# Patient Record
Sex: Female | Born: 1965 | Race: White | Hispanic: No | Marital: Single | Smoking: Current every day smoker
Health system: Southern US, Community
[De-identification: ages and names within clinical notes are randomized; demographics above are authoritative.]

## PROBLEM LIST (undated history)

## (undated) DIAGNOSIS — R296 Repeated falls: Secondary | ICD-10-CM

## (undated) DIAGNOSIS — R768 Other specified abnormal immunological findings in serum: Secondary | ICD-10-CM

## (undated) DIAGNOSIS — K760 Fatty (change of) liver, not elsewhere classified: Secondary | ICD-10-CM

## (undated) DIAGNOSIS — F319 Bipolar disorder, unspecified: Secondary | ICD-10-CM

## (undated) DIAGNOSIS — F1020 Alcohol dependence, uncomplicated: Secondary | ICD-10-CM

## (undated) DIAGNOSIS — B1920 Unspecified viral hepatitis C without hepatic coma: Secondary | ICD-10-CM

## (undated) DIAGNOSIS — D649 Anemia, unspecified: Secondary | ICD-10-CM

## (undated) DIAGNOSIS — E46 Unspecified protein-calorie malnutrition: Secondary | ICD-10-CM

## (undated) DIAGNOSIS — F1910 Other psychoactive substance abuse, uncomplicated: Secondary | ICD-10-CM

## (undated) DIAGNOSIS — J449 Chronic obstructive pulmonary disease, unspecified: Secondary | ICD-10-CM

## (undated) HISTORY — PX: TUBAL LIGATION: SHX77

---

## 1997-08-01 ENCOUNTER — Inpatient Hospital Stay (HOSPITAL_COMMUNITY): Admission: AD | Admit: 1997-08-01 | Discharge: 1997-08-01 | Payer: Self-pay | Admitting: *Deleted

## 1997-10-07 ENCOUNTER — Ambulatory Visit (HOSPITAL_COMMUNITY): Admission: RE | Admit: 1997-10-07 | Discharge: 1997-10-07 | Payer: Self-pay | Admitting: *Deleted

## 1998-03-20 ENCOUNTER — Inpatient Hospital Stay (HOSPITAL_COMMUNITY): Admission: AD | Admit: 1998-03-20 | Discharge: 1998-03-22 | Payer: Self-pay | Admitting: *Deleted

## 1998-04-25 DIAGNOSIS — F1910 Other psychoactive substance abuse, uncomplicated: Secondary | ICD-10-CM

## 1998-04-25 DIAGNOSIS — F1020 Alcohol dependence, uncomplicated: Secondary | ICD-10-CM

## 1998-04-25 HISTORY — DX: Alcohol dependence, uncomplicated: F10.20

## 1998-04-25 HISTORY — DX: Other psychoactive substance abuse, uncomplicated: F19.10

## 1998-09-26 ENCOUNTER — Emergency Department (HOSPITAL_COMMUNITY): Admission: EM | Admit: 1998-09-26 | Discharge: 1998-09-26 | Payer: Self-pay | Admitting: Emergency Medicine

## 1998-10-13 ENCOUNTER — Emergency Department (HOSPITAL_COMMUNITY): Admission: EM | Admit: 1998-10-13 | Discharge: 1998-10-13 | Payer: Self-pay | Admitting: Emergency Medicine

## 1999-04-13 ENCOUNTER — Emergency Department (HOSPITAL_COMMUNITY): Admission: EM | Admit: 1999-04-13 | Discharge: 1999-04-13 | Payer: Self-pay | Admitting: *Deleted

## 1999-04-13 ENCOUNTER — Encounter: Payer: Self-pay | Admitting: Emergency Medicine

## 2002-10-28 ENCOUNTER — Emergency Department (HOSPITAL_COMMUNITY): Admission: EM | Admit: 2002-10-28 | Discharge: 2002-10-28 | Payer: Self-pay | Admitting: Emergency Medicine

## 2002-10-28 ENCOUNTER — Encounter: Payer: Self-pay | Admitting: Emergency Medicine

## 2002-11-18 ENCOUNTER — Emergency Department (HOSPITAL_COMMUNITY): Admission: EM | Admit: 2002-11-18 | Discharge: 2002-11-18 | Payer: Self-pay

## 2003-01-26 ENCOUNTER — Emergency Department (HOSPITAL_COMMUNITY): Admission: EM | Admit: 2003-01-26 | Discharge: 2003-01-26 | Payer: Self-pay | Admitting: Emergency Medicine

## 2003-06-06 ENCOUNTER — Emergency Department (HOSPITAL_COMMUNITY): Admission: EM | Admit: 2003-06-06 | Discharge: 2003-06-06 | Payer: Self-pay | Admitting: Emergency Medicine

## 2003-06-24 ENCOUNTER — Encounter (INDEPENDENT_AMBULATORY_CARE_PROVIDER_SITE_OTHER): Payer: Self-pay | Admitting: Specialist

## 2003-06-24 ENCOUNTER — Ambulatory Visit (HOSPITAL_BASED_OUTPATIENT_CLINIC_OR_DEPARTMENT_OTHER): Admission: RE | Admit: 2003-06-24 | Discharge: 2003-06-24 | Payer: Self-pay | Admitting: Otolaryngology

## 2003-06-24 ENCOUNTER — Ambulatory Visit (HOSPITAL_COMMUNITY): Admission: RE | Admit: 2003-06-24 | Discharge: 2003-06-24 | Payer: Self-pay | Admitting: Otolaryngology

## 2003-06-24 HISTORY — PX: IRRIGATION AND DEBRIDEMENT SEBACEOUS CYST: SHX5255

## 2004-07-18 ENCOUNTER — Emergency Department (HOSPITAL_COMMUNITY): Admission: EM | Admit: 2004-07-18 | Discharge: 2004-07-18 | Payer: Self-pay | Admitting: Emergency Medicine

## 2004-08-20 ENCOUNTER — Inpatient Hospital Stay (HOSPITAL_COMMUNITY): Admission: EM | Admit: 2004-08-20 | Discharge: 2004-08-23 | Payer: Self-pay | Admitting: Psychiatry

## 2004-08-20 ENCOUNTER — Ambulatory Visit: Payer: Self-pay | Admitting: *Deleted

## 2005-03-04 ENCOUNTER — Emergency Department (HOSPITAL_COMMUNITY): Admission: EM | Admit: 2005-03-04 | Discharge: 2005-03-04 | Payer: Self-pay | Admitting: Family Medicine

## 2005-04-19 ENCOUNTER — Emergency Department (HOSPITAL_COMMUNITY): Admission: EM | Admit: 2005-04-19 | Discharge: 2005-04-20 | Payer: Self-pay | Admitting: Emergency Medicine

## 2005-05-11 ENCOUNTER — Emergency Department (HOSPITAL_COMMUNITY): Admission: EM | Admit: 2005-05-11 | Discharge: 2005-05-11 | Payer: Self-pay | Admitting: Family Medicine

## 2005-07-28 ENCOUNTER — Emergency Department (HOSPITAL_COMMUNITY): Admission: EM | Admit: 2005-07-28 | Discharge: 2005-07-28 | Payer: Self-pay | Admitting: Family Medicine

## 2005-09-21 ENCOUNTER — Inpatient Hospital Stay (HOSPITAL_COMMUNITY): Admission: RE | Admit: 2005-09-21 | Discharge: 2005-09-25 | Payer: Self-pay | Admitting: *Deleted

## 2005-09-22 ENCOUNTER — Ambulatory Visit: Payer: Self-pay | Admitting: *Deleted

## 2005-10-07 ENCOUNTER — Emergency Department (HOSPITAL_COMMUNITY): Admission: EM | Admit: 2005-10-07 | Discharge: 2005-10-07 | Payer: Self-pay | Admitting: Family Medicine

## 2005-11-16 ENCOUNTER — Inpatient Hospital Stay (HOSPITAL_COMMUNITY): Admission: RE | Admit: 2005-11-16 | Discharge: 2005-11-22 | Payer: Self-pay | Admitting: Psychiatry

## 2005-11-17 ENCOUNTER — Ambulatory Visit: Payer: Self-pay | Admitting: Psychiatry

## 2005-11-20 ENCOUNTER — Encounter: Payer: Self-pay | Admitting: Emergency Medicine

## 2006-01-25 ENCOUNTER — Emergency Department (HOSPITAL_COMMUNITY): Admission: EM | Admit: 2006-01-25 | Discharge: 2006-01-26 | Payer: Self-pay | Admitting: Emergency Medicine

## 2006-01-26 ENCOUNTER — Emergency Department (HOSPITAL_COMMUNITY): Admission: EM | Admit: 2006-01-26 | Discharge: 2006-01-27 | Payer: Self-pay | Admitting: Emergency Medicine

## 2007-10-03 ENCOUNTER — Emergency Department (HOSPITAL_COMMUNITY): Admission: EM | Admit: 2007-10-03 | Discharge: 2007-10-03 | Payer: Self-pay | Admitting: Emergency Medicine

## 2008-01-14 ENCOUNTER — Other Ambulatory Visit: Payer: Self-pay | Admitting: Emergency Medicine

## 2008-01-14 ENCOUNTER — Inpatient Hospital Stay (HOSPITAL_COMMUNITY): Admission: AD | Admit: 2008-01-14 | Discharge: 2008-01-17 | Payer: Self-pay | Admitting: Psychiatry

## 2008-01-14 ENCOUNTER — Ambulatory Visit: Payer: Self-pay | Admitting: Psychiatry

## 2008-04-04 ENCOUNTER — Emergency Department (HOSPITAL_COMMUNITY): Admission: EM | Admit: 2008-04-04 | Discharge: 2008-04-04 | Payer: Self-pay | Admitting: Emergency Medicine

## 2008-04-25 DIAGNOSIS — F319 Bipolar disorder, unspecified: Secondary | ICD-10-CM

## 2008-04-25 HISTORY — DX: Bipolar disorder, unspecified: F31.9

## 2008-05-13 ENCOUNTER — Emergency Department (HOSPITAL_COMMUNITY): Admission: EM | Admit: 2008-05-13 | Discharge: 2008-05-13 | Payer: Self-pay | Admitting: Emergency Medicine

## 2008-10-26 ENCOUNTER — Encounter: Payer: Self-pay | Admitting: Emergency Medicine

## 2008-10-27 ENCOUNTER — Observation Stay (HOSPITAL_COMMUNITY): Admission: EM | Admit: 2008-10-27 | Discharge: 2008-10-28 | Payer: Self-pay | Admitting: Emergency Medicine

## 2008-11-07 ENCOUNTER — Emergency Department (HOSPITAL_COMMUNITY): Admission: EM | Admit: 2008-11-07 | Discharge: 2008-11-07 | Payer: Self-pay | Admitting: Emergency Medicine

## 2009-02-05 ENCOUNTER — Emergency Department (HOSPITAL_COMMUNITY): Admission: EM | Admit: 2009-02-05 | Discharge: 2009-02-05 | Payer: Self-pay | Admitting: Emergency Medicine

## 2009-03-09 ENCOUNTER — Emergency Department (HOSPITAL_COMMUNITY): Admission: EM | Admit: 2009-03-09 | Discharge: 2009-03-10 | Payer: Self-pay | Admitting: Emergency Medicine

## 2010-04-25 DIAGNOSIS — E46 Unspecified protein-calorie malnutrition: Secondary | ICD-10-CM

## 2010-04-25 HISTORY — DX: Unspecified protein-calorie malnutrition: E46

## 2010-06-15 ENCOUNTER — Emergency Department (HOSPITAL_COMMUNITY)
Admission: EM | Admit: 2010-06-15 | Discharge: 2010-06-15 | Disposition: A | Discharge status: Home / Self Care | Payer: Self-pay | Attending: Emergency Medicine | Admitting: Emergency Medicine

## 2010-06-15 DIAGNOSIS — F1010 Alcohol abuse, uncomplicated: Secondary | ICD-10-CM | POA: Insufficient documentation

## 2010-07-28 LAB — URINALYSIS, ROUTINE W REFLEX MICROSCOPIC
Bilirubin Urine: NEGATIVE
Ketones, ur: NEGATIVE mg/dL
Nitrite: NEGATIVE
Urobilinogen, UA: 0.2 mg/dL (ref 0.0–1.0)

## 2010-07-28 LAB — DIFFERENTIAL
Basophils Relative: 2 % — ABNORMAL HIGH (ref 0–1)
Eosinophils Absolute: 0.4 10*3/uL (ref 0.0–0.7)
Lymphs Abs: 4.2 10*3/uL — ABNORMAL HIGH (ref 0.7–4.0)
Neutrophils Relative %: 38 % — ABNORMAL LOW (ref 43–77)

## 2010-07-28 LAB — BASIC METABOLIC PANEL
BUN: 3 mg/dL — ABNORMAL LOW (ref 6–23)
Calcium: 9.6 mg/dL (ref 8.4–10.5)
Chloride: 100 mEq/L (ref 96–112)
Creatinine, Ser: 0.68 mg/dL (ref 0.4–1.2)

## 2010-07-28 LAB — CBC
MCV: 96.6 fL (ref 78.0–100.0)
Platelets: 278 10*3/uL (ref 150–400)
WBC: 9.7 10*3/uL (ref 4.0–10.5)

## 2010-07-28 LAB — ETHANOL
Alcohol, Ethyl (B): 135 mg/dL — ABNORMAL HIGH (ref 0–10)
Alcohol, Ethyl (B): 351 mg/dL — ABNORMAL HIGH (ref 0–10)

## 2010-07-28 LAB — RAPID URINE DRUG SCREEN, HOSP PERFORMED
Amphetamines: NOT DETECTED
Benzodiazepines: NOT DETECTED
Cocaine: NOT DETECTED
Tetrahydrocannabinol: NOT DETECTED

## 2010-07-28 LAB — URINE CULTURE

## 2010-07-29 LAB — COMPREHENSIVE METABOLIC PANEL WITH GFR
ALT: 32 U/L (ref 0–35)
BUN: 4 mg/dL — ABNORMAL LOW (ref 6–23)
CO2: 27 meq/L (ref 19–32)
Calcium: 8.6 mg/dL (ref 8.4–10.5)
GFR calc non Af Amer: >60 mL/min (ref >60)
Glucose, Bld: 134 mg/dL — ABNORMAL HIGH (ref 70–99)
Sodium: 128 meq/L — ABNORMAL LOW (ref 135–145)
Total Protein: 7.6 g/dL (ref 6.0–8.3)

## 2010-07-29 LAB — DIFFERENTIAL
Basophils Absolute: 0.1 10*3/uL (ref 0.0–0.1)
Basophils Relative: 1 % (ref 0–1)
Eosinophils Absolute: 0 K/uL (ref 0.0–0.7)
Eosinophils Relative: 0 % (ref 0–5)
Lymphocytes Relative: 5 % — ABNORMAL LOW (ref 12–46)
Lymphs Abs: 0.8 K/uL (ref 0.7–4.0)
Monocytes Absolute: 1.5 10*3/uL — ABNORMAL HIGH (ref 0.1–1.0)
Monocytes Relative: 8 % (ref 3–12)
Neutro Abs: 16.1 K/uL — ABNORMAL HIGH (ref 1.7–7.7)
Neutrophils Relative %: 87 % — ABNORMAL HIGH (ref 43–77)

## 2010-07-29 LAB — URINALYSIS, ROUTINE W REFLEX MICROSCOPIC
Ketones, ur: 15 mg/dL — AB
Nitrite: POSITIVE — AB
Protein, ur: 100 mg/dL — AB
pH: 6 (ref 5.0–8.0)

## 2010-07-29 LAB — COMPREHENSIVE METABOLIC PANEL
AST: 34 U/L (ref 0–37)
Albumin: 3.3 g/dL — ABNORMAL LOW (ref 3.5–5.2)
Alkaline Phosphatase: 83 U/L (ref 39–117)
Chloride: 91 mEq/L — ABNORMAL LOW (ref 96–112)
Creatinine, Ser: 0.85 mg/dL (ref 0.4–1.2)
GFR calc Af Amer: >60 mL/min (ref >60)
Potassium: 3.7 mEq/L (ref 3.5–5.1)
Total Bilirubin: 0.9 mg/dL (ref 0.3–1.2)

## 2010-07-29 LAB — CBC
HCT: 41.3 % (ref 36.0–46.0)
Hemoglobin: 14.2 g/dL (ref 12.0–15.0)
MCHC: 34.4 g/dL (ref 30.0–36.0)
MCV: 94.9 fL (ref 78.0–100.0)
Platelets: 271 10*3/uL (ref 150–400)
RBC: 4.35 MIL/uL (ref 3.87–5.11)
RDW: 17 % — ABNORMAL HIGH (ref 11.5–15.5)
WBC: 18.5 10*3/uL — ABNORMAL HIGH (ref 4.0–10.5)

## 2010-07-29 LAB — URINE CULTURE: Colony Count: >=100000

## 2010-07-29 LAB — PREGNANCY, URINE: Preg Test, Ur: NEGATIVE

## 2010-07-29 LAB — WET PREP, GENITAL
Clue Cells Wet Prep HPF POC: NONE SEEN
Trich, Wet Prep: NONE SEEN
Yeast Wet Prep HPF POC: NONE SEEN

## 2010-07-29 LAB — GC/CHLAMYDIA PROBE AMP, GENITAL
Chlamydia, DNA Probe: NEGATIVE
GC Probe Amp, Genital: NEGATIVE

## 2010-07-29 LAB — LIPASE, BLOOD: Lipase: 28 U/L (ref 11–59)

## 2010-08-01 LAB — DIFFERENTIAL
Basophils Absolute: 0 10*3/uL (ref 0.0–0.1)
Basophils Relative: 1 % (ref 0–1)
Eosinophils Absolute: 0.2 K/uL (ref 0.0–0.7)
Eosinophils Relative: 2 % (ref 0–5)
Lymphocytes Relative: 35 % (ref 12–46)
Lymphs Abs: 2.6 K/uL (ref 0.7–4.0)
Monocytes Absolute: 0.6 10*3/uL (ref 0.1–1.0)
Monocytes Relative: 8 % (ref 3–12)
Neutro Abs: 4.2 10*3/uL (ref 1.7–7.7)
Neutrophils Relative %: 55 % (ref 43–77)

## 2010-08-01 LAB — COMPREHENSIVE METABOLIC PANEL
ALT: 64 U/L — ABNORMAL HIGH (ref 0–35)
AST: 157 U/L — ABNORMAL HIGH (ref 0–37)
Albumin: 3.6 g/dL (ref 3.5–5.2)
CO2: 23 mEq/L (ref 19–32)
Chloride: 107 mEq/L (ref 96–112)
GFR calc Af Amer: >60 mL/min (ref >60)
GFR calc non Af Amer: >60 mL/min (ref >60)
Potassium: 3.4 mEq/L — ABNORMAL LOW (ref 3.5–5.1)
Sodium: 141 mEq/L (ref 135–145)
Total Bilirubin: 0.3 mg/dL (ref 0.3–1.2)

## 2010-08-01 LAB — CBC
HCT: 26.3 % — ABNORMAL LOW (ref 36.0–46.0)
HCT: 31.8 % — ABNORMAL LOW (ref 36.0–46.0)
HCT: 43.4 % (ref 36.0–46.0)
Hemoglobin: 10.6 g/dL — ABNORMAL LOW (ref 12.0–15.0)
Hemoglobin: 14.9 g/dL (ref 12.0–15.0)
MCHC: 33.3 g/dL (ref 30.0–36.0)
MCHC: 34.2 g/dL (ref 30.0–36.0)
MCV: 101.7 fL — ABNORMAL HIGH (ref 78.0–100.0)
MCV: 103.4 fL — ABNORMAL HIGH (ref 78.0–100.0)
Platelets: 114 10*3/uL — ABNORMAL LOW (ref 150–400)
Platelets: 144 K/uL — ABNORMAL LOW (ref 150–400)
Platelets: 166 10*3/uL (ref 150–400)
RBC: 3.07 MIL/uL — ABNORMAL LOW (ref 3.87–5.11)
RBC: 4.27 MIL/uL (ref 3.87–5.11)
RDW: 14.5 % (ref 11.5–15.5)
RDW: 14.6 % (ref 11.5–15.5)
WBC: 7.8 10*3/uL (ref 4.0–10.5)
WBC: 7.6 K/uL (ref 4.0–10.5)
WBC: 8.2 10*3/uL (ref 4.0–10.5)

## 2010-08-01 LAB — BASIC METABOLIC PANEL
Chloride: 107 mEq/L (ref 96–112)
GFR calc Af Amer: >60 mL/min (ref >60)
GFR calc non Af Amer: >60 mL/min (ref >60)
Potassium: 3.4 mEq/L — ABNORMAL LOW (ref 3.5–5.1)
Sodium: 142 mEq/L (ref 135–145)

## 2010-08-01 LAB — URINALYSIS, ROUTINE W REFLEX MICROSCOPIC
Bilirubin Urine: NEGATIVE
Glucose, UA: NEGATIVE mg/dL
Ketones, ur: NEGATIVE mg/dL
Leukocytes, UA: NEGATIVE
Nitrite: NEGATIVE
Protein, ur: NEGATIVE mg/dL
Specific Gravity, Urine: 1.03 (ref 1.005–1.030)
Urobilinogen, UA: 1 mg/dL (ref 0.0–1.0)
pH: 5.5 (ref 5.0–8.0)

## 2010-08-01 LAB — URINE MICROSCOPIC-ADD ON

## 2010-08-01 LAB — LIPASE, BLOOD: Lipase: 98 U/L — ABNORMAL HIGH (ref 11–59)

## 2010-08-01 LAB — BASIC METABOLIC PANEL WITH GFR
BUN: 5 mg/dL — ABNORMAL LOW (ref 6–23)
CO2: 24 meq/L (ref 19–32)
Calcium: 8.3 mg/dL — ABNORMAL LOW (ref 8.4–10.5)
Creatinine, Ser: 0.85 mg/dL (ref 0.4–1.2)
Glucose, Bld: 98 mg/dL (ref 70–99)

## 2010-08-01 LAB — COMPREHENSIVE METABOLIC PANEL WITH GFR
Alkaline Phosphatase: 60 U/L (ref 39–117)
BUN: 7 mg/dL (ref 6–23)
Calcium: 8.2 mg/dL — ABNORMAL LOW (ref 8.4–10.5)
Creatinine, Ser: 0.85 mg/dL (ref 0.4–1.2)
Glucose, Bld: 99 mg/dL (ref 70–99)
Total Protein: 6.9 g/dL (ref 6.0–8.3)

## 2010-08-01 LAB — ETHANOL: Alcohol, Ethyl (B): 419 mg/dL (ref 0–10)

## 2010-08-01 LAB — SAMPLE TO BLOOD BANK

## 2010-08-01 LAB — POCT PREGNANCY, URINE: Preg Test, Ur: NEGATIVE

## 2010-08-09 LAB — URINALYSIS, ROUTINE W REFLEX MICROSCOPIC
Glucose, UA: NEGATIVE mg/dL
Specific Gravity, Urine: 1.023 (ref 1.005–1.030)
pH: 6 (ref 5.0–8.0)

## 2010-08-09 LAB — URINE MICROSCOPIC-ADD ON

## 2010-08-09 LAB — URINE CULTURE

## 2010-09-07 NOTE — H&P (Signed)
NAMEGLYN, Debbie Grimes NO.:  [id number]   MEDICAL RECORD NO.:  [id number]          PATIENT TYPE:  IPS   LOCATION:  0505                          FACILITY:  [facility]   PHYSICIAN:  Geoffery Lyons, M.D.      DATE OF BIRTH:  1965-07-06   DATE OF ADMISSION:  01/14/2008  DATE OF DISCHARGE:                       PSYCHIATRIC ADMISSION ASSESSMENT   This is a voluntary admission to the services of Dr. Geoffery Lyons.   IDENTIFYING INFORMATION:  This is a 45 year old single white female who  appears far older than her stated age.  Debbie Grimes was medically  cleared in the emergency department at [facility] yesterday.  She  presented here at the suggestion of the mental health court for detox.  She admits she drinks up to six 40 ounce beers a day.  Her last drink  was yesterday morning.  Yesterday in the emergency department, her  alcohol level was 40.  She admitted shakiness and nausea, but she was  able to eat.  She does have some history for auditory and visual  hallucinations, especially when withdrawing, and because of some history  of DTs, it was felt that hospitalization for supervised detox was  recommended.  She denies having had issues with detox earlier this year.  She denies being suicidal or homicidal and wants treatment to get  better for her daughter.  This would be her 28-year-old who lives at  home with her boyfriend, who is the girl's father.  She also has another  daughter who is 50.   PAST PSYCHIATRIC HISTORY:  She was last with [place] from July 25 through  November 22, 2005 for the same thing.  She had a detox earlier in May of  that year but has not been here in a while.   Five weeks ago, she started at drug court.  She was released from  prison.  She is unclear of the actual date.  She was in prison for a  DUI.  She is currently on probation for assault and battery.  She does  have a Arts administrator.   FAMILY HISTORY:  Initially she denied having any family  history for  alcoholism; however, it develops that her father was an alcoholic and  her mother used to drink, but her mother is deceased.  She died several  months ago.   ALCOHOL/DRUG HISTORY:  Her own alcohol and drug history is extensive.  She began drinking at age 11, and she is currently drinking up to six 40  ounce beers a day, despite having been imprisoned for a DUI.   PRIMARY CARE Kinnedy Mongiello:  She does not have one.   She reports that she had started at [facility];  however, she has not been there for several months.  She was prescribed  Campral 666 mg p.o. t.i.d., Prozac 20, and trazodone at 50-100 nightly.   MEDICAL PROBLEMS:  She does not have any known problems; however, during  her medical clearance, it was noted that she has Trichomonas in her  urine.  Will be treating her with Flagyl 2 gm today and will  review her  for any other STDs.  Her labs were surprisingly otherwise reasonable.  She was not anemic.  She did have a UTI going on, leukocyte esterase  moderate.  Her alcohol level was 40.  Her UDS was negative.  She had no  other drugs present.  Her sodium was slightly low at 134.   PHYSICAL EXAMINATION:  As already stated, she looks quite a bit older  than her stated age.  This is due to her alcohol and smoking.  VITAL SIGNS ON ADMISSION:  She is 66.5 inches tall.  She weighs 111.  Her temperature is 98.5, blood pressure 134/80 to 138/84, pulse 60,  respirations 14.  She does have a number of tattoos, please see anatomic  drawing for descriptions and locations.  MENTAL STATUS:  Tonight she is alert and oriented.  She was casually  dressed.  She was somewhat still unkempt.  She appeared to be  inadequately nourished.  Her speech is normal in rate, rhythm, and tone.  Her mood:  She becomes cheerful when explaining to her that she does  have an STD.  Her affect was labile.  Her thought processes are clear,  rational, and goal-oriented.  She wants to  get treated.  Judgment and  insight are fair.  Concentration and memory are intact.  Intelligence is  average.  She denies being suicidal or homicidal.  She does occasionally  see things run across the floor or hear voices.   DIAGNOSES:   AXIS I:  1. Alcohol dependence.  2. Mood disorder, not otherwise specified.   AXIS II:  Deferred.   AXIS III:  None.   AXIS IV:  Severe.  Legal.  Psychosocial.  Noncompliance.   AXIS V:  35.   PLAN:  Admit for detoxification.  Towards that end, she was started on a  low-dose librium protocol.  She has discussed the possibility of using  Antabuse with Dr. Dub Mikes.  She will be treated for her Trichomonas with  Flagyl 2 gm, a one-time dose.  She will start a three-day course of  Cipro for her UTI.  We will rule out other sexually transmitted  diseases.   Estimated length of stay is 3-5 days.      Debbie Grimes, P.A.-C.      Geoffery Lyons, M.D.  Electronically Signed    MD/MEDQ  D:  01/15/2008  T:  01/15/2008  Job:  [id number]
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
## 2010-09-10 NOTE — Discharge Summary (Signed)
Debbie, Grimes NO.:  [id number]   MEDICAL RECORD NO.:  [id number]          PATIENT TYPE:  IPS   LOCATION:  0300                          [facility]ACILITY:  [facility]   PHYSICIAN:  Geoffery Lyons, M.D.      DATE OF BIRTH:  [birth year]   DATE OF ADMISSION:  11/16/2005  DATE OF DISCHARGE:  11/22/2005                                 DISCHARGE SUMMARY   CHIEF COMPLAINT AND PRESENT ILLNESS:  This was the second admission to [facility] for this 45 year old single white female voluntarily  admitted.  History of alcohol dependence.  Relapsed about three days after  her last admission June of 2007.  Drinking up to eight 40-ounce beers daily.  Last drink was November 16, 2005.  Drinking in the morning.  Recent DUI.  Depressed.  Denies any suicidal thoughts.  Lost about 15 pounds.  Denies any  drug use.  Wanted medication for alcohol cravings.   PAST PSYCHIATRIC HISTORY:  Originally discharged in June of 2007.  She was  to follow up in [facility].  She relapsed shortly after she left.   ALCOHOL/DRUG HISTORY:  As already stated, persistent use of alcohol.  No  other substances.   MEDICAL HISTORY:  Noncontributory.   MEDICATIONS:  Prozac 30 mg per day, Neurontin 300 mg three times a day,  trazodone 50 mg at night, Prilosec 20 mg per day.   PHYSICAL EXAMINATION:  Performed and failed to show any acute findings.   LABORATORY DATA:  Urine drug screen was negative.  SGOT 102, SGPT 57,  bilirubin 1.3.  Alcohol level 123.  Pregnancy test negative.  Hemoglobin 17.   MENTAL STATUS EXAM:  Cooperative female.  Good eye contact.  Appears older  than her calendar years.  Multiple tattoos.  Speech was clear, normal rate  and tone.  Feeling depressed.  Affect constricted.  Thought processes are  logical, coherent and relevant.  No evidence of psychosis.  Cognition was  well-preserved.   ADMISSION DIAGNOSES:  AXIS I:  Alcohol dependence.  Depressive disorder not  otherwise  specified.  AXIS II:  No diagnosis.  AXIS III:  No diagnosis.  AXIS IV:  Moderate.  AXIS V:  GAF upon admission 35; highest GAF in the last year 60.   HOSPITAL COURSE:  She was admitted.  She was started in individual and group  psychotherapy.  She was maintained on her medication.  She was detoxified  with Librium.  Given trazodone for sleep.  Eventually, she was placed on  Antabuse 250 mg.  Neurontin was placed at 400 mg three times a day and at  bedtime, Seroquel 25 mg as needed.  Prozac was increased to 40 mg.  She  endorsed that she relapsed three days after she left the unit.  Increased  use of alcohol.  Out of control.  Upset, depressed, shaky, unable to sleep,  anxious.  Wanting to quit.  We continued to work on the detox.  Endorsed she  was having a hard time with nerves.  Wanting medications for her nerves.  Felt that she was not  going to be able to make it.  Discussed the effect of  alcohol on her liver.  She was still pretty superficial.  Endorsed tremors,  shaking, anxiety.  She was able to tolerate the Neurontin.  We continued to  work with the Neurontin.  We pursued with the detox.  On November 21, 2005, she  felt she was not ready to go.  Was wanting to stay with the mother.  Did not  want to return back with the boyfriend.  She continued to get more stable.  On November 22, 2005, less depressed.  Denied any suicidal or homicidal  ideation.  Thoughts logical, coherent and relevant.  Endorsed conflict with  the boyfriend but she was not going to address this.  She was going to stay  with her mother and continue to work on herself and pursue long-term  abstinence.   DISCHARGE DIAGNOSES:  AXIS I:  Alcohol dependence.  Depressive disorder not  otherwise specified.  AXIS II:  No diagnosis.  AXIS III:  No diagnosis.  AXIS IV:  Moderate.  AXIS V:  GAF upon discharge 50.   DISCHARGE MEDICATIONS:  1. Neurontin 400 mg three times a day and at bedtime.  2. Antabuse 250 mg per day.   3. Prozac 40 mg per day.  4. Trazodone 100 mg at bedtime.   FOLLOWUP:  [facility].      Geoffery Lyons, M.D.  Electronically Signed     IL/MEDQ  D:  12/13/2005  T:  12/14/2005  Job:  [id number]

## 2010-09-10 NOTE — Discharge Summary (Signed)
Debbie Grimes, ALEN NO.:  [id number]   MEDICAL RECORD NO.:  [id number]          PATIENT TYPE:  IPS   LOCATION:  0506                          FACILITY:  [facility]   PHYSICIAN:  Geoffery Lyons, M.D.      DATE OF BIRTH:  May 19, 1965   DATE OF ADMISSION:  08/20/2004  DATE OF DISCHARGE:  08/23/2004                                 DISCHARGE SUMMARY   HISTORY OF PRESENT ILLNESS:  This is a voluntarily admission for the  services of Dr. Dub Mikes.  The patient is a 45 year old single Caucasian female  who looks much older than her stated age.  The patient apparently presented  to the emergency room earlier today at [facility] requesting detox from  alcohol.  Her initial alcohol level was 392.  It came down to 163.  Her  urine drug screen was also positive for cocaine.  She states that she cries  frequently.  She has occasional headaches.  She has not been employed for  the past year.  She wants to stay with her current boyfriend.  Her teeth  were pulled about three weeks ago.  She is completely edentulous.  She is  unusually thin.  Her skin is weathered and wrinkled and she looks like she  has had an unusually hard life.   PAST PSYCHIATRIC HISTORY:  The patient states six or seven years ago she had  one prior detoxification at [facility].  She has had no other psychiatric or  substance abuse treatment.   FAMILY HISTORY:  The patient denies.   SUBSTANCE ABUSE HISTORY:  The patient smokes two packs of cigarettes per  day.  She had been using alcohol since about the age of 61.  Normally, she  drinks three 40-ounce beers a day and occasionally does a half of a fifth of  wine.  She began using cocaine at age 80 and uses it every 3-4 months, about  $20 worth.  She is currently on probation for DWI.  She got that one year  ago. Her probation officer suggested that she go to detoxification and  finally stop drinking.   PAST MEDICAL HISTORY:  The patient states she does not have a primary  care  physician.  She goes to the emergency room.  Medical problems include no  known medical illnesses.   MEDICATIONS:  She is not prescribed any medicines.   ALLERGIES:  Denies any drug allergies.   PHYSICAL EXAMINATION:  The patient was medically cleared in the emergency  room.  Her exam is as per the chart.  However, physically, she was  remarkable for being edentulous and not compensated.   HOSPITAL COURSE:  Upon admission, the patient was placed on a low-dose  Librium protocol.  She was also placed on Campral 333 mg t.i.d. and Remeron  15 mg q.h.s.  On August 21, 2004, she was placed on fluoxetine 20 mg q.d.  The patient tolerated her detoxification well.  She did become anxious and  signed a 72-hour request for discharge but was told that she probably would  not be there that long anyway.  She states she missed her daughter.  She was  very cooperative, however, and wanted to recover.  She was able to  participate appropriately in unit groups and therapies.  At the time of  discharge, her mental status exam had improved.  She was still depressed but  less so than upon admission.  Affect was wider-range, especially when  talking about seeing her daughter.  There was no suicidal or homicidal  ideation.  No psychosis.  Thoughts were logical and goal directed.  Thought  content with no predominate theme.  She was willing to go to the [facility] on an outpatient basis.   DISCHARGE DIAGNOSES:   AXIS I:  Alcohol dependence.   AXIS II:  Deferred.   AXIS III:  1.  Edentulous.  2.  Poor nutrition.   AXIS IV:  Severe (economic problems).   AXIS V:  Global Assessment of Functioning 25 upon admission; 60 highest past  year; 55 at discharge.   POST-HOSPITAL CARE PLANS:  There were no restrictions on diet or activity.   DISCHARGE MEDICATIONS:  1.  Campral 333 mg, 1 t.i.d.  2.  Remeron 15 mg, 1 q.h.s.  3.  Prozac 20 mg, 1 q.d.   FOLLOW UP:  She was referred for follow-up  with the [facility] and agreed to continue treatment.  Apparently, the [facility] did not  take her insurance.  She was willing to go to the [facility]  and told how to access this.  At the time of discharge, she was safe to be  in a lesser restrictive setting.      BHS/MEDQ  D:  09/01/2004  T:  09/02/2004  Job:  [id number]

## 2010-09-10 NOTE — H&P (Signed)
Debbie Grimes, Debbie Grimes NO.:  [id number]   MEDICAL RECORD NO.:  [id number]          PATIENT TYPE:  IPS   LOCATION:  0506                          FACILITY:  [facility]   PHYSICIAN:  Geoffery Lyons, M.D.      DATE OF BIRTH:  01/19/1966   DATE OF ADMISSION:  08/20/2004  DATE OF DISCHARGE:                         PSYCHIATRIC ADMISSION ASSESSMENT   IDENTIFYING INFORMATION:  This is a voluntary admission to the services of  Dr. Geoffery Lyons.  This is a 45 year old single white female who looks much  older than her stated age.   REASON FOR ADMISSION AND SYMPTOMS:  Apparently, she presented to the  emergency room earlier today at [facility] requesting detoxification from  alcohol.  Indeed, her initial alcohol level was 392.  It came down to 163.  Her urine drug screen was also positive for cocaine.  She states that she  cries frequently.  She has occasional headaches.  She has not been employed  the past year.  She wants to stay with her current boyfriend.  Her teeth  were pulled about three weeks ago.  She is completely edentulous.  She is  unusually thin.  Her skin is weathered and wrinkled and she just looks like  she has had an unusually hard life.   PAST PSYCHIATRIC HISTORY:  She states that maybe six or seven years ago she  had one prior detoxification at [facility].  She has had no other treatment.   SOCIAL HISTORY:  She completed ninth grade.  She has worked at [employer],  packing, Catering manager.  She has two children, a daughter age 53 who does not live  with her and a 44-year-old daughter who does live with her.  That child is  currently with her sister while she is in the hospital.   FAMILY HISTORY:  She denies.   ALCOHOL/DRUG HISTORY:  She smokes two packs of cigarettes per day.  She has  been using alcohol since about age 29.  Normally, she drinks three 40-ounce  beers a day and occasionally she does a half to a fifth of wine.  Cocaine  she began using at age 39 and she uses it every  3-4 months, about $20 worth.  She is currently on probation for a DWI.  She got that one year ago.  Her  probation officer suggested she go to detoxification and finally stop  drinking.   PRIMARY CARE PHYSICIAN:  She states she does not have one.  She just goes to  the emergency room.   MEDICAL PROBLEMS:  She has no known medical illnesses.   MEDICATIONS:  She is not prescribed any medications.   ALLERGIES:  She denies any drug allergies.   POSITIVE PHYSICAL FINDINGS:  She was medically cleared in the ER.  Her exam  is as per the chart.  However, it is remarkable for her being edentulous and  not compensated.   MENTAL STATUS EXAM:  She is alert and oriented x 3.  She is casually groomed  and dressed.  She is in the hospital attire and she is inadequately  nourished.  Her  speech has a normal rate, rhythm and tone.  Her motor and  gait are normal.  She has good eye contact.  Her mood is depressed and  anxious.  Her affect is congruent.  Her thought processes are clear,  rational and goal-oriented.  She wants to resume care of her 78-year-old and  have her 66-year-old know her as a sober person.  Judgment and insight are  fair.  Concentration and memory are intact.  Intelligence is average.  She  denies suicidal or homicidal ideation.  She denies auditory or visual  hallucinations.   DIAGNOSES:   AXIS I:  1.  Alcohol dependence.  She is here for withdrawal.  2.  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   AXIS II:   AXIS III:  Malnutrition.   AXIS IV:  Severe (she has problems with her primary support group, with  economics and she is currently on probation.  She is next due to see her  probation officer on May 17th).   AXIS V:   PLAN:  To admit for detoxification and support through detoxification from  alcohol.  Toward that end, she was started on the low-dose Librium protocol.  As she is depressed and crying and having issues with sleep, we will give  her some  Remeron.  We will start 15 mg q.h.s. starting tonight.  To help her  be able to continue her abstinence from alcohol, we will start her on  Campral 333 mg t.i.d. beginning tonight.      MD/MEDQ  D:  08/20/2004  T:  08/20/2004  Job:  [id number]

## 2010-09-10 NOTE — Discharge Summary (Signed)
NAMEMAZELL, Debbie Grimes NO.:  [id number]   MEDICAL RECORD NO.:  [id number]          PATIENT TYPE:  IPS   LOCATION:  0505                          FACILITY:  [facility]   PHYSICIAN:  Geoffery Lyons, M.D.      DATE OF BIRTH:  October 28, 1965   DATE OF ADMISSION:  01/14/2008  DATE OF DISCHARGE:  01/17/2008                               DISCHARGE SUMMARY   CHIEF COMPLAINT:  This was the third admission to [facility] for this 45 year old single white female who appeared older than  her stated age.  Medically cleared in the emergency room.  Presented at  suggestion of [facility] for detox.  Drinks up to six 40 ounce  beers a day, 3 the day before.  She admitted to shakiness and nausea.  She admits to some history of auditory and visual hallucinations when  withdrawing.  History of DTs and she wanted treatment to get her life  together, get better for her daughter.  Has a 64-year-old daughter.  Lives presently with her boyfriend, who is the girl's father.  She has  another daughter who is 66.   PAST MEDICAL HISTORY:  She was last admitted July 25th through July 31st  2007.   SECONDARY HISTORY:  Extensive history of alcohol and drug use began  drinking age 77, drinking up to six 40 ounces beers a day.  Has been in  prison for DUI.  On probation for assault and battery, behaviors that  are alcohol influenced.  Five weeks prior to this admission she started  at drug court.   MEDICAL HISTORY:  Trichomonas treated with Flagyl.  TSH 1.316.  UA  leukocyte esterase large, bacteria many.  RPR nonreactive.  Chlamydia  probe negative.  UDS was negative for substances of abuse.   PHYSICAL EXAMINATION:  Physical exam failed to show any acute findings.  Reveals alert cooperative female casually dressed, somewhat unkempt.  Looks malnourished.  Speech was normal in rate, rhythm and tone.  Mood  is down, depressed.  Affect is labile.  Thought processes are clear,  rational and goal-oriented.  Endorsed she wanted to get treated, wanted  to get better.  No active suicidal or homicidal ideas, no delusions.  No  hallucinations.  Cognition well preserved.   DIAGNOSES:  AXIS I:  Alcohol dependence.  Mood disorder not otherwise  specified.  AXIS II:  No diagnosis.  AXIS III:  No diagnosis.  AXIS IV:  Moderate.  AXIS V:  On admission 35, highest GAF in the last year 60.   COURSE IN THE HOSPITAL:  She was admitted, started individual and group  psychotherapy.  She was given Cipro for her urinary tract infection and  Flagyl for the Trichomonas.  She was detoxified with Librium.  As  already stated, drinking six to seven 40 ounce beers every day.  One  week abstinence.  Got really shaky, nauseated.  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  Takes care of her 73 year old sister who has mental  retardation.  Has a 50-year-old daughter, and a 40 year old daughter in  [place].  Lives with her boyfriend and daughter, who is the boyfriend's  daughter.  Past psych was in the outpatient clinic at [facility].  Has been on had been on can Campral, Flexeril,  Trazodone.  [facility] charged with assault and battery.  Has  been placed on probation, 2 DWI, also admits that she drinks 2 fifths of  liquor with the beer.  September 23 endorsed she was really committed to  abstinence.  Wanted to pursue outpatient treatment.  Willing to try  Antabuse.  Whatever she needed to do to keep herself from relapsing.  September 24, she endorsed she was ready to go.  No active suicidal or  homicidal ideas, no hallucinations or delusions.  Fully detoxed.  We  went ahead and discharged to outpatient followup.   DISCHARGE DIAGNOSES:  AXIS I:  Alcohol dependence, status post alcohol  withdrawal.  Mood disorder not otherwise specified.  AXIS II:  No diagnosis.  AXIS III:  Urinary tract infection treated, trichomoniasis treated.  AXIS IV:  Moderate.  AXIS V:  On  discharge 50-55.  Discharged on Campral 333 mg 2 to 3 times  a day, Cipro 500 mg 1 twice a day for 2 more days, trazodone 100 mg at  bedtime for sleep, and Antabuse 250 mg per day.      Geoffery Lyons, M.D.  Electronically Signed     IL/MEDQ  D:  01/30/2008  T:  01/31/2008  Job:  [id number]

## 2010-09-10 NOTE — H&P (Signed)
NAMEKERRI-ANNE, HAEBERLE NO.:  [id number]   MEDICAL RECORD NO.:  [id number]          PATIENT TYPE:  IPS   LOCATION:  0306                          FACILITY:  [facility]   PHYSICIAN:  Jasmine Pang, M.D. DATE OF BIRTH:  [birth year]   DATE OF ADMISSION:  09/21/2005  DATE OF DISCHARGE:                         PSYCHIATRIC ADMISSION ASSESSMENT   IDENTIFYING INFORMATION:  This is a 45 year old single white female.  This  is a voluntary admission.   HISTORY OF PRESENT ILLNESS:  This patient presented in the emergency room at  the urging of her mother, requesting detox.  Her mother takes care of her 78-  year-old child and wants the patient to clean up and maintain sobriety.  The  patient herself admits that she is addicted to alcohol, has a lot of guilt  and shame about her drinking, has been drinking since age 70 with longest  period sober 2 weeks a couple of years ago.  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  She was also  positive for cocaine but has no memory of using any of this.  She does admit  to having frequent blackouts and amnestic periods due to drinking.  She says  that she drinks every day, gets shaky and sweaty at night and wakes up,  needing to have a drink.  Has been drinking approximately 9-12 40-ounce  beers every single day.  She denies having any legal charges related to the  alcohol intake.  She admits to depression, is quite tearful, admits to  having dysphoric mood, depressed, no active suicidal ideation, at times is  so ashamed of herself that she wishes she was dead, no homicidal thought, no  history of psychosis or suicide attempts.   PAST PSYCHIATRIC HISTORY:  The patient is followed by [facility]  currently and has been going there for 2 years, recently relapsed again on  alcohol, feels unable to stay away from it, not sure what her triggers were,  does endorse having a depressed mood, was treated  previously a couple of  years ago with Prozac which she felt helped her quite a bit, would also like  to try medications to help her avoid alcohol but has been unable to try any  of these medicines at this point  No history of psychosis, suicidal or  homicidal ideation.   SOCIAL HISTORY:  The patient reports that she was educated in special  education classes, basic education, is Conservator, museum/gallery, recently  lost her job.  Employed is willing to give her job back if she will clean up  and get sober.  The patient has a 5-year-old daughter who is cared for by  her mother.  She currently lives her boyfriend.   FAMILY HISTORY:  Remarkable for some history of alcohol abuse in her  parents.   ALCOHOL AND DRUG HISTORY:  Alcohol and drug history is noted above.  She  denies any other substance abuse.   PAST MEDICAL HISTORY:  The patient has no regular primary care Shirlee [facility].  Medical problems include tobacco abuse,  approximately 1 pack per day for  many years.  No history of seizures.  She does have a history of a spot on  her liver which she says is from drinking.  She has a history of 1 normal  vaginal delivery and a bilateral tubal ligation, no other surgeries, no  other hospitalizations.  Review of systems is remarkable for the patient  reporting blackouts, disrupted sleep with tremors, sweats, waking up with  heart pounding.  No history of chest pain or MI.  She has had some feelings  of burning in her stomach, no history of GI bleed, no changes in stool.  Bowels are regular.  No symptoms of dysuria, no fever or chills, no  abdominal pain.  The patient had 2 loose stools this morning and was too  nauseated to eat breakfast.   MEDICATIONS:  None.  The patient has been on Prozac in the past which she  believes helped her depression.   DRUG ALLERGIES:  None.   POSITIVE PHYSICAL FINDINGS:  Thin, emaciated female who appears about 20  years older than her stated age, 5 feet 7-1/2  inches tall, 114 pounds,  temperature 98.2, pulse 77, respirations 16, blood pressure 142/87.  She had  a full physical examination done in the emergency room which was generally  unremarkable.  She complains of quite a lot of anxiety and we do not that  she has multiple scattered scabbed lesions over her arms and the backs of  her hands which she attributes to anxious picking.  Physical examination is  noted in the record.   DIAGNOSTIC STUDIES:  CBC:  WBC 7.8, hemoglobin 15.3, hematocrit 45,  platelets 248,000.  Electrolytes:  Sodium 135, potassium 3.7, chloride 101,  CO2 27, BUN 7, creatinine 0.7, glucose 89.  Liver enzymes: SGOT 82, SGPT 61,  alkaline phosphatase 84 and total bilirubin 0.4.  The patient's calcium  level is normal at 9.4.  TSH is currently pending.  Alcohol level in the  emergency room was 46 and urine drug screen was positive for cocaine.  Routine urinalysis is currently pending.   MENTAL STATUS EXAM:  This is a fully alert female, anxious affect, readily  tearful, cries pretty much constantly through the interview.  Affect is  blunted.  Speech is decreased in amount but otherwise normal in pace and  tone.  She is able to articulate her problems adequately, expresses herself  well.  Mood is depressed, a lot of guilt about her drinking and functioning  as a parent, ashamed of herself, hopeless to stay sober from the alcohol and  stay away from it. Thought process is remarkable for some passive suicidal  ideation, no active plan, no homicidal thoughts, no evidence of psychosis.  Insight is adequate, impulse control and judgment within normal limits.  Cognition is well preserved.  .   ADMISSION DIAGNOSIS:  AXIS I:  Major depression, recurrent, severe, ethyl  alcohol dependence and polysubstance abuse.  AXIS II:  Deferred.  AXIS III:  Rule out hepatitis secondary to alcohol abuse.  AXIS IV:  Severe problems with social functioning. AXIS V:  Current 28, past year  50-64.   INITIAL PLAN OF CARE:  Plan is to voluntarily admit the patient with q.15  minute checks in place.  We have started  her on a Librium protocol which  she is tolerating well.  We estimate her CIWA today during interview at  approximately 7.  She wants to follow up and go back to [facility] which  we will agree with.  We are checking her CIWAs every 6 hours while she is  awake, at least for the first 48 hours.  She has had  some nausea and poor  appetite so we are going to provide her with Ensure shakes q.i.d. p.r.n.  until she is able to take full meals, and we will start her back on Prozac  20 mg daily and we will consider a treatment with Disulfram and review that  with her at some point after she makes some progress with detox.  The  patient is in agreement with the plan.   ESTIMATED LENGTH OF STAY:  5 days.     Margaret A. Lorin Picket, N.P.      Jasmine Pang, M.D.  Electronically Signed   MAS/MEDQ  D:  09/22/2005  T:  09/22/2005  Job:  [id number]

## 2010-09-10 NOTE — Discharge Summary (Signed)
NAMEJANETT, Debbie Grimes NO.:  [id number]   MEDICAL RECORD NO.:  [id number]          PATIENT TYPE:  IPS   LOCATION:  0306                          [facility]ACILITY[facility]  [facility]   PHYSICIAN:  Jasmine Pang, M.D. DATE OF BIRTH:  02-Apr-1966   DATE OF ADMISSION:  09/21/2005  DATE OF DISCHARGE:  09/25/2005                                 DISCHARGE SUMMARY   IDENTIFICATION:  This is a 45 year old single Caucasian female who was  admitted on a voluntary basis to my service on Sep 21, 2005.   HISTORY OF PRESENT ILLNESS:  The patient was admitted requesting detox from  alcohol.  She said she had been pressured by her mother to clean up in order  to care for her 45 year old.  The patient lost her job last week due to  alcohol abuse.  She has been a patient at the [facility] for 2 years.  She drinks significantly, heavily every day.  She has been having blackouts.  She has also been using cocaine but has no memory of the use.  The longest  period sober for her is 2 weeks.  The patient also uses cigarettes.  The  patient also has GERD but is on no medications.  She has been on Prozac in  the past.  She has no known drug allergies.  The patient states she has a  lot of guilt and shame about her drinking.  She has been drinking since the  age of 67.  As indicated above her longest period sober was 2 weeks a couple  of years ago.  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  She says that she drinks daily and  gets shaky and sweaty at night and wakes up needing to have a drink.  She  has been drinking approximately 9 to 12 40ounce beers every day.  She denies  any legal charges related to alcohol intake.  She admits to depression and  is quite tearful.  She admits to having a dysphoric mood, being depressed  with no active suicidal ideation.  At times she states she is so ashamed of  herself she wishes she were dead, but no intent.   There is no homicidal  thought or history of psychosis or suicide attempt.   PHYSICAL EXAMINATION:  The patient's physical examination was done by our  nurse practitioner, Kari Baars. She was a thin, emaciated female who  appears about 20 years older than her stated age.  She is 5 foot 7 and 1/2  inches tall, weighs 114 pounds.  Temperature is 98.2, pulse 77, respirations  16, blood pressure 142/87.  She had a full physical examination done in the  emergency room which was generally unremarkable.  She complains of a lot of  anxiety and it is noted she has multiple scattered scab lesions over her arm  and back of her hands which she attributes to anxious picking.  Physical  examination is noted in the record.  There were no other pertinent physical  findings.  ADMISSION LABORATORIES:  Routine chemistry panel was grossly within normal  limits except for an elevated AST of 82 (0-37) and elevated ALT of 61 (0-  40).  TSH was within normal limits.  Urinalysis revealed bacteria and  numerous WBCs.  She had no symptoms of dysuria, fever or chills or abdominal  pain.   HOSPITAL COURSE:  Upon admission the patient was placed on a low-dose  Librium protocol.  She was also placed on trazodone 25 mg p.o. q.h.s. p.r.n.  insomnia.  On Sep 21, 2005 the patient was placed on a nicotine patch  starting at 2 mg nicotine gum every 4 hours as needed.  She then could have  2 mg q.2h for cravings.  On Sep 22, 2005 the patient was started on Prozac  20 mg daily and Ensure 1 can q.i.d.  On Sep 22, 2005 the patient was started  on Zyprexa Zydis every 6 hours as needed  for agitation.  On September 24, 2005  her Prozac was increased to 30 mg p.o. daily.  On September 24, 2005 the nicotine  gum was discontinued and she was started on a nicotine patch protocol.  The  patient  tolerated her medications well with no significant side effects.  It was noted after discharge that she had bacteremia and the patient was  called  to inform her that she should be checked by her doctor for a urinary  tract infection.  Unfortunately this did not return until after discharge.   On Sep 22, 2005 I met with the patient initially.  She reported seeking  detox from alcohol.  She also admits to a history of cocaine dependence.  She was withdrawn but cooperative.  Mood was depressed, affect sad,  constricted.  On September 23, 2005 she told me I am here because my little girl  wanted me to.  She stated her daughter did not like to see her drunk.  She  was motivated to work on her alcohol dependence.  On September 24, 2005 the  patient was wanting discharge.  She was crying and emotional and labile.  She was saying my family is my life.  She appeared depressed, tearful and  withdrawn.  She had a mild tremor and was requiring Librium.  It was felt  she should not be discharged on that day and the patient agreed to stay.   On September 25, 2005 the patient was ready to go home.  She was motivated to stop  drinking.  She reported her boyfriend was going to leave her if she did not.  She has a long history of drinking.  She denies withdrawal symptoms.  She is  sleeping well.  She denies dangerous thoughts.  She was tolerating  antidepressant.  She still had mild hand tremors.  Her mood was less  depressed and anxious.  Affect consistent with mood.  No suicidal or  homicidal ideation.  No auditory or visual hallucinations. No paranoia or  delusions.  Her thought processes were logical and goal directed.  Thought  content: Excited to get home to her family and boyfriend.  The cognitive  exam was grossly within normal limits.  The patient was discharged via taxi.  She was going to return home to live with her mother and daughter.   DISCHARGE DIAGNOSES:  AXIS I:  Major depression, recurrent, severe, without  psychosis, polysubstance dependence (alcohol and cocaine).  AXIS II:  Deferred. AXIS III:  Possible urinary tract infection, possible hepatitis  secondary  to  alcohol abuse.  AXIS IV:  Severe (problems with social environment and support group,  occupational problem, housing problem, economic problem).  AXIS V.  Global assessment of functioning upon discharge was 40, global  assessment of functioning upon admission was 28, global assessment of  functioning highest past year 58-64.   DISCHARGE PLANS:  The patient had no specific dietary or activity level  restrictions.  The patient was told to follow up with her family physician  for her abnormal labs.   DISCHARGE MEDICATIONS:  1.  Prozac 30 mg daily.  2.  Librium 25 mg in the evening and then one the following morning then      discontinue. She was also told to drink fluids.   POST HOSPITAL CARE PLAN:  Were to be arranged by the case manager and she  will follow up with the patient.  She has been seen at the [facility] and  will most likely return there.      Jasmine Pang, M.D.  Electronically Signed     BHS/MEDQ  D:  10/14/2005  T:  10/14/2005  Job:  [id number]

## 2010-09-10 NOTE — Op Note (Signed)
[name], [name]                       ACCOUNT NO.:  [id number]   MEDICAL RECORD NO.:  [id number]                   PATIENT TYPE:  AMB   LOCATION:  DSC                                  FACILITY:  [facility]   PHYSICIAN:  Christopher E. Ezzard Standing, M.D.         DATE OF BIRTH:  [birth year]   DATE OF PROCEDURE:  06/24/2003  DATE OF DISCHARGE:                                 OPERATIVE REPORT   PREOPERATIVE DIAGNOSIS:  Nasal dorsum sebaceous cyst, 1.5 cm, left ear canal  wax impaction.   POSTOPERATIVE DIAGNOSIS:  Nasal dorsum sebaceous cyst, 1.5 cm, left ear  canal wax impaction.   OPERATION PERFORMED:  Excision of nasal sebaceous cyst with intermediate  closure.  Cleaning left ear canal.   SURGEON:  Kristine Garbe. Ezzard Standing, M.D.   ANESTHESIA:  Local 1% Xylocaine 1:100,000 epinephrine.   COMPLICATIONS:  None.   INDICATIONS FOR PROCEDURE:  Debbie Grimes is a 45 year old female who has  had a large cyst on her nasal dorsum now for over a year. She has also had a  large amount of wax built up in both ear canals.  I was unable to clean the  left ear canal in the office in initial visit.  She is taken to the  operating room at this time for removal of the nasal dorsum cyst and  cleaning of the left ear canal.   DESCRIPTION OF PROCEDURE:  Nasal dorsum was prepped with Betadine solution.  Nose was then injected with 3 mL Xylocaine with epinephrine.  The patient  had a large sebaceous cyst on the nasal dorsum approximately 1.5 cm size.  The opening of the cyst was elliptically excised along with the surrounding  skin and then the cyst was removed from the subcutaneous tissue.  The cyst  was excised and sent to pathology.  Defect was closed with a single 5-0  Vicryl suture subcutaneously and 6-0 nylon through the skin.  Bacitracin  ointment and dressing was applied.  Next the left ear was examined.  There  was a large ball of hard wax impacted in the inferior aspect of the ear  canal.  This  was removed after applying peroxide.  The TM itself was  unaffected.  The TM was clear.  She had just a small amount of residual  cerumen in the right ear canal which was likewise cleaned.  The TM was  otherwise normal.  The patient tolerated the procedure well.   DISPOSITION:  [name] is discharged home.  Will have her follow up in my  office in six days to have the sutures removed from the nasal dorsum.  She  is instructed to take Tylenol as needed for pain.  Kristine Garbe. Ezzard Standing, M.D.    CEN/MEDQ  D:  06/24/2003  T:  06/24/2003  Job:  [id number]

## 2010-09-10 NOTE — H&P (Signed)
Debbie Grimes, Debbie Grimes NO.:  [id number]   MEDICAL RECORD NO.:  [id number]          PATIENT TYPE:  IPS   LOCATION:  0300                          FACILITY:  [facility]   PHYSICIAN:  Geoffery Lyons, M.D.      DATE OF BIRTH:  [birth year]   DATE OF ADMISSION:  11/16/2005  DATE OF DISCHARGE:                         PSYCHIATRIC ADMISSION ASSESSMENT   This is a 45 year old single white female voluntarily admitted on November 17, 2005.   HISTORY OF PRESENT ILLNESS:  Patient presents with a history of alcohol  dependence.  Patient relapsed about 3 days after her last admission in June  of 2007 for alcohol abuse.  Patient has been drinking up to 7-8 40 ounce  beers daily, her last drink was on November 16, 2005.  Reported drinking in the  morning and has had a recent DUI.  Feels depressed but denies any suicidal  thoughts, lost about 15 pounds, denies any drug use.  States that she wants  some medication to help her out with her alcohol cravings.   PAST PSYCHIATRIC HISTORY:  The patient was originally discharged in June of  2007 for alcohol abuse.  She was to follow up at the [facility].  Patient  states that she did for some time but states that wanted medicine for her  alcohol cravings and they were reluctant to provide the prescription.   SOCIAL HISTORY:  She is a 45 year old single white female, has 2 children.  She lives with her boyfriend.  She is unemployed.  Has a DUI, which is her  second.   FAMILY HISTORY:  Mother and father, states history of alcoholism but not  drinking at this time.   ALCOHOL DRUG HISTORY:  Patient smokes, denies any drug use.   ALCOHOL HABITS:  As described above.   PRIMARY CARE Tahirih Lair:  None.   MEDICAL PROBLEMS:  None.   MEDICATION:  1.  Prozac 30 mg daily.  2.  Neurontin 300 mg t.i.d.  3.  Trazodone 50 mg at bedtime.  4.  Prilosec 20 mg daily.   DRUG ALLERGIES:  NO KNOWN ALLERGIES.   Patient was assessed at [facility].  This is a thin  female, she appears older than her calendar years.  She has poor skin  turgor.  Her temperature is 97.4, 87 heart rate, 16 respirations.  Blood  pressure is 139/80.  Her urine drug screen is negative.  Her CMET shows  elevated liver function.  AST is 102, ALT is 57, bilirubin is 1.3, alcohol  level is 123.  Pregnancy test was negative, H&H is 17.  Hemoglobin 17,  hematocrit is 50.   MENTAL STATUS EXAM:  Fully alert, cooperative female with good eye contact  and she appears older than her calendar years.  Has multiple tattoos noted.  Speech is clear, normal rate and tone.  Patient feels depressed, affect is  flat.  Thought processes are coherent.  No evidence of psychosis.  Cognitive  function intact.  Memory is good, judgment is poor, insight is poor, poor  impulse control.   AXIS I:  Alcohol  dependence, depressive disorder, not otherwise specified.   AXIS II:  Deferred.   AXIS III:  None.   AXIS IV:  Other substance problems related to chronic substance abuse,  possible problems with occupation.   AXIS V:  Current Global Assessment of Functioning is 40.   PLAN:  1.  Stabilize __________ detox patient.  2.  Will work on __________.  3.  Will reinforce medication compliance.  4.  Case manager is to assess any rehab programs available.   INTENDED LENGTH OF STAY:  Five to six days.      Landry Corporal, N.P.      Geoffery Lyons, M.D.  Electronically Signed    JO/MEDQ  D:  11/17/2005  T:  11/17/2005  Job:  [id number]

## 2011-01-20 LAB — COMPREHENSIVE METABOLIC PANEL
ALT: 88 — ABNORMAL HIGH
Alkaline Phosphatase: 73
CO2: 25
Calcium: 8.8
GFR calc non Af Amer: >60
Glucose, Bld: 91
Sodium: 140
Total Bilirubin: 0.4

## 2011-01-20 LAB — LIPASE, BLOOD: Lipase: 69 — ABNORMAL HIGH

## 2011-01-20 LAB — CBC
Hemoglobin: 14.9
MCHC: 36
RBC: 4.11

## 2011-01-20 LAB — DIFFERENTIAL
Basophils Absolute: 0.1
Basophils Relative: 1
Eosinophils Absolute: 0.4
Lymphs Abs: 3.9
Neutrophils Relative %: 51

## 2011-01-24 HISTORY — PX: OTHER SURGICAL HISTORY: SHX169

## 2011-01-24 LAB — RAPID URINE DRUG SCREEN, HOSP PERFORMED
Barbiturates: NOT DETECTED
Opiates: NOT DETECTED
Tetrahydrocannabinol: NOT DETECTED

## 2011-01-24 LAB — COMPREHENSIVE METABOLIC PANEL
Albumin: 3.8
Alkaline Phosphatase: 88
BUN: 3 — ABNORMAL LOW
Calcium: 8.7
Creatinine, Ser: 0.63
Glucose, Bld: 86
Potassium: 3.6
Total Protein: 7.3

## 2011-01-24 LAB — CBC
HCT: 40
Hemoglobin: 13.7
MCHC: 34.1
Platelets: 227
RDW: 12.8

## 2011-01-24 LAB — DIFFERENTIAL
Lymphocytes Relative: 14
Lymphs Abs: 1.6
Monocytes Absolute: 1.2 — ABNORMAL HIGH
Monocytes Relative: 11
Neutro Abs: 7.8 — ABNORMAL HIGH
Neutrophils Relative %: 73

## 2011-01-24 LAB — URINALYSIS, ROUTINE W REFLEX MICROSCOPIC
Bilirubin Urine: NEGATIVE
Bilirubin Urine: NEGATIVE
Hgb urine dipstick: NEGATIVE
Ketones, ur: NEGATIVE
Nitrite: NEGATIVE
Protein, ur: NEGATIVE
Specific Gravity, Urine: 1.004 — ABNORMAL LOW
Urobilinogen, UA: 0.2
pH: 7

## 2011-01-24 LAB — GC/CHLAMYDIA PROBE AMP, URINE
Chlamydia, Swab/Urine, PCR: NEGATIVE
GC Probe Amp, Urine: NEGATIVE

## 2011-01-24 LAB — URINE MICROSCOPIC-ADD ON

## 2011-01-24 LAB — ETHANOL: Alcohol, Ethyl (B): 40 — ABNORMAL HIGH

## 2011-01-24 LAB — RPR: RPR Ser Ql: NONREACTIVE

## 2011-01-26 ENCOUNTER — Emergency Department (HOSPITAL_COMMUNITY)
Admission: EM | Admit: 2011-01-26 | Discharge: 2011-01-26 | Discharge status: Against Medical Advice | Payer: Self-pay | Attending: Emergency Medicine | Admitting: Emergency Medicine

## 2011-01-26 DIAGNOSIS — W28XXXA Contact with powered lawn mower, initial encounter: Secondary | ICD-10-CM | POA: Insufficient documentation

## 2011-01-26 DIAGNOSIS — S91109A Unspecified open wound of unspecified toe(s) without damage to nail, initial encounter: Secondary | ICD-10-CM | POA: Insufficient documentation

## 2011-01-27 ENCOUNTER — Emergency Department (HOSPITAL_COMMUNITY)
Admission: EM | Admit: 2011-01-27 | Discharge: 2011-01-27 | Disposition: A | Discharge status: Home / Self Care | Payer: Self-pay | Attending: Emergency Medicine | Admitting: Emergency Medicine

## 2011-01-27 ENCOUNTER — Emergency Department (HOSPITAL_COMMUNITY): Payer: Self-pay

## 2011-01-27 DIAGNOSIS — M79609 Pain in unspecified limb: Secondary | ICD-10-CM | POA: Insufficient documentation

## 2011-01-27 DIAGNOSIS — W268XXA Contact with other sharp object(s), not elsewhere classified, initial encounter: Secondary | ICD-10-CM | POA: Insufficient documentation

## 2011-01-27 DIAGNOSIS — S91309A Unspecified open wound, unspecified foot, initial encounter: Secondary | ICD-10-CM | POA: Insufficient documentation

## 2011-01-27 DIAGNOSIS — M7989 Other specified soft tissue disorders: Secondary | ICD-10-CM | POA: Insufficient documentation

## 2011-01-28 LAB — POCT URINALYSIS DIP (DEVICE)
Protein, ur: 30 mg/dL — AB
Specific Gravity, Urine: 1.015 (ref 1.005–1.030)
pH: 7 (ref 5.0–8.0)

## 2011-01-28 LAB — GC/CHLAMYDIA PROBE AMP, GENITAL: GC Probe Amp, Genital: NEGATIVE

## 2011-01-28 LAB — WET PREP, GENITAL

## 2011-02-02 NOTE — Consult Note (Signed)
  NAMEALANEY, Debbie Grimes NO.:  [id number]  MEDICAL RECORD NO.:  [id number]  LOCATION:  WLED                         FACILITY:  [facility]  PHYSICIAN:  Myrtie Neither, MD      DATE OF BIRTH:  July 12, 1965  DATE OF CONSULTATION:  01/27/2011 DATE OF DISCHARGE:  01/27/2011                                CONSULTATION   REFERRING PHYSICIAN:  Doug Sou, M.D.  REASON FOR CONSULTATION:  Laceration of left great toe with fractured distal phalanx.  HISTORY:  This is a 45 year old female who was walking close to a lawn mower driven by someone else and got her left great toe cut by the lawn mower.  This happened yesterday.  The patient had come to the ER, but left and came back again today because of increased swelling and pain.  ALLERGIES:  None known.  SOCIAL HISTORY:  Alcohol as well as tobacco.  REVIEW OF SYSTEMS:  No cardiac or respiratory, no urinary or bowel symptoms.  FAMILY HISTORY:  Noncontributory.  PHYSICAL EXAMINATION:  Limited to that of the left foot, a 1-inch laceration with subcutaneous tissue exposed over the medial aspect of the great toe, tenderness, swollen, mildly increased warmth about the area.  X-ray revealed fractured distal phalanx, nondisplaced.  IMPRESSION: 1. Laceration, left great toe. 2. Fractured distal phalanx, left great toe.  RECOMMENDATIONS:  Irrigation and debridement of the area.  We will allow wound to heal secondary.  Apply dressing with Neosporin, Augmentin 500 mg b.i.d., fracture shoe, ice packs, elevation, Percocet one to two q.6 p.r.n. for pain and return to the office on this coming Monday, which is 5 days.  The patient is being discharged in stable and satisfactory condition.     Myrtie Neither, MD    AC/MEDQ  D:  01/27/2011  T:  01/28/2011  Job:  [id number]  Electronically Signed by Myrtie Neither MD on 02/02/2011 12:35:30 PM

## 2011-07-13 ENCOUNTER — Emergency Department (HOSPITAL_COMMUNITY): Payer: Self-pay

## 2011-07-13 ENCOUNTER — Emergency Department (HOSPITAL_COMMUNITY)
Admission: EM | Admit: 2011-07-13 | Discharge: 2011-07-13 | Disposition: A | Discharge status: Home / Self Care | Payer: Self-pay | Attending: Emergency Medicine | Admitting: Emergency Medicine

## 2011-07-13 ENCOUNTER — Encounter (HOSPITAL_COMMUNITY): Payer: Self-pay | Admitting: *Deleted

## 2011-07-13 DIAGNOSIS — M25439 Effusion, unspecified wrist: Secondary | ICD-10-CM | POA: Insufficient documentation

## 2011-07-13 DIAGNOSIS — M67439 Ganglion, unspecified wrist: Secondary | ICD-10-CM

## 2011-07-13 DIAGNOSIS — F17200 Nicotine dependence, unspecified, uncomplicated: Secondary | ICD-10-CM | POA: Insufficient documentation

## 2011-07-13 DIAGNOSIS — M79609 Pain in unspecified limb: Secondary | ICD-10-CM | POA: Insufficient documentation

## 2011-07-13 DIAGNOSIS — M25539 Pain in unspecified wrist: Secondary | ICD-10-CM | POA: Insufficient documentation

## 2011-07-13 DIAGNOSIS — M6740 Ganglion, unspecified site: Secondary | ICD-10-CM | POA: Insufficient documentation

## 2011-07-13 MED ORDER — NAPROXEN 500 MG PO TABS: 500.0000 mg | ORAL_TABLET | Freq: Two times a day (BID) | ORAL | Status: DC

## 2011-07-13 MED ORDER — IBUPROFEN 800 MG PO TABS
800.0000 mg | ORAL_TABLET | Freq: Once | ORAL | Status: AC
  Administered 2011-07-13: 800 mg via ORAL

## 2011-07-13 MED ORDER — IBUPROFEN 800 MG PO TABS
ORAL_TABLET | ORAL | Status: AC
  Filled 2011-07-13: qty 1

## 2011-07-13 MED ORDER — HYDROCODONE-ACETAMINOPHEN 5-500 MG PO TABS
1.0000 | ORAL_TABLET | Freq: Four times a day (QID) | ORAL | Status: AC | PRN
Start: 2011-07-13 — End: 2011-07-23

## 2011-07-13 NOTE — ED Notes (Signed)
Pt reports progressively worsening right wrist pain, states pain has been there for approx 2 years. Pt is able to wiggle fingers and make a fist. Denies any recent injury. Pulse present.

## 2011-07-13 NOTE — ED Notes (Signed)
Patient states unknown onset right wrist pain denies trauma with swelling full range motion with pain 10/10 sharp radial pulses +2 bilateral states sensation decreased intermittent numbness tingling able to move all fingers.

## 2011-07-13 NOTE — Progress Notes (Signed)
Orthopedic Tech Progress Note Patient Details:  Debbie Grimes [birth year] [id number]  Type of Splint: Thumb spica Splint Location: velcro to right wrist Splint Interventions: Application    Dailan Pfalzgraf T 07/13/2011, 12:13 PM

## 2011-07-13 NOTE — Discharge Instructions (Signed)
Your x-ray is normal today. i think your pain in your wrist is related to a ganglion cyst that is on it. Keep wrist splint on at all times. Take naprosyn and vicodin for pain as prescribed. Follow up with a specialist for further evaluation and treatment.   Ganglion A ganglion is a swelling under the skin that is filled with a thick, jelly-like substance. It is a synovial cyst. This is caused by a break (rupture) of the joint lining from the joint space. A ganglion often occurs near an area of repeated minor trauma (damage caused by an accident). Trauma may also be a repetitive movement at work or in a sport. TREATMENT  It often goes away without treatment. It may reappear later. Sometimes a ganglion may need to be surgically removed. Often they are drained and injected with a steroid. Sometimes they respond to:  Rest.   Splinting.  HOME CARE INSTRUCTIONS   Your caregiver will decide the best way of treating your ganglion. Do not try to break the ganglion yourself by pressing on it, poking it with a needle, or hitting it with a heavy object.   Use medications as directed.  SEEK MEDICAL CARE IF:   The ganglion becomes larger or more painful.   You have increased redness or swelling.   You have weakness or numbness in your hand or wrist.  MAKE SURE YOU:   Understand these instructions.   Will watch your condition.   Will get help right away if you are not doing well or get worse.  Document Released: 04/08/2000 Document Revised: 03/31/2011 Document Reviewed: 06/05/2007 [facility] Patient Information 2012 [place], [place].

## 2011-07-13 NOTE — ED Notes (Signed)
Paged Ortho  

## 2011-07-13 NOTE — ED Provider Notes (Signed)
History     CSN: [id number]  Arrival date & time 07/13/11  0917   First MD Initiated Contact with Patient 07/13/11 1004      Chief Complaint  Patient presents with  . Hand Pain    (Consider location/radiation/quality/duration/timing/severity/associated sxs/prior treatment) Patient is a 46 y.o. female presenting with wrist pain.  Wrist Pain This is a chronic problem. The current episode started more than 1 month ago. The problem occurs constantly. The problem has been gradually worsening. Associated symptoms include arthralgias and joint swelling. Pertinent negatives include no chills, fever or rash.  Pt states progressive right wrist pain for almost a year but worsened in last 2 wks. Denies injury. States was seen for this once in ED, given a splint which she is not wearing because "it is not helping." States wrist is swollen. Tender. Denies fever, chills, iv drug use. No medications taken prior to arrival. Normal hand.   History reviewed. No pertinent past medical history.  History reviewed. No pertinent past surgical history.  History reviewed. No pertinent family history.  History  Substance Use Topics  . Smoking status: Current Everyday Smoker  . Smokeless tobacco: Not on file  . Alcohol Use: Yes    OB History    Grav Para Term Preterm Abortions TAB SAB Ect Mult Living                  Review of Systems  Constitutional: Negative for fever and chills.  HENT: Negative.   Eyes: Negative.   Respiratory: Negative.   Cardiovascular: Negative.   Gastrointestinal: Negative.   Genitourinary: Negative.   Musculoskeletal: Positive for joint swelling and arthralgias.  Skin: Negative for rash.  Neurological: Negative.   Psychiatric/Behavioral: Negative.     Allergies  Review of patient's allergies indicates no known allergies.  Home Medications  No current outpatient prescriptions on file.  BP 128/78  Pulse 89  Temp(Src) 98.1 F (36.7 C) (Oral)  Resp 16  SpO2  99%  Physical Exam  Nursing note and vitals reviewed. Constitutional: She is oriented to person, place, and time. She appears well-developed and well-nourished. No distress.  HENT:  Head: Normocephalic.  Eyes: Conjunctivae are normal.  Neck: Neck supple.  Cardiovascular: Normal rate, regular rhythm and normal heart sounds.   Pulmonary/Chest: Effort normal and breath sounds normal. No respiratory distress.  Musculoskeletal:       Right wrist tender to plaption especially over radial dorsal aspect. There is a swelling over that area, that is soft, most likely ganglion cyst. Pain with wrist flexion and extension. Pain with right thumb movement. Normal rest of the finger exam. No erythema or warmth with palpation  Neurological: She is alert and oriented to person, place, and time.  Skin: Skin is warm and dry.  Psychiatric: She has a normal mood and affect.    ED Course  Procedures (including critical care time)  Negative wrist x-ray, no signs of infection. Suspect ganglion cyst vs tendonitis vs carpal tunnel. Splint provided. Will d/c home with ortho follow up.    No diagnosis found.    MDM          Lottie Mussel, PA 07/13/11 1541

## 2011-07-14 NOTE — ED Provider Notes (Signed)
Medical screening examination/treatment/procedure(s) were performed by non-physician practitioner and as supervising physician I was immediately available for consultation/collaboration.   Ronette Hank A. Alexx Giambra, MD 07/14/11 1107 

## 2011-10-02 ENCOUNTER — Emergency Department (HOSPITAL_COMMUNITY)
Admission: EM | Admit: 2011-10-02 | Discharge: 2011-10-02 | Disposition: A | Discharge status: Home / Self Care | Payer: Self-pay | Attending: Emergency Medicine | Admitting: Emergency Medicine

## 2011-10-02 ENCOUNTER — Encounter (HOSPITAL_COMMUNITY): Payer: Self-pay | Admitting: Emergency Medicine

## 2011-10-02 DIAGNOSIS — F319 Bipolar disorder, unspecified: Secondary | ICD-10-CM | POA: Insufficient documentation

## 2011-10-02 DIAGNOSIS — M6740 Ganglion, unspecified site: Secondary | ICD-10-CM | POA: Insufficient documentation

## 2011-10-02 DIAGNOSIS — M67439 Ganglion, unspecified wrist: Secondary | ICD-10-CM

## 2011-10-02 DIAGNOSIS — F17200 Nicotine dependence, unspecified, uncomplicated: Secondary | ICD-10-CM | POA: Insufficient documentation

## 2011-10-02 HISTORY — DX: Bipolar disorder, unspecified: F31.9

## 2011-10-02 MED ORDER — OXYCODONE-ACETAMINOPHEN 5-325 MG PO TABS: 1.0000 | ORAL_TABLET | ORAL | Status: AC | PRN

## 2011-10-02 NOTE — ED Notes (Signed)
Pt states MD on [employer] (unsure of name) drained fluid off of her right wrist.  Pt states that MD told her she had a cyst on her wrist.  Pt continues to c/o pain to the right wrist and going down to thumb.

## 2011-10-02 NOTE — ED Notes (Signed)
Pt c/o right hand pain x 1 year. Pt seen here for same and sent to another Dr. Rock Nephew reports other Dr drained an area on her right wrist but has continued to have sharp pains.

## 2011-10-02 NOTE — ED Provider Notes (Signed)
History   This chart was scribed for Flint Melter, MD scribed by Magnus Sinning. The patient was seen in room STRE5/STRE5 seen at 11:    CSN: [id number]  Arrival date & time 10/02/11  1049   First MD Initiated Contact with Patient 10/02/11 1112      Chief Complaint  Patient presents with  . Hand Pain    right hand    (Consider location/radiation/quality/duration/timing/severity/associated sxs/prior treatment) HPI Debbie Grimes is a 46 y.o. female who presents to the Emergency Department complaining of chronic constant moderate right hand pain onset 1 year. Pt says pain has been ongoing for a year after she had cyst drainage. Patient explains she was seen approximately 1 year ago by Dr. Orlan Leavens, who performed drainage procedure for treatment of ganglion cyst. Pt says the pain has been persistently swollen since and that she has been using wrist splint without pain relief.  Past Medical History  Diagnosis Date  . Bipolar 1 disorder   . Depression     History reviewed. No pertinent past surgical history.  History reviewed. No pertinent family history.  History  Substance Use Topics  . Smoking status: Current Everyday Smoker  . Smokeless tobacco: Not on file  . Alcohol Use: Yes     Review of Systems 10 Systems reviewed and are negative for acute change except as noted in the HPI. Allergies  Review of patient's allergies indicates no known allergies.  Home Medications   Current Outpatient Rx  Name Route Sig Dispense Refill  . OXYCODONE-ACETAMINOPHEN 5-325 MG PO TABS Oral Take 1 tablet by mouth every 4 (four) hours as needed for pain. 15 tablet 0    BP 134/81  Pulse 84  Temp 97.8 F (36.6 C)  Resp 18  SpO2 98%  Physical Exam  Nursing note and vitals reviewed. Constitutional: She is oriented to person, place, and time. She appears well-developed and well-nourished. No distress.  HENT:  Head: Normocephalic and atraumatic.  Eyes: Conjunctivae and EOM are  normal.  Neck: Neck supple. No tracheal deviation present.  Cardiovascular: Normal rate.   Pulmonary/Chest: Effort normal. No respiratory distress.  Musculoskeletal: Normal range of motion.       Dorsal radial right wrist has a tender swollen mass consistent with ganglion cyst. Intact motion in the right hand and normal distal sensation and circulation in the right fingers.    Neurological: She is alert and oriented to person, place, and time.  Skin: Skin is warm and dry.  Psychiatric: She has a normal mood and affect. Her behavior is normal.    ED Course  Procedures (including critical care time) DIAGNOSTIC STUDIES: Oxygen Saturation is 98% on room air, normal by my interpretation.    COORDINATION OF CARE:  12:04: Patient discharged with plans to follow-up with pcp, and provided rx for Norco. Patient advised by EDMD to continue with wrist splint wear. Patient agrees to the plan of action set at this time.  Labs Reviewed - No data to display No results found.   1. Ganglion cyst of wrist       MDM  Persistent pain, secondary to ganglion cyst. Doubt joint infection, fracture, or tendinitis.   I personally performed the services described in this documentation, which was scribed in my presence. The recorded information has been reviewed and considered.   Plan: Home Medications- Percocet; Home Treatments- Splint for pain; Recommended follow up- Hand Surgeon for definitive treatment        Flint Melter, MD  10/02/11 1909 

## 2011-10-02 NOTE — Discharge Instructions (Signed)
Ganglion A ganglion is a swelling under the skin that is filled with a thick, jelly-like substance. It is a synovial cyst. This is caused by a break (rupture) of the joint lining from the joint space. A ganglion often occurs near an area of repeated minor trauma (damage caused by an accident). Trauma may also be a repetitive movement at work or in a sport. TREATMENT   It often goes away without treatment. It may reappear later. Sometimes a ganglion may need to be surgically removed. Often they are drained and injected with a steroid. Sometimes they respond to:  Rest.   Splinting.  HOME CARE INSTRUCTIONS    Your caregiver will decide the best way of treating your ganglion. Do not try to break the ganglion yourself by pressing on it, poking it with a needle, or hitting it with a heavy object.   Use medications as directed.  SEEK MEDICAL CARE IF:    The ganglion becomes larger or more painful.   You have increased redness or swelling.   You have weakness or numbness in your hand or wrist.  MAKE SURE YOU:    Understand these instructions.   Will watch your condition.   Will get help right away if you are not doing well or get worse.  Document Released: 04/08/2000 Document Revised: 03/31/2011 Document Reviewed: 06/05/2007 [employer] Patient Information 2012 [employer]. 

## 2011-12-25 ENCOUNTER — Emergency Department (HOSPITAL_COMMUNITY)
Admission: EM | Admit: 2011-12-25 | Discharge: 2011-12-26 | Disposition: A | Discharge status: Home / Self Care | Payer: Self-pay | Attending: Emergency Medicine | Admitting: Emergency Medicine

## 2011-12-25 ENCOUNTER — Encounter (HOSPITAL_COMMUNITY): Payer: Self-pay | Admitting: *Deleted

## 2011-12-25 ENCOUNTER — Emergency Department (HOSPITAL_COMMUNITY): Payer: Self-pay

## 2011-12-25 DIAGNOSIS — F319 Bipolar disorder, unspecified: Secondary | ICD-10-CM | POA: Insufficient documentation

## 2011-12-25 DIAGNOSIS — W010XXA Fall on same level from slipping, tripping and stumbling without subsequent striking against object, initial encounter: Secondary | ICD-10-CM | POA: Insufficient documentation

## 2011-12-25 DIAGNOSIS — S81019A Laceration without foreign body, unspecified knee, initial encounter: Secondary | ICD-10-CM

## 2011-12-25 DIAGNOSIS — F17200 Nicotine dependence, unspecified, uncomplicated: Secondary | ICD-10-CM | POA: Insufficient documentation

## 2011-12-25 DIAGNOSIS — Z23 Encounter for immunization: Secondary | ICD-10-CM | POA: Insufficient documentation

## 2011-12-25 DIAGNOSIS — S81009A Unspecified open wound, unspecified knee, initial encounter: Secondary | ICD-10-CM | POA: Insufficient documentation

## 2011-12-25 MED ORDER — TETANUS-DIPHTHERIA TOXOIDS TD 5-2 LFU IM INJ
0.5000 mL | INJECTION | Freq: Once | INTRAMUSCULAR | Status: AC
  Administered 2011-12-25: 0.5 mL via INTRAMUSCULAR
  Filled 2011-12-25: qty 0.5
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
## 2011-12-25 NOTE — ED Provider Notes (Signed)
History     CSN: [id number]  Arrival date & time 12/25/11  2108   First MD Initiated Contact with Patient 12/25/11 2211      Chief Complaint  Patient presents with  . Alcohol Problem  . Fall  . Extremity Laceration    left knee    (Consider location/radiation/quality/duration/timing/severity/associated sxs/prior treatment) Patient is a 46 y.o. female presenting with alcohol problem and fall. The history is provided by the patient.  Alcohol Problem  Fall   patient here after a fall onto her left knee that occurred yesterday at 2 PM. She admits to alcohol intoxication. Denies a loss of consciousness. Patient presents today do to increased pain. Denies any other complaints of at this time. Patient self medicated with alcohol again today but the pain continues  Past Medical History  Diagnosis Date  . Bipolar 1 disorder   . Depression     History reviewed. No pertinent past surgical history.  History reviewed. No pertinent family history.  History  Substance Use Topics  . Smoking status: Current Everyday Smoker  . Smokeless tobacco: Not on file  . Alcohol Use: Yes    OB History    Grav Para Term Preterm Abortions TAB SAB Ect Mult Living                  Review of Systems  All other systems reviewed and are negative.    Allergies  Review of patient's allergies indicates no known allergies.  Home Medications  No current outpatient prescriptions on file.  BP 113/77  Pulse 82  Temp 98.2 F (36.8 C) (Oral)  Resp 18  SpO2 95%  Physical Exam  Nursing note and vitals reviewed. Constitutional: She is oriented to person, place, and time. She appears well-developed and well-nourished.  Non-toxic appearance. No distress.  HENT:  Head: Normocephalic and atraumatic.  Eyes: Conjunctivae, EOM and lids are normal. Pupils are equal, round, and reactive to light.  Neck: Normal range of motion. Neck supple. No tracheal deviation present. No mass present.    Cardiovascular: Normal rate, regular rhythm and normal heart sounds.  Exam reveals no gallop.   No murmur heard. Pulmonary/Chest: Effort normal and breath sounds normal. No stridor. No respiratory distress. She has no decreased breath sounds. She has no wheezes. She has no rhonchi. She has no rales.  Abdominal: Soft. Normal appearance and bowel sounds are normal. She exhibits no distension. There is no tenderness. There is no rebound and no CVA tenderness.  Musculoskeletal: Normal range of motion. She exhibits no edema and no tenderness.       Left knee with 3cm lac without bleeding--no surrounding erythema  Neurological: She is alert and oriented to person, place, and time. She has normal strength. No cranial nerve deficit or sensory deficit. GCS eye subscore is 4. GCS verbal subscore is 5. GCS motor subscore is 6.  Skin: Skin is warm and dry. No abrasion and no rash noted.  Psychiatric: Her speech is normal. Her affect is blunt. She is slowed.    ED Course  Procedures (including critical care time)  Labs Reviewed - No data to display No results found.   No diagnosis found.    MDM  pts tetanus status updated, wound is 30 hours old--non-suturable wound  xrays neg      Toy Baker, MD 12/25/11 2311

## 2011-12-26 NOTE — ED Notes (Signed)
Patient is alert and oriented x3.  She was given DC instructions and follow up visit instructions.  Patient gave verbal understanding. She was DC ambulatory under his own power to home.  V/S stable.  He was not showing any signs of distress on DC 

## 2012-04-10 ENCOUNTER — Emergency Department (HOSPITAL_COMMUNITY)
Admission: EM | Admit: 2012-04-10 | Discharge: 2012-04-11 | Disposition: A | Discharge status: Home / Self Care | Payer: Self-pay | Attending: Emergency Medicine | Admitting: Emergency Medicine

## 2012-04-10 ENCOUNTER — Emergency Department (HOSPITAL_COMMUNITY): Payer: Self-pay

## 2012-04-10 DIAGNOSIS — R451 Restlessness and agitation: Secondary | ICD-10-CM

## 2012-04-10 DIAGNOSIS — Y939 Activity, unspecified: Secondary | ICD-10-CM | POA: Insufficient documentation

## 2012-04-10 DIAGNOSIS — F1010 Alcohol abuse, uncomplicated: Secondary | ICD-10-CM | POA: Insufficient documentation

## 2012-04-10 DIAGNOSIS — Z23 Encounter for immunization: Secondary | ICD-10-CM | POA: Insufficient documentation

## 2012-04-10 DIAGNOSIS — F329 Major depressive disorder, single episode, unspecified: Secondary | ICD-10-CM | POA: Insufficient documentation

## 2012-04-10 DIAGNOSIS — F3289 Other specified depressive episodes: Secondary | ICD-10-CM | POA: Insufficient documentation

## 2012-04-10 DIAGNOSIS — Y92009 Unspecified place in unspecified non-institutional (private) residence as the place of occurrence of the external cause: Secondary | ICD-10-CM | POA: Insufficient documentation

## 2012-04-10 DIAGNOSIS — F54 Psychological and behavioral factors associated with disorders or diseases classified elsewhere: Secondary | ICD-10-CM | POA: Insufficient documentation

## 2012-04-10 DIAGNOSIS — F319 Bipolar disorder, unspecified: Secondary | ICD-10-CM | POA: Insufficient documentation

## 2012-04-10 DIAGNOSIS — IMO0002 Reserved for concepts with insufficient information to code with codable children: Secondary | ICD-10-CM | POA: Insufficient documentation

## 2012-04-10 DIAGNOSIS — F10929 Alcohol use, unspecified with intoxication, unspecified: Secondary | ICD-10-CM

## 2012-04-10 DIAGNOSIS — F17200 Nicotine dependence, unspecified, uncomplicated: Secondary | ICD-10-CM | POA: Insufficient documentation

## 2012-04-10 DIAGNOSIS — S0993XA Unspecified injury of face, initial encounter: Secondary | ICD-10-CM | POA: Insufficient documentation

## 2012-04-10 DIAGNOSIS — R296 Repeated falls: Secondary | ICD-10-CM | POA: Insufficient documentation

## 2012-04-10 LAB — CBC WITH DIFFERENTIAL/PLATELET
Eosinophils Absolute: 0.3 10*3/uL (ref 0.0–0.7)
Eosinophils Relative: 4 % (ref 0–5)
HCT: 39.2 % (ref 36.0–46.0)
Hemoglobin: 13.6 g/dL (ref 12.0–15.0)
Lymphocytes Relative: 41 % (ref 12–46)
Lymphs Abs: 3.3 10*3/uL (ref 0.7–4.0)
MCH: 34.2 pg — ABNORMAL HIGH (ref 26.0–34.0)
MCV: 98.5 fL (ref 78.0–100.0)
Monocytes Absolute: 0.8 10*3/uL (ref 0.1–1.0)
Monocytes Relative: 10 % (ref 3–12)
RBC: 3.98 MIL/uL (ref 3.87–5.11)
WBC: 8.1 10*3/uL (ref 4.0–10.5)

## 2012-04-10 LAB — URINALYSIS, ROUTINE W REFLEX MICROSCOPIC
Bilirubin Urine: NEGATIVE
Glucose, UA: NEGATIVE mg/dL
Hgb urine dipstick: NEGATIVE
Ketones, ur: NEGATIVE mg/dL
Leukocytes, UA: NEGATIVE
pH: 6.5 (ref 5.0–8.0)

## 2012-04-10 LAB — COMPREHENSIVE METABOLIC PANEL
ALT: 45 U/L — ABNORMAL HIGH (ref 0–35)
AST: 60 U/L — ABNORMAL HIGH (ref 0–37)
Albumin: 3.4 g/dL — ABNORMAL LOW (ref 3.5–5.2)
CO2: 22 mEq/L (ref 19–32)
Calcium: 9.1 mg/dL (ref 8.4–10.5)
Creatinine, Ser: 0.65 mg/dL (ref 0.50–1.10)
Sodium: 138 mEq/L (ref 135–145)

## 2012-04-10 MED ORDER — HALOPERIDOL LACTATE 5 MG/ML IJ SOLN
5.0000 mg | Freq: Once | INTRAMUSCULAR | Status: AC
Start: 2012-04-10 — End: 2012-04-10
  Administered 2012-04-10: 5 mg via INTRAMUSCULAR
  Filled 2012-04-10: qty 1

## 2012-04-10 MED ORDER — LIDOCAINE-EPINEPHRINE-TETRACAINE (LET) SOLUTION
3.0000 mL | Freq: Once | NASAL | Status: AC
  Administered 2012-04-10: 3 mL via TOPICAL
  Filled 2012-04-10: qty 3

## 2012-04-10 MED ORDER — HALOPERIDOL LACTATE 5 MG/ML IJ SOLN
5.0000 mg | Freq: Once | INTRAMUSCULAR | Status: AC
  Administered 2012-04-10: 5 mg via INTRAMUSCULAR
  Filled 2012-04-10: qty 1

## 2012-04-10 MED ORDER — TETANUS-DIPHTH-ACELL PERTUSSIS 5-2.5-18.5 LF-MCG/0.5 IM SUSP
0.5000 mL | Freq: Once | INTRAMUSCULAR | Status: AC
  Administered 2012-04-10: 0.5 mL via INTRAMUSCULAR
  Filled 2012-04-10: qty 0.5

## 2012-04-10 MED ORDER — SODIUM CHLORIDE 0.9 % IV BOLUS (SEPSIS)
1000.0000 mL | Freq: Once | INTRAVENOUS | Status: AC
  Administered 2012-04-11: 1000 mL via INTRAVENOUS

## 2012-04-10 MED ORDER — ZIPRASIDONE MESYLATE 20 MG IM SOLR
20.0000 mg | Freq: Once | INTRAMUSCULAR | Status: AC
  Administered 2012-04-10: 20 mg via INTRAMUSCULAR

## 2012-04-10 NOTE — ED Notes (Signed)
Pt lying on right side sleeping,  Pulse ox 95%  rr 20

## 2012-04-10 NOTE — ED Notes (Signed)
Patient transported to CT 

## 2012-04-10 NOTE — ED Notes (Signed)
Pt sleeping. VS WNL.

## 2012-04-10 NOTE — ED Notes (Signed)
Pt taken out of restraints. Resting at this time. Pt is becoming more coherent.

## 2012-04-10 NOTE — ED Notes (Signed)
BJY:[id number]<FA> Expected date:04/10/12<BR> Expected time:<BR> Means of arrival:<BR> Comments:<BR> Per charge nurse
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
## 2012-04-11 NOTE — ED Provider Notes (Signed)
  Physical Exam  BP 114/58  Pulse 84  Resp 18  SpO2 94%  Physical Exam  ED Course  Procedures  MDM I have reevaluated the patient this morning, she has a normal mental status, she is willing to get up and walk, has a stable gait and states that she wants to go home. She is not have a ride but is able to walk. Patient stable at this time.      Vida Roller, MD 04/11/12 [phone]

## 2012-04-11 NOTE — ED Notes (Signed)
Patient ambulated around hallway with stand-by assistance. Gait was overall steady.

## 2012-04-17 ENCOUNTER — Emergency Department (HOSPITAL_COMMUNITY)
Admission: EM | Admit: 2012-04-17 | Discharge: 2012-04-17 | Disposition: A | Discharge status: Home / Self Care | Payer: Self-pay | Attending: Emergency Medicine | Admitting: Emergency Medicine

## 2012-04-17 ENCOUNTER — Encounter (HOSPITAL_COMMUNITY): Payer: Self-pay | Admitting: *Deleted

## 2012-04-17 DIAGNOSIS — F17200 Nicotine dependence, unspecified, uncomplicated: Secondary | ICD-10-CM | POA: Insufficient documentation

## 2012-04-17 DIAGNOSIS — F319 Bipolar disorder, unspecified: Secondary | ICD-10-CM | POA: Insufficient documentation

## 2012-04-17 DIAGNOSIS — F3289 Other specified depressive episodes: Secondary | ICD-10-CM | POA: Insufficient documentation

## 2012-04-17 DIAGNOSIS — Z4802 Encounter for removal of sutures: Secondary | ICD-10-CM

## 2012-04-17 DIAGNOSIS — F329 Major depressive disorder, single episode, unspecified: Secondary | ICD-10-CM | POA: Insufficient documentation

## 2012-04-17 NOTE — ED Notes (Signed)
Pt here for suture removal from the bridge of her nose

## 2012-04-17 NOTE — ED Notes (Signed)
PA HAS REMOVED PT SUTURES. TOLERATED WELL

## 2012-04-17 NOTE — ED Provider Notes (Signed)
History     CSN: [id number]  Arrival date & time 04/17/12  0840   First MD Initiated Contact with Patient 04/17/12 0845      Chief Complaint  Patient presents with  . Suture / Staple Removal    (Consider location/radiation/quality/duration/timing/severity/associated sxs/prior treatment) HPI Comments: Patient presents for suture removal. The laceration is located on nasal bridge. The patient reports subjective healing of the laceration and denies any complications or concerns. The patient denies any pain, erythema, tenderness, drainage of the site. Denies fever, NVD, abdominal pain, numbness/tingling, skin color changes.    Patient is a 46 y.o. female presenting with suture removal.  Suture / Staple Removal     Past Medical History  Diagnosis Date  . Bipolar 1 disorder   . Depression     No past surgical history on file.  No family history on file.  History  Substance Use Topics  . Smoking status: Current Every Day Smoker  . Smokeless tobacco: Not on file  . Alcohol Use: 6.0 oz/week    10 Cans of beer per week    OB History    Grav Para Term Preterm Abortions TAB SAB Ect Mult Living                  Review of Systems  Skin: Positive for wound.  All other systems reviewed and are negative.    Allergies  Review of patient's allergies indicates no known allergies.  Home Medications   Current Outpatient Rx  Name  Route  Sig  Dispense  Refill  . FLUOXETINE HCL 20 MG PO CAPS   Oral   Take 20 mg by mouth daily.         . TRAZODONE HCL 100 MG PO TABS   Oral   Take 100 mg by mouth at bedtime.           There were no vitals taken for this visit.  Physical Exam  Nursing note and vitals reviewed. Constitutional: She appears well-developed and well-nourished. No distress.  HENT:  Head: Normocephalic and atraumatic.  Eyes: Conjunctivae normal are normal.  Neck: Normal range of motion. Neck supple.  Cardiovascular: Normal rate and regular rhythm.   Exam reveals no gallop and no friction rub.   No murmur heard. Pulmonary/Chest: Effort normal and breath sounds normal. She has no wheezes. She has no rales. She exhibits no tenderness.  Abdominal: Soft. There is no tenderness.  Musculoskeletal: Normal range of motion.  Neurological: She is alert.       Speech is goal-oriented. Moves limbs without ataxia.   Skin: Skin is warm and dry.       Sutures intact to bridge of nose with overlying scabbing.   Psychiatric: She has a normal mood and affect.    ED Course  Procedures (including critical care time)  SUTURE REMOVAL Performed by: Emilia Beck  Consent: Verbal consent obtained. Patient identity confirmed: provided demographic data Time out: Immediately prior to procedure a "time out" was called to verify the correct patient, procedure, equipment, support staff and site/side marked as required.  Location details: nasal bridge  Wound Appearance: clean  Sutures/Staples Removed: 4  Facility: sutures placed in this facility Patient tolerance: Patient tolerated the procedure well with no immediate complications.     Labs Reviewed - No data to display No results found.   1. Visit for suture removal       MDM  9:18 AM Sutures removed without complication. I removed 4 sutures but  previous notes say the patient had 6 sutures. I informed the patient to return with any complications.         Emilia Beck, PA-C 04/17/12 0922  Emilia Beck, PA-C 04/17/12 [phone]

## 2012-04-18 NOTE — ED Provider Notes (Signed)
Medical screening examination/treatment/procedure(s) were performed by non-physician practitioner and as supervising physician I was immediately available for consultation/collaboration.   Shelda Jakes, MD 04/18/12 [phone]

## 2012-08-04 ENCOUNTER — Encounter (HOSPITAL_COMMUNITY): Payer: Self-pay | Admitting: *Deleted

## 2012-08-04 ENCOUNTER — Inpatient Hospital Stay (HOSPITAL_COMMUNITY)
Admission: AD | Admit: 2012-08-04 | Discharge: 2012-08-04 | Disposition: A | Discharge status: Home / Self Care | Payer: Self-pay | Source: Ambulatory Visit | Attending: Obstetrics & Gynecology | Admitting: Obstetrics & Gynecology

## 2012-08-04 DIAGNOSIS — M545 Low back pain, unspecified: Secondary | ICD-10-CM | POA: Insufficient documentation

## 2012-08-04 DIAGNOSIS — R300 Dysuria: Secondary | ICD-10-CM | POA: Insufficient documentation

## 2012-08-04 DIAGNOSIS — N912 Amenorrhea, unspecified: Secondary | ICD-10-CM | POA: Insufficient documentation

## 2012-08-04 LAB — URINALYSIS, ROUTINE W REFLEX MICROSCOPIC
Glucose, UA: NEGATIVE mg/dL
Protein, ur: NEGATIVE mg/dL
Specific Gravity, Urine: <1.005 — ABNORMAL LOW (ref 1.005–1.030)

## 2012-08-04 LAB — URINE MICROSCOPIC-ADD ON

## 2012-08-04 NOTE — MAU Note (Signed)
Pt. Out at nurses station asking for discharge and bus pass. Friend "stepped out" and has not come back in. Pt. Instructed on importance of waiting for treatment/care. Pt. Agreeable at this time to stay.

## 2012-08-04 NOTE — MAU Note (Signed)
Pt's friend brought pt due to thinking pt looks pregnant and pt has not had period in 4-30months. Pt's speech is slurred and walking is staggered. Pt states she drinks 2quarts of beer daily and has drank that much today. Smokes 1pk to 1.5pks daily

## 2012-08-04 NOTE — MAU Note (Signed)
Pt. Here with friend because she has not had a period in 4months and looks pregnant.

## 2012-08-04 NOTE — MAU Note (Signed)
Pt. Out at desk again wanting bus pass and wanting to leave. Pt. Again instructed on importance of staying for treatment and care along with waiting for friend to come back. Pt. Unable to give phone number for friend that was here with her. Emergency contact from chart called and no answer. AMA papers signed due to patients insistence to leave and bus pass given as requested.

## 2012-08-04 NOTE — MAU Note (Addendum)
Haven't had a period for 4-5 months. No pain. No vag d/c.

## 2012-08-04 NOTE — MAU Provider Note (Signed)
  History     CSN: [id number]  Arrival date and time: 08/04/12 6578   First Provider Initiated Contact with Patient 08/04/12 2039      Chief Complaint  Patient presents with  . Amenorrhea   HPI Pt is a 47 yo here with report of amenorrhea x 5 months.  Prior to then was having regular cycles.  Also here due to concern for lower back pain pain x 3 days.  Reports dysuria and urinary frequency.  Denies hematuria.    Past Medical History  Diagnosis Date  . Bipolar 1 disorder   . Depression     History reviewed. No pertinent past surgical history.  No family history on file.  History  Substance Use Topics  . Smoking status: Current Every Day Smoker  . Smokeless tobacco: Not on file  . Alcohol Use: 6.0 oz/week    10 Cans of beer per week    Allergies: No Known Allergies  Prescriptions prior to admission  Medication Sig Dispense Refill  . FLUoxetine (PROZAC) 20 MG capsule Take 20 mg by mouth daily.      Marland Kitchen gabapentin (NEURONTIN) 300 MG capsule Take 300 mg by mouth 3 (three) times daily.      . traZODone (DESYREL) 100 MG tablet Take 100 mg by mouth at bedtime.        Review of Systems  Genitourinary: Positive for dysuria, urgency, frequency and flank pain.  Musculoskeletal: Positive for back pain.   Physical Exam   Blood pressure 109/63, pulse 104, temperature 98.8 F (37.1 C), resp. rate 20, SpO2 92.00%.  Physical Exam  Constitutional: She is oriented to person, place, and time.  HENT:  Head: Normocephalic.  Neck: Normal range of motion. Neck supple.  Cardiovascular: Normal rate, regular rhythm and normal heart sounds.   Respiratory: Effort normal and breath sounds normal.  Genitourinary: No bleeding around the vagina. Vaginal discharge (mucusy) found.  Neurological: She is alert and oriented to person, place, and time.  Skin: Skin is warm and dry.  Psychiatric: Her speech is slurred. She is not slowed.  Appears to be in her 80's   MAU Course   Procedures  While waiting for urine results pt left MAU  Assessment and Plan    [facility] 08/04/2012, 8:41 PM

## 2012-08-08 NOTE — MAU Provider Note (Signed)
Attestation of Attending Supervision of Advanced Practitioner (CNM/NP): Evaluation and management procedures were performed by the Advanced Practitioner under my supervision and collaboration. I have reviewed the Advanced Practitioner's note and chart, and I agree with the management and plan.  Cadience Bradfield H. 9:44 AM   

## 2013-04-22 ENCOUNTER — Inpatient Hospital Stay (HOSPITAL_COMMUNITY)
Admission: AD | Admit: 2013-04-22 | Discharge: 2013-04-22 | Disposition: A | Discharge status: Home / Self Care | Payer: Self-pay | Source: Ambulatory Visit | Attending: Obstetrics & Gynecology | Admitting: Obstetrics & Gynecology

## 2013-04-22 ENCOUNTER — Encounter (HOSPITAL_COMMUNITY): Payer: Self-pay

## 2013-04-22 DIAGNOSIS — N760 Acute vaginitis: Secondary | ICD-10-CM | POA: Insufficient documentation

## 2013-04-22 DIAGNOSIS — N9089 Other specified noninflammatory disorders of vulva and perineum: Secondary | ICD-10-CM

## 2013-04-22 DIAGNOSIS — N930 Postcoital and contact bleeding: Secondary | ICD-10-CM | POA: Insufficient documentation

## 2013-04-22 DIAGNOSIS — R300 Dysuria: Secondary | ICD-10-CM | POA: Insufficient documentation

## 2013-04-22 DIAGNOSIS — A499 Bacterial infection, unspecified: Secondary | ICD-10-CM | POA: Insufficient documentation

## 2013-04-22 DIAGNOSIS — N949 Unspecified condition associated with female genital organs and menstrual cycle: Secondary | ICD-10-CM | POA: Insufficient documentation

## 2013-04-22 DIAGNOSIS — B9689 Other specified bacterial agents as the cause of diseases classified elsewhere: Secondary | ICD-10-CM | POA: Insufficient documentation

## 2013-04-22 LAB — URINALYSIS, ROUTINE W REFLEX MICROSCOPIC
Bilirubin Urine: NEGATIVE
Glucose, UA: NEGATIVE mg/dL
Ketones, ur: NEGATIVE mg/dL
Protein, ur: NEGATIVE mg/dL
pH: 5 (ref 5.0–8.0)

## 2013-04-22 LAB — URINE MICROSCOPIC-ADD ON

## 2013-04-22 LAB — WET PREP, GENITAL
Trich, Wet Prep: NONE SEEN
Yeast Wet Prep HPF POC: NONE SEEN

## 2013-04-22 MED ORDER — ACYCLOVIR 400 MG PO TABS: 400.0000 mg | ORAL_TABLET | Freq: Three times a day (TID) | ORAL | Status: DC

## 2013-04-22 MED ORDER — METRONIDAZOLE 0.75 % VA GEL: 1.0000 | Freq: Every day | VAGINAL | Status: DC

## 2013-04-22 NOTE — MAU Note (Signed)
Pt presents via EMS complaining of vaginal bleeding x3 days with abdominal pain after intercourse. States she hasn't had a period in 8 months

## 2013-04-22 NOTE — MAU Provider Note (Signed)
History     CSN: [id number]  Arrival date and time: 04/22/13 1553   None     Chief Complaint  Patient presents with  . Vaginal Bleeding   HPI Debbie Grimes is 47 y.o. G2P2 presents with bleeding after intercourse 3 days ago.  States she is burning with urination. Came in for dysuria since yesterday.  "a little bleeding now" that she sees only when she wipes.  She is a clinic patient at [facility].  She admits to drinking 5 beers a day.  She is very thin and undernourished appearing.    Past Medical History  Diagnosis Date  . Bipolar 1 disorder   . Depression     History reviewed. No pertinent past surgical history.  History reviewed. No pertinent family history.  History  Substance Use Topics  . Smoking status: Current Every Day Smoker  . Smokeless tobacco: Not on file  . Alcohol Use: 6.0 oz/week    10 Cans of beer per week    Allergies: No Known Allergies  Prescriptions prior to admission  Medication Sig Dispense Refill  . traZODone (DESYREL) 100 MG tablet Take 100 mg by mouth at bedtime.        Review of Systems  Constitutional: Positive for chills. Negative for fever.  Gastrointestinal: Negative for abdominal pain.  Genitourinary: Negative for dysuria and urgency.       Vaginal irritation.   Physical Exam   Blood pressure 110/70, pulse 74, temperature 97.8 F (36.6 C), temperature source Oral, resp. rate 18.  Physical Exam  Constitutional: She is oriented to person, place, and time. She appears well-developed and well-nourished. No distress.  HENT:  Head: Normocephalic.  Neck: Normal range of motion.  Cardiovascular: Normal rate.   Respiratory: Effort normal.  GI: There is no tenderness. There is no rebound and no guarding.  Genitourinary: There is tenderness on the right labia. There is tenderness on the left labia. Uterus is not enlarged and not tender. Cervix exhibits no motion tenderness, no discharge and no friability. Right adnexum displays no mass,  no tenderness and no fullness. Left adnexum displays no mass, no tenderness and no fullness. No erythema or bleeding around the vagina. No vaginal discharge found.  There are multiple areas on the vulva and introitus that appear to be eccymotic with several areas that are open.  Not vesicular or ulcerative but redness with linear tears.  Tender with taking of culture  Neg for node enlargement in the groin.  Neurological: She is alert and oriented to person, place, and time.  Skin: Skin is warm and dry.  Psychiatric: She has a normal mood and affect. Her behavior is normal.   Results for orders placed during the hospital encounter of 04/22/13 (from the past 24 hour(s))  URINALYSIS, ROUTINE W REFLEX MICROSCOPIC     Status: Abnormal   Collection Time    04/22/13  4:00 PM      Result Value Range   Color, Urine STRAW (*) YELLOW   APPearance CLEAR  CLEAR   Specific Gravity, Urine <1.005 (*) 1.005 - 1.030   pH 5.0  5.0 - 8.0   Glucose, UA NEGATIVE  NEGATIVE mg/dL   Hgb urine dipstick TRACE (*) NEGATIVE   Bilirubin Urine NEGATIVE  NEGATIVE   Ketones, ur NEGATIVE  NEGATIVE mg/dL   Protein, ur NEGATIVE  NEGATIVE mg/dL   Urobilinogen, UA 0.2  0.0 - 1.0 mg/dL   Nitrite NEGATIVE  NEGATIVE   Leukocytes, UA NEGATIVE  NEGATIVE  URINE MICROSCOPIC-ADD ON     Status: None   Collection Time    04/22/13  4:00 PM      Result Value Range   Squamous Epithelial / LPF RARE  RARE   WBC, UA 3-6  <3 WBC/hpf   RBC / HPF 0-2  <3 RBC/hpf   Bacteria, UA RARE  RARE  POCT PREGNANCY, URINE     Status: None   Collection Time    04/22/13  4:19 PM      Result Value Range   Preg Test, Ur NEGATIVE  NEGATIVE  WET PREP, GENITAL     Status: Abnormal   Collection Time    04/22/13  4:45 PM      Result Value Range   Yeast Wet Prep HPF POC NONE SEEN  NONE SEEN   Trich, Wet Prep NONE SEEN  NONE SEEN   Clue Cells Wet Prep HPF POC MODERATE (*) NONE SEEN   WBC, Wet Prep HPF POC FEW (*) NONE SEEN   MAU Course  Procedures   HSV and GC/CHL cultures to lab  MDM Discussed physical findings with the patient.  Doubt HSV but b/c there are several lacerated area, cultured and will tx prophy.   Assessment and Plan  A: vaginal burning     Vaginal ecchymosis     Bacterial vaginosis     Rule out HSV  P:  Rx with CLindess and Acyclovir to pharmacy       Cultures will be reported to patient if positive     No sex until cultures back and pain resolves.            KEY,EVE M 04/22/2013, 5:50 PM

## 2013-04-24 LAB — HERPES SIMPLEX VIRUS CULTURE: Culture: NOT DETECTED

## 2013-05-22 ENCOUNTER — Encounter (HOSPITAL_COMMUNITY): Payer: Self-pay | Admitting: Emergency Medicine

## 2013-05-22 ENCOUNTER — Emergency Department (HOSPITAL_COMMUNITY)
Admission: EM | Admit: 2013-05-22 | Discharge: 2013-05-22 | Disposition: A | Discharge status: Home / Self Care | Payer: Self-pay | Attending: Emergency Medicine | Admitting: Emergency Medicine

## 2013-05-22 DIAGNOSIS — H6690 Otitis media, unspecified, unspecified ear: Secondary | ICD-10-CM

## 2013-05-22 DIAGNOSIS — F17200 Nicotine dependence, unspecified, uncomplicated: Secondary | ICD-10-CM | POA: Insufficient documentation

## 2013-05-22 DIAGNOSIS — F319 Bipolar disorder, unspecified: Secondary | ICD-10-CM | POA: Insufficient documentation

## 2013-05-22 DIAGNOSIS — H6590 Unspecified nonsuppurative otitis media, unspecified ear: Secondary | ICD-10-CM | POA: Insufficient documentation

## 2013-05-22 DIAGNOSIS — F1010 Alcohol abuse, uncomplicated: Secondary | ICD-10-CM | POA: Insufficient documentation

## 2013-05-22 DIAGNOSIS — F10929 Alcohol use, unspecified with intoxication, unspecified: Secondary | ICD-10-CM

## 2013-05-22 MED ORDER — PENICILLIN G BENZATHINE 1200000 UNIT/2ML IM SUSP
1.2000 10*6.[IU] | Freq: Once | INTRAMUSCULAR | Status: AC
  Administered 2013-05-22: 1.2 10*6.[IU] via INTRAMUSCULAR
  Filled 2013-05-22: qty 2

## 2013-05-22 MED ORDER — IBUPROFEN 800 MG PO TABS
800.0000 mg | ORAL_TABLET | Freq: Once | ORAL | Status: AC
  Administered 2013-05-22: 800 mg via ORAL
  Filled 2013-05-22: qty 1

## 2013-05-22 MED ORDER — IBUPROFEN 400 MG PO TABS: 400.0000 mg | ORAL_TABLET | Freq: Four times a day (QID) | ORAL | Status: DC | PRN

## 2013-05-22 NOTE — ED Provider Notes (Signed)
CSN: [id number]     Arrival date & time 05/22/13  1558 History   First MD Initiated Contact with Patient 05/22/13 1558     Chief Complaint  Patient presents with  . Alcohol Intoxication  . Otalgia   (Consider location/radiation/quality/duration/timing/severity/associated sxs/prior Treatment) HPI  Patient presents to the ER by EMS that was called by her friend. She admits to drinking 4-5 beers today because she has bilateral ear pain. She says that it has been hurting for a few days and she can not take it anymore. She has a PMH positive for bipolar 1 disorder and depression. The patient is tearful. She says that she doesn't have any family. She does not want to be treated for her ETOH and for any substance abuse problems. Denies SI/HI or hallucinations. Vital signs are stable, pt is awake, alert and oriented  Past Medical History  Diagnosis Date  . Bipolar 1 disorder   . Depression    History reviewed. No pertinent past surgical history. History reviewed. No pertinent family history. History  Substance Use Topics  . Smoking status: Current Every Day Smoker  . Smokeless tobacco: Not on file  . Alcohol Use: 6.0 oz/week    10 Cans of beer per week   OB History   Grav Para Term Preterm Abortions TAB SAB Ect Mult Living   2 2             Review of Systems The patient denies anorexia, fever, weight loss,, vision loss, decreased hearing, hoarseness, chest pain, syncope, dyspnea on exertion, peripheral edema, balance deficits, hemoptysis, abdominal pain, melena, hematochezia, severe indigestion/heartburn, hematuria, incontinence, genital sores, muscle weakness, suspicious skin lesions, transient blindness, difficulty walking, depression, unusual weight change, abnormal bleeding, enlarged lymph nodes, angioedema, and breast masses.  Allergies  Review of patient's allergies indicates no known allergies.  Home Medications   Current Outpatient Rx  Name  Route  Sig  Dispense  Refill  .  ibuprofen (ADVIL,MOTRIN) 400 MG tablet   Oral   Take 1 tablet (400 mg total) by mouth every 6 (six) hours as needed.   30 tablet   0    BP 118/65  Pulse 85  Temp(Src) 98.1 F (36.7 C) (Oral)  Resp 17  SpO2 95% Physical Exam  Nursing note and vitals reviewed. Constitutional: She is oriented to person, place, and time. She appears well-developed and well-nourished. No distress.  HENT:  Head: Normocephalic and atraumatic.  Right Ear: There is tenderness. No mastoid tenderness. A middle ear effusion is present.  Left Ear: There is tenderness. No mastoid tenderness. A middle ear effusion is present.  Eyes: Pupils are equal, round, and reactive to light.  Neck: Normal range of motion. Neck supple.  Cardiovascular: Normal rate and regular rhythm.   Pulmonary/Chest: Effort normal.  Abdominal: Soft.  Neurological: She is alert and oriented to person, place, and time.  Skin: Skin is warm and dry.  Psychiatric: Thought content is not delusional. She exhibits a depressed mood. She expresses no homicidal and no suicidal ideation.    ED Course  Procedures (including critical care time) Labs Review Labs Reviewed - No data to display Imaging Review No results found.  EKG Interpretation   None       MDM   1. Otitis media   2. Alcohol intoxication    Pt denies wanting help for anything other than her ear pain. Denies SI/HI  Penicillin shot and Ibuprofen given in ED for her pain and ear infection. Will give  rx for motrin. Patient lives with friend.  Pt does not have a ride home, will dc when sober  48 y.o.Debbie Grimes's evaluation in the Emergency Department is complete. It has been determined that no acute conditions requiring further emergency intervention are present at this time. The patient/guardian have been advised of the diagnosis and plan. We have discussed signs and symptoms that warrant return to the ED, such as changes or worsening in symptoms.  Vital signs are  stable at discharge. Filed Vitals:   05/22/13 1603  BP: 118/65  Pulse: 85  Temp: 98.1 F (36.7 C)  Resp: 17    Patient/guardian has voiced understanding and agreed to follow-up with the PCP or specialist.     Linus Mako, PA-C 05/22/13 1715  Linus Mako, PA-C 05/22/13 9518

## 2013-05-22 NOTE — ED Provider Notes (Signed)
Medical screening examination/treatment/procedure(s) were performed by non-physician practitioner and as supervising physician I was immediately available for consultation/collaboration.  EKG Interpretation   None         Blanchard Kelch, MD 05/22/13 2332

## 2013-05-22 NOTE — ED Notes (Signed)
Bed: WA15 Expected date:  Expected time:  Means of arrival:  Comments: ems 

## 2013-05-22 NOTE — ED Notes (Signed)
Per pt, c/o ear pain.  States pain is inside.  Adm,its to drinking 4-5 beers today.

## 2013-05-22 NOTE — Discharge Instructions (Signed)
Otitis Media, Adult Otitis media is redness, soreness, and swelling (inflammation) of the middle ear. Otitis media may be caused by allergies or, most commonly, by infection. Often it occurs as a complication of the common cold. SIGNS AND SYMPTOMS Symptoms of otitis media may include:  Earache.  Fever.  Ringing in your ear.  Headache.  Leakage of fluid from the ear. DIAGNOSIS To diagnose otitis media, your health care provider will examine your ear with an otoscope. This is an instrument that allows your health care provider to see into your ear in order to examine your eardrum. Your health care provider also will ask you questions about your symptoms. TREATMENT  Typically, otitis media resolves on its own within 3 5 days. Your health care provider may prescribe medicine to ease your symptoms of pain. If otitis media does not resolve within 5 days or is recurrent, your health care provider may prescribe antibiotic medicines if he or she suspects that a bacterial infection is the cause. HOME CARE INSTRUCTIONS   Take your medicine as directed until it is gone, even if you feel better after the first few days.  Only take over-the-counter or prescription medicines for pain, discomfort, or fever as directed by your health care provider.  Follow up with your health care provider as directed. SEEK MEDICAL CARE IF:  You have otitis media only in one ear or bleeding from your nose or both.  You notice a lump on your neck.  You are not getting better in 3 5 days.  You feel worse instead of better. SEEK IMMEDIATE MEDICAL CARE IF:   You have pain that is not controlled with medicine.  You have swelling, redness, or pain around your ear or stiffness in your neck.  You notice that part of your face is paralyzed.  You notice that the bone behind your ear (mastoid) is tender when you touch it. MAKE SURE YOU:   Understand these instructions.  Will watch your condition.  Will get help  right away if you are not doing well or get worse. Document Released: 01/15/2004 Document Revised: 01/30/2013 Document Reviewed: 11/06/2012 Exit[employer] Patient Information 2014 [employer].  

## 2013-05-22 NOTE — ED Notes (Signed)
Per EMS, pt has been drinking and disruptive at home.  Family called for EMS.  Pt with no specific complaints. Vitals:  120/74, hr 90, resp 18, cbg 103.

## 2013-06-05 ENCOUNTER — Emergency Department (HOSPITAL_COMMUNITY)
Admission: EM | Admit: 2013-06-05 | Discharge: 2013-06-05 | Disposition: A | Discharge status: Home / Self Care | Payer: Self-pay | Attending: Emergency Medicine | Admitting: Emergency Medicine

## 2013-06-05 ENCOUNTER — Emergency Department (HOSPITAL_COMMUNITY): Payer: Self-pay

## 2013-06-05 ENCOUNTER — Encounter (HOSPITAL_COMMUNITY): Payer: Self-pay | Admitting: Emergency Medicine

## 2013-06-05 DIAGNOSIS — F1010 Alcohol abuse, uncomplicated: Secondary | ICD-10-CM | POA: Insufficient documentation

## 2013-06-05 DIAGNOSIS — F17200 Nicotine dependence, unspecified, uncomplicated: Secondary | ICD-10-CM | POA: Insufficient documentation

## 2013-06-05 DIAGNOSIS — M549 Dorsalgia, unspecified: Secondary | ICD-10-CM | POA: Insufficient documentation

## 2013-06-05 DIAGNOSIS — Z8659 Personal history of other mental and behavioral disorders: Secondary | ICD-10-CM | POA: Insufficient documentation

## 2013-06-05 DIAGNOSIS — R209 Unspecified disturbances of skin sensation: Secondary | ICD-10-CM | POA: Insufficient documentation

## 2013-06-05 DIAGNOSIS — F10929 Alcohol use, unspecified with intoxication, unspecified: Secondary | ICD-10-CM

## 2013-06-05 DIAGNOSIS — G8929 Other chronic pain: Secondary | ICD-10-CM | POA: Insufficient documentation

## 2013-06-05 DIAGNOSIS — M79609 Pain in unspecified limb: Secondary | ICD-10-CM | POA: Insufficient documentation

## 2013-06-05 LAB — COMPREHENSIVE METABOLIC PANEL
ALT: 56 U/L — ABNORMAL HIGH (ref 0–35)
AST: 122 U/L — AB (ref 0–37)
Albumin: 4.1 g/dL (ref 3.5–5.2)
Alkaline Phosphatase: 84 U/L (ref 39–117)
BILIRUBIN TOTAL: 0.3 mg/dL (ref 0.3–1.2)
BUN: 10 mg/dL (ref 6–23)
CALCIUM: 8.7 mg/dL (ref 8.4–10.5)
CO2: 22 mEq/L (ref 19–32)
CREATININE: 0.69 mg/dL (ref 0.50–1.10)
Chloride: 102 mEq/L (ref 96–112)
GFR calc Af Amer: >90 mL/min (ref >90)
GFR calc non Af Amer: >90 mL/min (ref >90)
Glucose, Bld: 77 mg/dL (ref 70–99)
Potassium: 3.8 mEq/L (ref 3.7–5.3)
Sodium: 144 mEq/L (ref 137–147)
Total Protein: 8.5 g/dL — ABNORMAL HIGH (ref 6.0–8.3)

## 2013-06-05 LAB — CBC WITH DIFFERENTIAL/PLATELET
BASOS ABS: 0.1 10*3/uL (ref 0.0–0.1)
BASOS PCT: 1 % (ref 0–1)
Eosinophils Absolute: 0.2 10*3/uL (ref 0.0–0.7)
Eosinophils Relative: 3 % (ref 0–5)
HEMATOCRIT: 37.5 % (ref 36.0–46.0)
Hemoglobin: 13.3 g/dL (ref 12.0–15.0)
Lymphocytes Relative: 41 % (ref 12–46)
Lymphs Abs: 2.8 10*3/uL (ref 0.7–4.0)
MCH: 34.6 pg — ABNORMAL HIGH (ref 26.0–34.0)
MCHC: 35.5 g/dL (ref 30.0–36.0)
MCV: 97.7 fL (ref 78.0–100.0)
MONO ABS: 0.6 10*3/uL (ref 0.1–1.0)
Monocytes Relative: 9 % (ref 3–12)
NEUTROS ABS: 3.2 10*3/uL (ref 1.7–7.7)
NEUTROS PCT: 46 % (ref 43–77)
PLATELETS: 266 10*3/uL (ref 150–400)
RBC: 3.84 MIL/uL — ABNORMAL LOW (ref 3.87–5.11)
RDW: 15.4 % (ref 11.5–15.5)
WBC: 6.9 10*3/uL (ref 4.0–10.5)

## 2013-06-05 LAB — URINALYSIS, ROUTINE W REFLEX MICROSCOPIC
Bilirubin Urine: NEGATIVE
Glucose, UA: NEGATIVE mg/dL
Ketones, ur: NEGATIVE mg/dL
Nitrite: NEGATIVE
Protein, ur: NEGATIVE mg/dL
SPECIFIC GRAVITY, URINE: 1.014 (ref 1.005–1.030)
Urobilinogen, UA: 0.2 mg/dL (ref 0.0–1.0)
pH: 5.5 (ref 5.0–8.0)

## 2013-06-05 LAB — RAPID URINE DRUG SCREEN, HOSP PERFORMED
AMPHETAMINES: NOT DETECTED
Barbiturates: NOT DETECTED
Benzodiazepines: NOT DETECTED
COCAINE: NOT DETECTED
OPIATES: NOT DETECTED
TETRAHYDROCANNABINOL: POSITIVE — AB

## 2013-06-05 LAB — URINE MICROSCOPIC-ADD ON

## 2013-06-05 LAB — ETHANOL: Alcohol, Ethyl (B): 456 mg/dL (ref 0–11)

## 2013-06-05 NOTE — ED Provider Notes (Addendum)
CSN: [id number]     Arrival date & time 06/05/13  1141 History   First MD Initiated Contact with Patient 06/05/13 1153     Chief Complaint  Patient presents with  . Difficulty Walking  . Numbness     (Consider location/radiation/quality/duration/timing/severity/associated sxs/prior Treatment) HPI Comments: Comes to the ER for evaluation of leg pain. Patient reports that she has been having pain in her back and legs for 3 years. She reports severe pain in her feet, and that the pain worsens when she tries to walk. This has caused her to fall. Patient calling him today because of his leg and lateral foot pain. She admits to drinking alcohol daily.   Past Medical History  Diagnosis Date  . Bipolar 1 disorder   . Depression    History reviewed. No pertinent past surgical history. No family history on file. History  Substance Use Topics  . Smoking status: Current Every Day Smoker  . Smokeless tobacco: Not on file  . Alcohol Use: 6.0 oz/week    10 Cans of beer per week     Comment: reports 1/5 liquor daily   OB History   Grav Para Term Preterm Abortions TAB SAB Ect Mult Living   2 2             Review of Systems  Musculoskeletal: Positive for back pain and gait problem.  Neurological: Positive for numbness.  All other systems reviewed and are negative.      Allergies  Review of patient's allergies indicates no known allergies.  Home Medications   Current Outpatient Rx  Name  Route  Sig  Dispense  Refill  . ibuprofen (ADVIL,MOTRIN) 200 MG tablet   Oral   Take 400-600 mg by mouth every 6 (six) hours as needed for moderate pain.          BP 121/81  Pulse 100  Temp(Src) 98.1 F (36.7 C) (Oral)  Resp 16  SpO2 99% Physical Exam  Constitutional: She is oriented to person, place, and time. She appears well-developed and well-nourished. She appears distressed (tearful).  HENT:  Head: Normocephalic and atraumatic.  Right Ear: Hearing normal.  Left Ear: Hearing  normal.  Nose: Nose normal.  Mouth/Throat: Oropharynx is clear and moist and mucous membranes are normal.  Eyes: Conjunctivae and EOM are normal. Pupils are equal, round, and reactive to light.  Neck: Normal range of motion. Neck supple.  Cardiovascular: Regular rhythm, S1 normal and S2 normal.  Exam reveals no gallop and no friction rub.   No murmur heard. Pulses:      Dorsalis pedis pulses are 2+ on the right side, and 2+ on the left side.  Pulmonary/Chest: Effort normal and breath sounds normal. No respiratory distress. She exhibits no tenderness.  Abdominal: Soft. Normal appearance and bowel sounds are normal. There is no hepatosplenomegaly. There is no tenderness. There is no rebound, no guarding, no tenderness at McBurney's point and negative Murphy's sign. No hernia.  Musculoskeletal: Normal range of motion.  Neurological: She is alert and oriented to person, place, and time. She has normal strength. No cranial nerve deficit or sensory deficit. Coordination normal. GCS eye subscore is 4. GCS verbal subscore is 5. GCS motor subscore is 6.  Hyperesthesia to touch on bilateral feet and lower legs, sensation intact  Lower extremity strength 5/5 equal  Skin: Skin is warm, dry and intact. No rash noted. No cyanosis.  Skin normal, no rash or signs of infection  Psychiatric: She has a normal  mood and affect. Her speech is normal and behavior is normal. Thought content normal.    ED Course  Procedures (including critical care time) Labs Review Labs Reviewed  CBC WITH DIFFERENTIAL - Abnormal; Notable for the following:    RBC 3.84 (*)    MCH 34.6 (*)    All other components within normal limits  COMPREHENSIVE METABOLIC PANEL - Abnormal; Notable for the following:    Total Protein 8.5 (*)    AST 122 (*)    ALT 56 (*)    All other components within normal limits  URINALYSIS, ROUTINE W REFLEX MICROSCOPIC - Abnormal; Notable for the following:    Hgb urine dipstick TRACE (*)     Leukocytes, UA SMALL (*)    All other components within normal limits  ETHANOL - Abnormal; Notable for the following:    Alcohol, Ethyl (B) 456 (*)    All other components within normal limits  URINE MICROSCOPIC-ADD ON - Abnormal; Notable for the following:    Squamous Epithelial / LPF MANY (*)    All other components within normal limits  URINE RAPID DRUG SCREEN (HOSP PERFORMED)   Imaging Review Dg Lumbar Spine Complete  06/05/2013   CLINICAL DATA:  Back pain  EXAM: LUMBAR SPINE - COMPLETE 4+ VIEW  COMPARISON:  10/26/2008  FINDINGS: There is curvature convex to the right in the upper lumbar region and to the left in the lower lumbar region. There is mild disc space narrowing throughout the lumbar spine. There is mild lower lumbar facet degeneration. No evidence of fracture. There is premature arterial calcification.  IMPRESSION: Spinal curvature. Degenerative disc disease and degenerative facet disease. Premature atherosclerosis.   Electronically Signed   By: Nelson Chimes M.D.   On: 06/05/2013 13:00   Dg Pelvis 1-2 Views  06/05/2013   CLINICAL DATA:  Low back and pelvic pain.  EXAM: PELVIS - 1-2 VIEW  COMPARISON:  None.  FINDINGS: No bony abnormality. No articular abnormality. Arterial calcification is noted, premature for age.  IMPRESSION: No bony abnormality.  Premature atherosclerosis.   Electronically Signed   By: Nelson Chimes M.D.   On: 06/05/2013 12:59    EKG Interpretation   None       MDM   Diagnosis: 1. Alcohol intoxication 2. Chronic pain  Patient presents to the ER for evaluation of pain and numbness in the feet and legs for 3 years. She is obviously intoxicated. Her examination revealed hyperesthesia without any focal deficits. Equal strength in the lower extremities with normal sensation. Workup was otherwise unremarkable. Patient is significantly intoxicated with alcohol level of 456. She will be allowed to sober up, then reevaluate to see if he wants help with her alcohol  problem.    Orpah Greek, MD 06/05/13 [place], MD 06/05/13 [phone]

## 2013-06-05 NOTE — ED Notes (Signed)
Per EMS pt came from home c/o bilateral numbness to legs x3 years that has recently caused difficulty walking. Pt tearful upon arrival, admits to drinking ETOH today. Pt able to move independently from EMS stretcher to ED stretcher. NAD noted.

## 2013-06-05 NOTE — ED Notes (Signed)
Meal tray ordered 

## 2013-06-05 NOTE — ED Notes (Addendum)
Feet are very tender to touch. Positive movement, pulses and sensation bilaterally. Cap refill <3 seconds. MD at bedside for assessment.

## 2013-06-05 NOTE — Discharge Instructions (Signed)
Alcohol Intoxication °Alcohol intoxication occurs when the amount of alcohol that a person has consumed impairs his or her ability to mentally and physically function. Alcohol directly impairs the normal chemical activity of the brain. Drinking large amounts of alcohol can lead to changes in mental function and behavior, and it can cause many physical effects that can be harmful.  °Alcohol intoxication can range in severity from mild to very severe. Various factors can affect the level of intoxication that occurs, such as the person's age, gender, weight, frequency of alcohol consumption, and the presence of other medical conditions (such as diabetes, seizures, or heart conditions). Dangerous levels of alcohol intoxication may occur when people drink large amounts of alcohol in a short period (binge drinking). Alcohol can also be especially dangerous when combined with certain prescription medicines or "recreational" drugs. °SIGNS AND SYMPTOMS °Some common signs and symptoms of mild alcohol intoxication include: °· Loss of coordination. °· Changes in mood and behavior. °· Impaired judgment. °· Slurred speech. °As alcohol intoxication progresses to more severe levels, other signs and symptoms will appear. These may include: °· Vomiting. °· Confusion and impaired memory. °· Slowed breathing. °· Seizures. °· Loss of consciousness. °DIAGNOSIS  °Your health care provider will take a medical history and perform a physical exam. You will be asked about the amount and type of alcohol you have consumed. Blood tests will be done to measure the concentration of alcohol in your blood. In many places, your blood alcohol level must be lower than 80 mg/dL (0.08%) to legally drive. However, many dangerous effects of alcohol can occur at much lower levels.  °TREATMENT  °People with alcohol intoxication often do not require treatment. Most of the effects of alcohol intoxication are temporary, and they go away as the alcohol naturally  leaves the body. Your health care provider will monitor your condition until you are stable enough to go home. Fluids are sometimes given through an IV access tube to help prevent dehydration.  °HOME CARE INSTRUCTIONS °· Do not drive after drinking alcohol. °· Stay hydrated. Drink enough water and fluids to keep your urine clear or pale yellow. Avoid caffeine.   °· Only take over-the-counter or prescription medicines as directed by your health care provider.   °SEEK MEDICAL CARE IF:  °· You have persistent vomiting.   °· You do not feel better after a few days. °· You have frequent alcohol intoxication. Your health care provider can help determine if you should see a substance use treatment counselor. °SEEK IMMEDIATE MEDICAL CARE IF:  °· You become shaky or tremble when you try to stop drinking.   °· You shake uncontrollably (seizure).   °· You throw up (vomit) blood. This may be bright red or may look like black coffee grounds.   °· You have blood in your stool. This may be bright red or may appear as a black, tarry, bad smelling stool.   °· You become lightheaded or faint.   °MAKE SURE YOU:  °· Understand these instructions. °· Will watch your condition. °· Will get help right away if you are not doing well or get worse. °Document Released: 01/19/2005 Document Revised: 12/12/2012 Document Reviewed: 09/14/2012 °[employer]® Patient Information ©2014 [employer]. ° °

## 2013-06-05 NOTE — ED Provider Notes (Signed)
Patient is able to ambulate and requesting to go home. She is tolerating by mouth. Examination of her legs is intact distal pulses, intact strength and sensation.  She is oriented x 3, not suicidal or homicidal.   BP 104/66  Pulse 78  Temp(Src) 98.1 F (36.7 C) (Oral)  Resp 16  SpO2 98%   Ezequiel Essex, MD 06/05/13 1859

## 2013-11-26 ENCOUNTER — Encounter (HOSPITAL_COMMUNITY): Payer: Self-pay | Admitting: Emergency Medicine

## 2013-11-26 ENCOUNTER — Emergency Department (HOSPITAL_COMMUNITY)
Admission: EM | Admit: 2013-11-26 | Discharge: 2013-11-26 | Discharge status: Against Medical Advice | Payer: Self-pay | Attending: Emergency Medicine | Admitting: Emergency Medicine

## 2013-11-26 DIAGNOSIS — F17200 Nicotine dependence, unspecified, uncomplicated: Secondary | ICD-10-CM | POA: Insufficient documentation

## 2013-11-26 DIAGNOSIS — F1010 Alcohol abuse, uncomplicated: Secondary | ICD-10-CM | POA: Insufficient documentation

## 2013-11-26 DIAGNOSIS — R1110 Vomiting, unspecified: Secondary | ICD-10-CM | POA: Insufficient documentation

## 2013-11-26 NOTE — ED Notes (Signed)
Pt walked out of ED, outside of ED, 2nd time pt has walked out. First time redirected back to room. Pt stated she was leaving and left cutting eye at staff in triage.

## 2013-11-26 NOTE — ED Notes (Addendum)
Pt just walked out of room states she has been here over an hour ago and is tired of waiting, would like a bus pass, or something to drink.  Pt has only been here 20 min.

## 2013-11-26 NOTE — ED Notes (Addendum)
Per EMS: Pt picked up at home, c/o vomiting. Pt is intoxicated. Pt was has stumbling gate. Pt states she has been drinking all day.

## 2014-01-27 ENCOUNTER — Emergency Department (HOSPITAL_COMMUNITY): Payer: Medicaid Other

## 2014-01-27 ENCOUNTER — Encounter (HOSPITAL_COMMUNITY): Payer: Self-pay | Admitting: Emergency Medicine

## 2014-01-27 ENCOUNTER — Emergency Department (HOSPITAL_COMMUNITY)
Admission: EM | Admit: 2014-01-27 | Discharge: 2014-01-27 | Disposition: A | Discharge status: Home / Self Care | Payer: Medicaid Other | Attending: Emergency Medicine | Admitting: Emergency Medicine

## 2014-01-27 DIAGNOSIS — G8929 Other chronic pain: Secondary | ICD-10-CM | POA: Insufficient documentation

## 2014-01-27 DIAGNOSIS — Z720 Tobacco use: Secondary | ICD-10-CM | POA: Diagnosis not present

## 2014-01-27 DIAGNOSIS — M79672 Pain in left foot: Secondary | ICD-10-CM | POA: Diagnosis not present

## 2014-01-27 DIAGNOSIS — Z791 Long term (current) use of non-steroidal anti-inflammatories (NSAID): Secondary | ICD-10-CM | POA: Insufficient documentation

## 2014-01-27 DIAGNOSIS — F1020 Alcohol dependence, uncomplicated: Secondary | ICD-10-CM | POA: Diagnosis not present

## 2014-01-27 DIAGNOSIS — J449 Chronic obstructive pulmonary disease, unspecified: Secondary | ICD-10-CM

## 2014-01-27 DIAGNOSIS — M25471 Effusion, right ankle: Secondary | ICD-10-CM | POA: Diagnosis not present

## 2014-01-27 DIAGNOSIS — J441 Chronic obstructive pulmonary disease with (acute) exacerbation: Secondary | ICD-10-CM | POA: Insufficient documentation

## 2014-01-27 DIAGNOSIS — R109 Unspecified abdominal pain: Secondary | ICD-10-CM | POA: Insufficient documentation

## 2014-01-27 DIAGNOSIS — M25472 Effusion, left ankle: Secondary | ICD-10-CM | POA: Diagnosis not present

## 2014-01-27 DIAGNOSIS — Z8659 Personal history of other mental and behavioral disorders: Secondary | ICD-10-CM | POA: Diagnosis not present

## 2014-01-27 DIAGNOSIS — Z8719 Personal history of other diseases of the digestive system: Secondary | ICD-10-CM | POA: Insufficient documentation

## 2014-01-27 DIAGNOSIS — M79671 Pain in right foot: Secondary | ICD-10-CM | POA: Diagnosis present

## 2014-01-27 DIAGNOSIS — M7989 Other specified soft tissue disorders: Secondary | ICD-10-CM

## 2014-01-27 LAB — CBC WITH DIFFERENTIAL/PLATELET
Basophils Absolute: 0.1 10*3/uL (ref 0.0–0.1)
Basophils Relative: 2 % — ABNORMAL HIGH (ref 0–1)
Eosinophils Absolute: 0.2 10*3/uL (ref 0.0–0.7)
Eosinophils Relative: 3 % (ref 0–5)
HEMATOCRIT: 29.7 % — AB (ref 36.0–46.0)
Hemoglobin: 9.9 g/dL — ABNORMAL LOW (ref 12.0–15.0)
LYMPHS ABS: 2.3 10*3/uL (ref 0.7–4.0)
LYMPHS PCT: 38 % (ref 12–46)
MCH: 30.3 pg (ref 26.0–34.0)
MCHC: 33.3 g/dL (ref 30.0–36.0)
MCV: 90.8 fL (ref 78.0–100.0)
MONO ABS: 0.6 10*3/uL (ref 0.1–1.0)
Monocytes Relative: 10 % (ref 3–12)
Neutro Abs: 2.8 10*3/uL (ref 1.7–7.7)
Neutrophils Relative %: 47 % (ref 43–77)
Platelets: 276 10*3/uL (ref 150–400)
RBC: 3.27 MIL/uL — AB (ref 3.87–5.11)
RDW: 17.6 % — ABNORMAL HIGH (ref 11.5–15.5)
WBC: 6 10*3/uL (ref 4.0–10.5)

## 2014-01-27 LAB — COMPREHENSIVE METABOLIC PANEL
ALT: 18 U/L (ref 0–35)
AST: 49 U/L — ABNORMAL HIGH (ref 0–37)
Albumin: 3.5 g/dL (ref 3.5–5.2)
Alkaline Phosphatase: 67 U/L (ref 39–117)
Anion gap: 15 (ref 5–15)
BUN: 10 mg/dL (ref 6–23)
CALCIUM: 8.5 mg/dL (ref 8.4–10.5)
CO2: 21 mEq/L (ref 19–32)
CREATININE: 0.61 mg/dL (ref 0.50–1.10)
Chloride: 102 mEq/L (ref 96–112)
GLUCOSE: 84 mg/dL (ref 70–99)
Potassium: 4.3 mEq/L (ref 3.7–5.3)
SODIUM: 138 meq/L (ref 137–147)
Total Bilirubin: <0.2 mg/dL — ABNORMAL LOW (ref 0.3–1.2)
Total Protein: 7.5 g/dL (ref 6.0–8.3)

## 2014-01-27 LAB — URINE MICROSCOPIC-ADD ON

## 2014-01-27 LAB — URINALYSIS, ROUTINE W REFLEX MICROSCOPIC
Bilirubin Urine: NEGATIVE
Glucose, UA: NEGATIVE mg/dL
HGB URINE DIPSTICK: NEGATIVE
Ketones, ur: NEGATIVE mg/dL
Nitrite: NEGATIVE
PROTEIN: NEGATIVE mg/dL
Specific Gravity, Urine: 1.005 (ref 1.005–1.030)
Urobilinogen, UA: 0.2 mg/dL (ref 0.0–1.0)
pH: 5.5 (ref 5.0–8.0)

## 2014-01-27 LAB — PRO B NATRIURETIC PEPTIDE: PRO B NATRI PEPTIDE: 43 pg/mL (ref 0–125)

## 2014-01-27 LAB — ETHANOL: Alcohol, Ethyl (B): 314 mg/dL — ABNORMAL HIGH (ref 0–11)

## 2014-01-27 LAB — I-STAT TROPONIN, ED: Troponin i, poc: 0 ng/mL (ref 0.00–0.08)

## 2014-01-27 MED ORDER — ALBUTEROL SULFATE HFA 108 (90 BASE) MCG/ACT IN AERS
2.0000 | INHALATION_SPRAY | Freq: Once | RESPIRATORY_TRACT | Status: AC
  Administered 2014-01-27: 2 via RESPIRATORY_TRACT
  Filled 2014-01-27: qty 6.7

## 2014-01-27 NOTE — ED Notes (Signed)
Pt having pedal edema- new for this pt. No hx of CHF. Pain 10/10 in bilateral legs/feet. Pain ongoing for 3-4 days. Pt ambulatory. Pt has hx of bipolar, depressive disorder, admits to drinking a lot today to EMS. BP 122/62, HR 80, CBG 99

## 2014-01-27 NOTE — Discharge Instructions (Signed)

## 2014-01-27 NOTE — ED Provider Notes (Signed)
CSN: [id number]     Arrival date & time 01/27/14  1847 History   First MD Initiated Contact with Patient 01/27/14 1856     Chief Complaint  Patient presents with  . Foot Pain     (Consider location/radiation/quality/duration/timing/severity/associated sxs/prior Treatment) Patient is a 48 y.o. female presenting with leg pain.  Leg Pain Location:  Leg Time since incident:  4 days Injury: no   Leg location:  L lower leg and R lower leg Pain details:    Quality:  Aching   Radiates to:  Does not radiate   Severity:  Severe   Onset quality:  Gradual   Duration:  4 days   Timing:  Constant   Progression:  Unchanged Chronicity:  New Dislocation: no   Foreign body present:  No foreign bodies Prior injury to area:  No Relieved by:  Nothing Worsened by:  Nothing tried Ineffective treatments:  NSAIDs Associated symptoms: no fever   Associated symptoms comment:  Sob, wheezing Risk factors: no recent illness     Past Medical History  Diagnosis Date  . Bipolar 1 disorder   . Depression    History reviewed. No pertinent past surgical history. History reviewed. No pertinent family history. History  Substance Use Topics  . Smoking status: Current Every Day Smoker    Types: Cigarettes  . Smokeless tobacco: Not on file  . Alcohol Use: 6.0 oz/week    10 Cans of beer per week     Comment: reports 1/5 liquor daily   OB History   Grav Para Term Preterm Abortions TAB SAB Ect Mult Living   2 2             Review of Systems  Constitutional: Negative for fever and chills.  HENT: Negative for congestion and sore throat.   Eyes: Negative for visual disturbance.  Respiratory: Positive for shortness of breath and wheezing. Negative for cough and chest tightness.   Cardiovascular: Positive for leg swelling. Negative for chest pain.  Gastrointestinal: Negative for nausea, vomiting, abdominal pain, diarrhea and constipation.  Genitourinary: Negative for dysuria, difficulty urinating and  vaginal pain.  Musculoskeletal: Positive for myalgias. Negative for arthralgias and gait problem.  Skin: Negative for rash.  Neurological: Negative for syncope and headaches.  Psychiatric/Behavioral: Negative for behavioral problems.  All other systems reviewed and are negative.     Allergies  Review of patient's allergies indicates no known allergies.  Home Medications   Prior to Admission medications   Medication Sig Start Date End Date Taking? Authorizing Provider  meloxicam (MOBIC) 7.5 MG tablet Take 7.5 mg by mouth. 01/13/14 01/13/15 Yes Historical Provider, MD  ibuprofen (ADVIL,MOTRIN) 200 MG tablet Take 400-600 mg by mouth every 6 (six) hours as needed for moderate pain.    Historical Provider, MD   BP 100/79  Pulse 94  Temp(Src) 98 F (36.7 C) (Oral)  Resp 14  Ht 5\' 7"  (1.702 m)  Wt 107 lb (48.535 kg)  BMI 16.75 kg/m2  SpO2 97% Physical Exam  Constitutional: She is oriented to person, place, and time. She appears well-developed and well-nourished. No distress.  HENT:  Head: Normocephalic and atraumatic.  Eyes: EOM are normal.  Neck: Normal range of motion.  Cardiovascular: Normal rate, regular rhythm and normal heart sounds.   No murmur heard. Pulmonary/Chest: Effort normal. No respiratory distress. She has wheezes.  Abdominal: Soft. There is no tenderness.  Musculoskeletal: She exhibits no edema.       Right ankle: She exhibits swelling.  Left ankle: She exhibits swelling.  Neurological: She is alert and oriented to person, place, and time.  Skin: She is not diaphoretic.  Psychiatric: She has a normal mood and affect. Her behavior is normal.    ED Course  Procedures (including critical care time) Labs Review Labs Reviewed  CBC WITH DIFFERENTIAL - Abnormal; Notable for the following:    RBC 3.27 (*)    Hemoglobin 9.9 (*)    HCT 29.7 (*)    RDW 17.6 (*)    Basophils Relative 2 (*)    All other components within normal limits  COMPREHENSIVE METABOLIC  PANEL - Abnormal; Notable for the following:    AST 49 (*)    Total Bilirubin <0.2 (*)    All other components within normal limits  ETHANOL - Abnormal; Notable for the following:    Alcohol, Ethyl (B) 314 (*)    All other components within normal limits  URINALYSIS, ROUTINE W REFLEX MICROSCOPIC - Abnormal; Notable for the following:    Leukocytes, UA SMALL (*)    All other components within normal limits  URINE MICROSCOPIC-ADD ON - Abnormal; Notable for the following:    Squamous Epithelial / LPF FEW (*)    Bacteria, UA MANY (*)    All other components within normal limits  PRO B NATRIURETIC PEPTIDE  I-STAT TROPOININ, ED    Imaging Review Dg Chest 2 View  01/27/2014   CLINICAL DATA:  Shortness of breath with peripheral edema ; no history of CHF; current tobacco use  EXAM: CHEST  2 VIEW  COMPARISON:  PA and lateral chest of March 09, 2009  FINDINGS: The lungs are mildly hyperinflated. There is increased density in the left lower lobe posteriorly. The heart and pulmonary vascularity are normal. The mediastinum is normal in width. There is no pleural effusion. The bony thorax is unremarkable.  IMPRESSION: COPD with left lower lobe atelectasis or pneumonia. There is no evidence of CHF.   Electronically Signed   By: David  Martinique   On: 01/27/2014 20:20     EKG Interpretation None      MDM   Final diagnoses:  Swelling of lower extremity  Chronic obstructive pulmonary disease, unspecified COPD, unspecified chronic bronchitis type    Patient is a 48 year old female with history of chronic alcohol abuse, COPD, cirrhosis per her report that presents with bilateral lower [place] swelling and shortness of breath.  Patient says 4 days ago she started having swelling in her lower extremities and increasing pain. 2 days ago the patient began having shortness of breath with wheezing. Patient states she does not have an inhaler due to financial reasons. On exam patient has +1 edema  bilaterally. Patient has diffuse wheezing. Patient is afebrile with stable vital signs. Patient given albuterol inhaler and states that she has complete resolution of her shortness of breath. Patient labs as seen above showed no significant cause for her lower extremity swelling, specifically a negative BNP normal creatinine, normal albumin. Patient is advised to use anti-inflammatories he keep her legs elevated. If this continues to tried TED hose. Patient given resources to establish with a primary care physician. Patient states he does want detox however asked to see her family tonight and she will be back tomorrow to start alcohol detox program.    Renne Musca, MD 01/28/14 [phone]

## 2014-01-27 NOTE — ED Notes (Signed)
Pt remains monitored by blood pressure, pulse ox, and 5 lead.  

## 2014-01-27 NOTE — ED Notes (Signed)
Pt given a bus pass at dc.

## 2014-01-28 NOTE — ED Provider Notes (Signed)
I saw and evaluated the patient, reviewed the resident's note and I agree with the findings and plan.   EKG Interpretation None      Debbie Grimes is a 48 y.o. female hx of alcohol abuse, COPD, cirrhosis here with leg swelling, shortness of breath. Has chronic ab pain and has been self medicating with alcohol. Worsening bilateral leg swelling for the last 2-3 days. Also subjective shortness of breath. On exam, minimal wheezing. 1+ edema bilaterally. Abdomen soft, nontender. Labs at baseline, ETOH 300, BNP nl, CXR showed no CHF. Patient doesn't want detox tonight and will be going home by bus. Steady gait on discharge.    Wandra Arthurs, MD 01/28/14 2201

## 2014-02-24 ENCOUNTER — Encounter (HOSPITAL_COMMUNITY): Payer: Self-pay | Admitting: Emergency Medicine

## 2014-11-12 ENCOUNTER — Encounter (HOSPITAL_COMMUNITY): Payer: Self-pay | Admitting: Emergency Medicine

## 2014-11-12 ENCOUNTER — Emergency Department (INDEPENDENT_AMBULATORY_CARE_PROVIDER_SITE_OTHER)
Admission: EM | Admit: 2014-11-12 | Discharge: 2014-11-12 | Disposition: A | Discharge status: Home / Self Care | Payer: Medicaid Other | Source: Home / Self Care | Attending: Emergency Medicine | Admitting: Emergency Medicine

## 2014-11-12 DIAGNOSIS — Z23 Encounter for immunization: Secondary | ICD-10-CM

## 2014-11-12 DIAGNOSIS — S40812A Abrasion of left upper arm, initial encounter: Secondary | ICD-10-CM

## 2014-11-12 MED ORDER — TETANUS-DIPHTH-ACELL PERTUSSIS 5-2.5-18.5 LF-MCG/0.5 IM SUSP
0.5000 mL | Freq: Once | INTRAMUSCULAR | Status: AC
  Administered 2014-11-12: 0.5 mL via INTRAMUSCULAR

## 2014-11-12 MED ORDER — BACITRACIN ZINC 500 UNIT/GM EX OINT
TOPICAL_OINTMENT | CUTANEOUS | Status: AC
  Filled 2014-11-12: qty 2.7

## 2014-11-12 MED ORDER — MUPIROCIN 2 % EX OINT: 1.0000 "application " | TOPICAL_OINTMENT | Freq: Two times a day (BID) | CUTANEOUS | Status: DC
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
## 2014-11-12 NOTE — Discharge Instructions (Signed)
We have updated your tetanus today. Please wash the cut with soap and water twice a day. Apply mupirocin ointment twice a day. Ice will help with the bruising. Follow-up as needed.
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
## 2014-11-12 NOTE — ED Notes (Signed)
Waiting on tdap from pharmacy

## 2015-05-31 ENCOUNTER — Emergency Department (HOSPITAL_COMMUNITY): Payer: No Typology Code available for payment source

## 2015-05-31 ENCOUNTER — Encounter (HOSPITAL_COMMUNITY): Payer: Self-pay

## 2015-05-31 ENCOUNTER — Emergency Department (HOSPITAL_COMMUNITY)
Admission: EM | Admit: 2015-05-31 | Discharge: 2015-05-31 | Disposition: A | Discharge status: Home / Self Care | Payer: No Typology Code available for payment source | Attending: Emergency Medicine | Admitting: Emergency Medicine

## 2015-05-31 DIAGNOSIS — J449 Chronic obstructive pulmonary disease, unspecified: Secondary | ICD-10-CM | POA: Diagnosis not present

## 2015-05-31 DIAGNOSIS — S81812A Laceration without foreign body, left lower leg, initial encounter: Secondary | ICD-10-CM | POA: Diagnosis not present

## 2015-05-31 DIAGNOSIS — Y998 Other external cause status: Secondary | ICD-10-CM | POA: Diagnosis not present

## 2015-05-31 DIAGNOSIS — Z79899 Other long term (current) drug therapy: Secondary | ICD-10-CM | POA: Diagnosis not present

## 2015-05-31 DIAGNOSIS — Y9389 Activity, other specified: Secondary | ICD-10-CM | POA: Insufficient documentation

## 2015-05-31 DIAGNOSIS — S20412A Abrasion of left back wall of thorax, initial encounter: Secondary | ICD-10-CM | POA: Insufficient documentation

## 2015-05-31 DIAGNOSIS — Z8659 Personal history of other mental and behavioral disorders: Secondary | ICD-10-CM | POA: Diagnosis not present

## 2015-05-31 DIAGNOSIS — Z23 Encounter for immunization: Secondary | ICD-10-CM | POA: Insufficient documentation

## 2015-05-31 DIAGNOSIS — D649 Anemia, unspecified: Secondary | ICD-10-CM | POA: Diagnosis not present

## 2015-05-31 DIAGNOSIS — F17200 Nicotine dependence, unspecified, uncomplicated: Secondary | ICD-10-CM | POA: Diagnosis not present

## 2015-05-31 DIAGNOSIS — Y92410 Unspecified street and highway as the place of occurrence of the external cause: Secondary | ICD-10-CM | POA: Insufficient documentation

## 2015-05-31 HISTORY — DX: Chronic obstructive pulmonary disease, unspecified: J44.9

## 2015-05-31 LAB — COMPREHENSIVE METABOLIC PANEL
ALBUMIN: 3.6 g/dL (ref 3.5–5.0)
ALK PHOS: 58 U/L (ref 38–126)
ALT: 16 U/L (ref 14–54)
AST: 38 U/L (ref 15–41)
Anion gap: 16 — ABNORMAL HIGH (ref 5–15)
BILIRUBIN TOTAL: 0.4 mg/dL (ref 0.3–1.2)
BUN: 6 mg/dL (ref 6–20)
CALCIUM: 9.3 mg/dL (ref 8.9–10.3)
CO2: 21 mmol/L — AB (ref 22–32)
CREATININE: 0.64 mg/dL (ref 0.44–1.00)
Chloride: 99 mmol/L — ABNORMAL LOW (ref 101–111)
GFR calc Af Amer: >60 mL/min (ref >60)
GFR calc non Af Amer: >60 mL/min (ref >60)
GLUCOSE: 100 mg/dL — AB (ref 65–99)
Potassium: 3.9 mmol/L (ref 3.5–5.1)
SODIUM: 136 mmol/L (ref 135–145)
Total Protein: 7.5 g/dL (ref 6.5–8.1)

## 2015-05-31 LAB — CBC WITH DIFFERENTIAL/PLATELET
BASOS PCT: 0 %
Basophils Absolute: 0 10*3/uL (ref 0.0–0.1)
Eosinophils Absolute: 0.2 10*3/uL (ref 0.0–0.7)
Eosinophils Relative: 3 %
HEMATOCRIT: 26.1 % — AB (ref 36.0–46.0)
HEMOGLOBIN: 8.4 g/dL — AB (ref 12.0–15.0)
LYMPHS ABS: 2.4 10*3/uL (ref 0.7–4.0)
LYMPHS PCT: 30 %
MCH: 26.9 pg (ref 26.0–34.0)
MCHC: 32.2 g/dL (ref 30.0–36.0)
MCV: 83.7 fL (ref 78.0–100.0)
MONOS PCT: 13 %
Monocytes Absolute: 1 10*3/uL (ref 0.1–1.0)
NEUTROS ABS: 4.2 10*3/uL (ref 1.7–7.7)
NEUTROS PCT: 54 %
Platelets: 372 10*3/uL (ref 150–400)
RBC: 3.12 MIL/uL — ABNORMAL LOW (ref 3.87–5.11)
RDW: 20.9 % — ABNORMAL HIGH (ref 11.5–15.5)
WBC: 7.8 10*3/uL (ref 4.0–10.5)

## 2015-05-31 LAB — SAMPLE TO BLOOD BANK

## 2015-05-31 LAB — PROTIME-INR
INR: 0.94 (ref 0.00–1.49)
Prothrombin Time: 12.8 seconds (ref 11.6–15.2)

## 2015-05-31 LAB — CDS SEROLOGY

## 2015-05-31 LAB — ETHANOL: Alcohol, Ethyl (B): 96 mg/dL — ABNORMAL HIGH (ref <5)

## 2015-05-31 MED ORDER — IOHEXOL 300 MG/ML  SOLN
100.0000 mL | Freq: Once | INTRAMUSCULAR | Status: AC | PRN
  Administered 2015-05-31: 100 mL via INTRAVENOUS

## 2015-05-31 MED ORDER — TETANUS-DIPHTH-ACELL PERTUSSIS 5-2.5-18.5 LF-MCG/0.5 IM SUSP
0.5000 mL | Freq: Once | INTRAMUSCULAR | Status: AC
  Administered 2015-05-31: 0.5 mL via INTRAMUSCULAR
  Filled 2015-05-31: qty 0.5

## 2015-05-31 MED ORDER — LIDOCAINE-EPINEPHRINE 1 %-1:100000 IJ SOLN
30.0000 mL | Freq: Once | INTRAMUSCULAR | Status: AC
  Administered 2015-05-31: 30 mL via INTRADERMAL

## 2015-05-31 MED ORDER — LIDOCAINE-EPINEPHRINE 1 %-1:100000 IJ SOLN
10.0000 mL | Freq: Once | INTRAMUSCULAR | Status: DC
  Filled 2015-05-31 (×2): qty 1

## 2015-05-31 MED ORDER — MORPHINE SULFATE (PF) 4 MG/ML IV SOLN
4.0000 mg | Freq: Once | INTRAVENOUS | Status: AC
  Administered 2015-05-31: 4 mg via INTRAVENOUS
  Filled 2015-05-31: qty 1
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
## 2015-05-31 NOTE — ED Notes (Signed)
Bandages applied to L leg and L posterior side

## 2015-05-31 NOTE — ED Notes (Signed)
CT called and made aware pt was ready for transport

## 2015-05-31 NOTE — Discharge Instructions (Signed)
Laceration Care, Adult A laceration is a cut that goes through all layers of the skin. The cut also goes into the tissue that is right under the skin. Some cuts heal on their own. Others need to be closed with stitches (sutures), staples, skin adhesive strips, or wound glue. Taking care of your cut lowers your risk of infection and helps your cut to heal better. HOW TO TAKE CARE OF YOUR CUT For stitches or staples:  Keep the wound clean and dry.  If you were given a bandage (dressing), you should change it at least one time per day or as told by your doctor. You should also change it if it gets wet or dirty.  Keep the wound completely dry for the first 24 hours or as told by your doctor. After that time, you may take a shower or a bath. However, make sure that the wound is not soaked in water until after the stitches or staples have been removed.  Clean the wound one time each day or as told by your doctor:  Wash the wound with soap and water.  Rinse the wound with water until all of the soap comes off.  Pat the wound dry with a clean towel. Do not rub the wound.  After you clean the wound, put a thin layer of antibiotic ointment on it as told by your doctor. This ointment:  Helps to prevent infection.  Keeps the bandage from sticking to the wound.  Have your stitches or staples removed as told by your doctor. If your doctor used skin adhesive strips:   Keep the wound clean and dry.  If you were given a bandage, you should change it at least one time per day or as told by your doctor. You should also change it if it gets dirty or wet.  Do not get the skin adhesive strips wet. You can take a shower or a bath, but be careful to keep the wound dry.  If the wound gets wet, pat it dry with a clean towel. Do not rub the wound.  Skin adhesive strips fall off on their own. You can trim the strips as the wound heals. Do not remove any strips that are still stuck to the wound. They will  fall off after a while. If your doctor used wound glue:  Try to keep your wound dry, but you may briefly wet it in the shower or bath. Do not soak the wound in water, such as by swimming.  After you take a shower or a bath, gently pat the wound dry with a clean towel. Do not rub the wound.  Do not do any activities that will make you really sweaty until the skin glue has fallen off on its own.  Do not apply liquid, cream, or ointment medicine to your wound while the skin glue is still on.  If you were given a bandage, you should change it at least one time per day or as told by your doctor. You should also change it if it gets dirty or wet.  If a bandage is placed over the wound, do not let the tape for the bandage touch the skin glue.  Do not pick at the glue. The skin glue usually stays on for 5-10 days. Then, it falls off of the skin. General Instructions  To help prevent scarring, make sure to cover your wound with sunscreen whenever you are outside after stitches are removed, after adhesive strips are removed,  or when wound glue stays in place and the wound is healed. Make sure to wear a sunscreen of at least 30 SPF.  Take over-the-counter and prescription medicines only as told by your doctor.  If you were given antibiotic medicine or ointment, take or apply it as told by your doctor. Do not stop using the antibiotic even if your wound is getting better.  Do not scratch or pick at the wound.  Keep all follow-up visits as told by your doctor. This is important.  Check your wound every day for signs of infection. Watch for:  Redness, swelling, or pain.  Fluid, blood, or pus.  Raise (elevate) the injured area above the level of your heart while you are sitting or lying down, if possible. GET HELP IF:  You got a tetanus shot and you have any of these problems at the injection site:  Swelling.  Very bad pain.  Redness.  Bleeding.  You have a fever.  A wound that was  closed breaks open.  You notice a bad smell coming from your wound or your bandage.  You notice something coming out of the wound, such as wood or glass.  Medicine does not help your pain.  You have more redness, swelling, or pain at the site of your wound.  You have fluid, blood, or pus coming from your wound.  You notice a change in the color of your skin near your wound.  You need to change the bandage often because fluid, blood, or pus is coming from the wound.  You start to have a new rash.  You start to have numbness around the wound. GET HELP RIGHT AWAY IF:  You have very bad swelling around the wound.  Your pain suddenly gets worse and is very bad.  You notice painful lumps near the wound or on skin that is anywhere on your body.  You have a red streak going away from your wound.  The wound is on your hand or foot and you cannot move a finger or toe like you usually can.  The wound is on your hand or foot and you notice that your fingers or toes look pale or bluish.   This information is not intended to replace advice given to you by your health care provider. Make sure you discuss any questions you have with your health care provider.   Document Released: 09/28/2007 Document Revised: 08/26/2014 Document Reviewed: 04/07/2014 Elsevier Interactive Patient Education 2016 [employer] Anemia, Nonspecific Anemia is a condition in which the concentration of red blood cells or hemoglobin in the blood is below normal. Hemoglobin is a substance in red blood cells that carries oxygen to the tissues of the body. Anemia results in not enough oxygen reaching these tissues.  CAUSES  Common causes of anemia include:   Excessive bleeding. Bleeding may be internal or external. This includes excessive bleeding from periods (in women) or from the intestine.   Poor nutrition.   Chronic kidney, thyroid, and liver disease.  Bone marrow disorders that decrease red blood cell  production.  Cancer and treatments for cancer.  HIV, AIDS, and their treatments.  Spleen problems that increase red blood cell destruction.  Blood disorders.  Excess destruction of red blood cells due to infection, medicines, and autoimmune disorders. SIGNS AND SYMPTOMS   Minor weakness.   Dizziness.   Headache.  Palpitations.   Shortness of breath, especially with exercise.   Paleness.  Cold sensitivity.  Indigestion.  Nausea.  Difficulty  sleeping.  Difficulty concentrating. Symptoms may occur suddenly or they may develop slowly.  DIAGNOSIS  Additional blood tests are often needed. These help your health care provider determine the best treatment. Your health care provider will check your stool for blood and look for other causes of blood loss.  TREATMENT  Treatment varies depending on the cause of the anemia. Treatment can include:   Supplements of iron, vitamin 123456, or folic acid.   Hormone medicines.   A blood transfusion. This may be needed if blood loss is severe.   Hospitalization. This may be needed if there is significant continual blood loss.   Dietary changes.  Spleen removal. HOME CARE INSTRUCTIONS Keep all follow-up appointments. It often takes many weeks to correct anemia, and having your health care provider check on your condition and your response to treatment is very important. SEEK IMMEDIATE MEDICAL CARE IF:   You develop extreme weakness, shortness of breath, or chest pain.   You become dizzy or have trouble concentrating.  You develop heavy vaginal bleeding.   You develop a rash.   You have bloody or black, tarry stools.   You faint.   You vomit up blood.   You vomit repeatedly.   You have abdominal pain.  You have a fever or persistent symptoms for more than 2-3 days.   You have a fever and your symptoms suddenly get worse.   You are dehydrated.  MAKE SURE YOU:  Understand these  instructions.  Will watch your condition.  Will get help right away if you are not doing well or get worse.   This information is not intended to replace advice given to you by your health care provider. Make sure you discuss any questions you have with your health care provider.   Document Released: 05/19/2004 Document Revised: 12/12/2012 Document Reviewed: 10/05/2012 Elsevier Interactive Patient Education [employer].

## 2015-06-01 ENCOUNTER — Encounter (HOSPITAL_COMMUNITY): Payer: Self-pay | Admitting: Emergency Medicine

## 2015-06-10 ENCOUNTER — Inpatient Hospital Stay: Payer: Self-pay | Admitting: Internal Medicine

## 2015-06-24 ENCOUNTER — Encounter: Payer: Self-pay | Admitting: Internal Medicine

## 2015-06-24 ENCOUNTER — Ambulatory Visit: Payer: Medicaid Other | Attending: Internal Medicine | Admitting: Internal Medicine

## 2015-06-24 ENCOUNTER — Telehealth: Payer: Self-pay | Admitting: *Deleted

## 2015-06-24 VITALS — BP 111/71 | HR 97 | Temp 98.4°F | Resp 16 | Ht 67.0 in | Wt 122.0 lb

## 2015-06-24 DIAGNOSIS — Z23 Encounter for immunization: Secondary | ICD-10-CM | POA: Diagnosis not present

## 2015-06-24 DIAGNOSIS — S81802D Unspecified open wound, left lower leg, subsequent encounter: Secondary | ICD-10-CM | POA: Insufficient documentation

## 2015-06-24 DIAGNOSIS — M79662 Pain in left lower leg: Secondary | ICD-10-CM | POA: Diagnosis present

## 2015-06-24 DIAGNOSIS — Z0000 Encounter for general adult medical examination without abnormal findings: Secondary | ICD-10-CM

## 2015-06-24 DIAGNOSIS — J449 Chronic obstructive pulmonary disease, unspecified: Secondary | ICD-10-CM | POA: Insufficient documentation

## 2015-06-24 DIAGNOSIS — F319 Bipolar disorder, unspecified: Secondary | ICD-10-CM | POA: Insufficient documentation

## 2015-06-24 DIAGNOSIS — Z79899 Other long term (current) drug therapy: Secondary | ICD-10-CM | POA: Insufficient documentation

## 2015-06-24 DIAGNOSIS — F17210 Nicotine dependence, cigarettes, uncomplicated: Secondary | ICD-10-CM | POA: Diagnosis not present
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
## 2015-06-24 NOTE — Telephone Encounter (Signed)
Patient would like to be prescribed medications for pain. Please follow up.

## 2015-06-25 ENCOUNTER — Encounter: Payer: Self-pay | Admitting: Clinical

## 2015-06-25 NOTE — Progress Notes (Signed)
Depression screen PHQ 2/9 06/24/2015  Decreased Interest 2  Down, Depressed, Hopeless 3  PHQ - 2 Score 5  Altered sleeping 2  Tired, decreased energy 3  Change in appetite 3  Feeling bad or failure about yourself  1  Trouble concentrating 1  Moving slowly or fidgety/restless 2  Suicidal thoughts 0  PHQ-9 Score 17    GAD 7 : Generalized Anxiety Score 06/24/2015  Nervous, Anxious, on Edge 2  Control/stop worrying 2  Worry too much - different things 3  Trouble relaxing 3  Restless 3  Easily annoyed or irritable 2  Afraid - awful might happen 1  Total GAD 7 Score 16

## 2015-06-26 NOTE — Telephone Encounter (Signed)
CMA called patient number [phone]) on file. The voice response system stated, "either the number changed, or is not in service." I couldn't leave a message for the patient to call me back.

## 2015-07-10 ENCOUNTER — Encounter: Payer: Self-pay | Admitting: Family Medicine

## 2015-07-10 ENCOUNTER — Encounter: Payer: Self-pay | Admitting: Clinical

## 2015-07-10 ENCOUNTER — Ambulatory Visit: Payer: Medicaid Other | Attending: Family Medicine | Admitting: Family Medicine

## 2015-07-10 VITALS — BP 140/70 | HR 100 | Temp 98.9°F | Resp 15 | Ht 67.0 in | Wt 105.4 lb

## 2015-07-10 DIAGNOSIS — R636 Underweight: Secondary | ICD-10-CM | POA: Diagnosis not present

## 2015-07-10 DIAGNOSIS — F17210 Nicotine dependence, cigarettes, uncomplicated: Secondary | ICD-10-CM | POA: Insufficient documentation

## 2015-07-10 DIAGNOSIS — Z0000 Encounter for general adult medical examination without abnormal findings: Secondary | ICD-10-CM

## 2015-07-10 DIAGNOSIS — F1020 Alcohol dependence, uncomplicated: Secondary | ICD-10-CM | POA: Diagnosis not present

## 2015-07-10 DIAGNOSIS — Z1231 Encounter for screening mammogram for malignant neoplasm of breast: Secondary | ICD-10-CM

## 2015-07-10 DIAGNOSIS — S81802D Unspecified open wound, left lower leg, subsequent encounter: Secondary | ICD-10-CM | POA: Diagnosis not present

## 2015-07-10 DIAGNOSIS — F1010 Alcohol abuse, uncomplicated: Secondary | ICD-10-CM | POA: Insufficient documentation

## 2015-07-10 DIAGNOSIS — F17200 Nicotine dependence, unspecified, uncomplicated: Secondary | ICD-10-CM | POA: Insufficient documentation

## 2015-07-10 DIAGNOSIS — S81802S Unspecified open wound, left lower leg, sequela: Secondary | ICD-10-CM

## 2015-07-10 DIAGNOSIS — D649 Anemia, unspecified: Secondary | ICD-10-CM

## 2015-07-10 DIAGNOSIS — Z79899 Other long term (current) drug therapy: Secondary | ICD-10-CM | POA: Diagnosis not present

## 2015-07-10 DIAGNOSIS — S81802A Unspecified open wound, left lower leg, initial encounter: Secondary | ICD-10-CM | POA: Insufficient documentation

## 2015-07-10 LAB — COMPLETE METABOLIC PANEL WITH GFR
ALT: 14 U/L (ref 6–29)
AST: 33 U/L (ref 10–35)
Albumin: 3.9 g/dL (ref 3.6–5.1)
Alkaline Phosphatase: 62 U/L (ref 33–130)
BUN: 6 mg/dL — AB (ref 7–25)
CO2: 24 mmol/L (ref 20–31)
CREATININE: 0.62 mg/dL (ref 0.50–1.05)
Calcium: 9.2 mg/dL (ref 8.6–10.4)
Chloride: 100 mmol/L (ref 98–110)
GFR, Est African American: >89 mL/min (ref >=60)
GFR, Est Non African American: >89 mL/min (ref >=60)
Glucose, Bld: 102 mg/dL — ABNORMAL HIGH (ref 65–99)
POTASSIUM: 4.9 mmol/L (ref 3.5–5.3)
Sodium: 134 mmol/L — ABNORMAL LOW (ref 135–146)
Total Bilirubin: 0.3 mg/dL (ref 0.2–1.2)
Total Protein: 7.6 g/dL (ref 6.1–8.1)

## 2015-07-10 LAB — CBC
HEMATOCRIT: 26.3 % — AB (ref 36.0–46.0)
Hemoglobin: 8.2 g/dL — ABNORMAL LOW (ref 12.0–15.0)
MCH: 25.1 pg — ABNORMAL LOW (ref 26.0–34.0)
MCHC: 31.2 g/dL (ref 30.0–36.0)
MCV: 80.4 fL (ref 78.0–100.0)
MPV: 9.1 fL (ref 8.6–12.4)
Platelets: 462 10*3/uL — ABNORMAL HIGH (ref 150–400)
RBC: 3.27 MIL/uL — ABNORMAL LOW (ref 3.87–5.11)
RDW: 18.3 % — AB (ref 11.5–15.5)
WBC: 7.4 10*3/uL (ref 4.0–10.5)

## 2015-07-10 MED ORDER — SULFAMETHOXAZOLE-TRIMETHOPRIM 800-160 MG PO TABS: 1.0000 | ORAL_TABLET | Freq: Two times a day (BID) | ORAL | Status: DC

## 2015-07-10 NOTE — Progress Notes (Signed)
Depression screen [facility] 2/9 07/10/2015 07/10/2015 06/24/2015  Decreased Interest 0 0 2  Down, Depressed, Hopeless 0 0 3  PHQ - 2 Score 0 0 5  Altered sleeping 3 0 2  Tired, decreased energy 0 0 3  Change in appetite 0 0 3  Feeling bad or failure about yourself  0 0 1  Trouble concentrating 0 0 1  Moving slowly or fidgety/restless 0 0 2  Suicidal thoughts - 0 0  PHQ-9 Score 3 0 17    GAD 7 : Generalized Anxiety Score 07/10/2015 07/10/2015 06/24/2015  Nervous, Anxious, on Edge 1 0 2  Control/stop worrying 0 - 2  Worry too much - different things 1 0 3  Trouble relaxing 0 0 3  Restless 0 0 3  Easily annoyed or irritable 0 0 2  Afraid - awful might happen 0 0 1  Total GAD 7 Score 2 - 16
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
## 2015-07-10 NOTE — Assessment & Plan Note (Addendum)
A: non-healing leg wound in alcoholic, smoker. With some surrounding cellulitis  P: Bactrim Wound care consult for additional debridement of wound  Labs per orders Advised smoking cessation and ETOH decrease

## 2015-07-10 NOTE — Patient Instructions (Addendum)
Debbie Grimes was seen today for leg wound.  Diagnoses and all orders for this visit:  Leg wound, left, sequela -     AMB referral to wound care center -     sulfamethoxazole-trimethoprim (BACTRIM DS,SEPTRA DS) 800-160 MG tablet; Take 1 tablet by mouth 2 (two) times daily.  Healthcare maintenance -     HgB A1c -     Ambulatory referral to Gastroenterology  Alcoholic ([place]) -     CBC -     COMPLETE METABOLIC PANEL WITH GFR  Heavy cigarette smoker (20-39 per day)  Underweight  Visit for screening mammogram -     MM DIGITAL SCREENING BILATERAL; Future   Smoking cessation support: smoking cessation hotline: 1-800-QUIT-NOW.  Smoking cessation classes are available through [facility] and [facility]. Call [phone] or visit our website at [email or web address]  I have ordered your screening mammogram when you give use the go ahead, it will be scheduled  You will be called about wound care appointment for left leg wound   F/u in 4 weeks for pap smear   Dr. Adrian Blackwater

## 2015-07-12 DIAGNOSIS — D649 Anemia, unspecified: Secondary | ICD-10-CM | POA: Insufficient documentation

## 2015-07-12 MED ORDER — FOLIC ACID 1 MG PO TABS: 1.0000 mg | ORAL_TABLET | Freq: Every day | ORAL | Status: DC

## 2015-07-12 MED ORDER — VITAMIN B-1 100 MG PO TABS: 100.0000 mg | ORAL_TABLET | Freq: Every day | ORAL | Status: DC

## 2015-07-12 NOTE — Addendum Note (Signed)
Addended by: Boykin Nearing on: 07/12/2015 10:36 AM   Modules accepted: Orders

## 2015-07-14 ENCOUNTER — Telehealth: Payer: Self-pay | Admitting: *Deleted

## 2015-07-14 NOTE — Telephone Encounter (Signed)
-----   Message from Boykin Nearing, MD sent at 07/12/2015 10:34 AM EDT ----- Normal CMP Normocytic anemia  Thiamine and folic ordered to help with anemia

## 2015-07-14 NOTE — Telephone Encounter (Signed)
Date of birth verified by pt  Lab results given to pt  Notified Rx was send to [facility] Pt verbalized understanding

## 2015-07-27 ENCOUNTER — Encounter (HOSPITAL_BASED_OUTPATIENT_CLINIC_OR_DEPARTMENT_OTHER): Payer: Medicaid Other

## 2015-09-24 DIAGNOSIS — R768 Other specified abnormal immunological findings in serum: Secondary | ICD-10-CM

## 2015-09-24 DIAGNOSIS — K760 Fatty (change of) liver, not elsewhere classified: Secondary | ICD-10-CM

## 2015-09-24 HISTORY — DX: Other specified abnormal immunological findings in serum: R76.8

## 2015-09-24 HISTORY — DX: Fatty (change of) liver, not elsewhere classified: K76.0

## 2015-09-28 ENCOUNTER — Inpatient Hospital Stay (HOSPITAL_COMMUNITY): Payer: Medicaid Other

## 2015-09-28 ENCOUNTER — Encounter (HOSPITAL_COMMUNITY): Payer: Self-pay | Admitting: Nurse Practitioner

## 2015-09-28 ENCOUNTER — Inpatient Hospital Stay (HOSPITAL_COMMUNITY)
Admission: EM | Admit: 2015-09-28 | Discharge: 2015-10-02 | DRG: 811 | Disposition: A | Discharge status: Home / Self Care | Payer: Medicaid Other | Attending: Oncology | Admitting: Oncology

## 2015-09-28 DIAGNOSIS — D649 Anemia, unspecified: Secondary | ICD-10-CM

## 2015-09-28 DIAGNOSIS — K700 Alcoholic fatty liver: Secondary | ICD-10-CM | POA: Diagnosis present

## 2015-09-28 DIAGNOSIS — F10239 Alcohol dependence with withdrawal, unspecified: Secondary | ICD-10-CM | POA: Diagnosis present

## 2015-09-28 DIAGNOSIS — F10251 Alcohol dependence with alcohol-induced psychotic disorder with hallucinations: Secondary | ICD-10-CM | POA: Diagnosis not present

## 2015-09-28 DIAGNOSIS — Z681 Body mass index (BMI) 19 or less, adult: Secondary | ICD-10-CM | POA: Diagnosis not present

## 2015-09-28 DIAGNOSIS — Z9181 History of falling: Secondary | ICD-10-CM

## 2015-09-28 DIAGNOSIS — R42 Dizziness and giddiness: Secondary | ICD-10-CM | POA: Diagnosis present

## 2015-09-28 DIAGNOSIS — I959 Hypotension, unspecified: Secondary | ICD-10-CM | POA: Diagnosis present

## 2015-09-28 DIAGNOSIS — M25561 Pain in right knee: Secondary | ICD-10-CM | POA: Diagnosis not present

## 2015-09-28 DIAGNOSIS — K7010 Alcoholic hepatitis without ascites: Secondary | ICD-10-CM | POA: Diagnosis present

## 2015-09-28 DIAGNOSIS — E43 Unspecified severe protein-calorie malnutrition: Secondary | ICD-10-CM | POA: Diagnosis present

## 2015-09-28 DIAGNOSIS — R2681 Unsteadiness on feet: Secondary | ICD-10-CM | POA: Diagnosis present

## 2015-09-28 DIAGNOSIS — E876 Hypokalemia: Secondary | ICD-10-CM | POA: Diagnosis present

## 2015-09-28 DIAGNOSIS — Z79899 Other long term (current) drug therapy: Secondary | ICD-10-CM | POA: Diagnosis not present

## 2015-09-28 DIAGNOSIS — E871 Hypo-osmolality and hyponatremia: Secondary | ICD-10-CM | POA: Diagnosis present

## 2015-09-28 DIAGNOSIS — F17210 Nicotine dependence, cigarettes, uncomplicated: Secondary | ICD-10-CM | POA: Diagnosis present

## 2015-09-28 DIAGNOSIS — F319 Bipolar disorder, unspecified: Secondary | ICD-10-CM | POA: Diagnosis present

## 2015-09-28 DIAGNOSIS — B182 Chronic viral hepatitis C: Secondary | ICD-10-CM | POA: Diagnosis present

## 2015-09-28 DIAGNOSIS — R531 Weakness: Secondary | ICD-10-CM | POA: Diagnosis not present

## 2015-09-28 DIAGNOSIS — Z0184 Encounter for antibody response examination: Secondary | ICD-10-CM

## 2015-09-28 DIAGNOSIS — Z789 Other specified health status: Secondary | ICD-10-CM

## 2015-09-28 DIAGNOSIS — R768 Other specified abnormal immunological findings in serum: Secondary | ICD-10-CM

## 2015-09-28 DIAGNOSIS — K709 Alcoholic liver disease, unspecified: Secondary | ICD-10-CM | POA: Diagnosis not present

## 2015-09-28 DIAGNOSIS — F1020 Alcohol dependence, uncomplicated: Secondary | ICD-10-CM | POA: Diagnosis present

## 2015-09-28 DIAGNOSIS — F10939 Alcohol use, unspecified with withdrawal, unspecified: Secondary | ICD-10-CM | POA: Diagnosis present

## 2015-09-28 DIAGNOSIS — F1010 Alcohol abuse, uncomplicated: Secondary | ICD-10-CM

## 2015-09-28 DIAGNOSIS — Z825 Family history of asthma and other chronic lower respiratory diseases: Secondary | ICD-10-CM

## 2015-09-28 DIAGNOSIS — R64 Cachexia: Secondary | ICD-10-CM | POA: Diagnosis present

## 2015-09-28 DIAGNOSIS — J449 Chronic obstructive pulmonary disease, unspecified: Secondary | ICD-10-CM | POA: Diagnosis present

## 2015-09-28 DIAGNOSIS — B1920 Unspecified viral hepatitis C without hepatic coma: Secondary | ICD-10-CM

## 2015-09-28 DIAGNOSIS — D509 Iron deficiency anemia, unspecified: Secondary | ICD-10-CM | POA: Diagnosis present

## 2015-09-28 DIAGNOSIS — F419 Anxiety disorder, unspecified: Secondary | ICD-10-CM | POA: Diagnosis present

## 2015-09-28 DIAGNOSIS — K760 Fatty (change of) liver, not elsewhere classified: Secondary | ICD-10-CM | POA: Diagnosis present

## 2015-09-28 HISTORY — DX: Other specified abnormal immunological findings in serum: R76.8

## 2015-09-28 HISTORY — DX: Alcohol dependence, uncomplicated: F10.20

## 2015-09-28 HISTORY — DX: Other psychoactive substance abuse, uncomplicated: F19.10

## 2015-09-28 HISTORY — DX: Fatty (change of) liver, not elsewhere classified: K76.0

## 2015-09-28 HISTORY — DX: Unspecified protein-calorie malnutrition: E46

## 2015-09-28 LAB — COMPREHENSIVE METABOLIC PANEL
ALBUMIN: 3.7 g/dL (ref 3.5–5.0)
ALK PHOS: 65 U/L (ref 38–126)
ALT: 35 U/L (ref 14–54)
AST: 83 U/L — AB (ref 15–41)
Anion gap: 12 (ref 5–15)
BUN: 7 mg/dL (ref 6–20)
CALCIUM: 9.4 mg/dL (ref 8.9–10.3)
CO2: 23 mmol/L (ref 22–32)
CREATININE: 0.83 mg/dL (ref 0.44–1.00)
Chloride: 96 mmol/L — ABNORMAL LOW (ref 101–111)
GFR calc Af Amer: >60 mL/min (ref >60)
GFR calc non Af Amer: >60 mL/min (ref >60)
Glucose, Bld: 85 mg/dL (ref 65–99)
POTASSIUM: 3.8 mmol/L (ref 3.5–5.1)
SODIUM: 131 mmol/L — AB (ref 135–145)
TOTAL PROTEIN: 7.9 g/dL (ref 6.5–8.1)
Total Bilirubin: 1.1 mg/dL (ref 0.3–1.2)

## 2015-09-28 LAB — CBC WITH DIFFERENTIAL/PLATELET
BASOS ABS: 0 10*3/uL (ref 0.0–0.1)
Basophils Relative: 0 %
EOS PCT: 0 %
Eosinophils Absolute: 0 10*3/uL (ref 0.0–0.7)
HCT: 25.2 % — ABNORMAL LOW (ref 36.0–46.0)
Hemoglobin: 7.6 g/dL — ABNORMAL LOW (ref 12.0–15.0)
LYMPHS ABS: 1.3 10*3/uL (ref 0.7–4.0)
Lymphocytes Relative: 12 %
MCH: 23.7 pg — AB (ref 26.0–34.0)
MCHC: 30.2 g/dL (ref 30.0–36.0)
MCV: 78.5 fL (ref 78.0–100.0)
MONO ABS: 1.3 10*3/uL — AB (ref 0.1–1.0)
Monocytes Relative: 13 %
Neutro Abs: 7.7 10*3/uL (ref 1.7–7.7)
Neutrophils Relative %: 75 %
Platelets: 209 10*3/uL (ref 150–400)
RBC: 3.21 MIL/uL — AB (ref 3.87–5.11)
RDW: 20.7 % — AB (ref 11.5–15.5)
WBC: 10.3 10*3/uL (ref 4.0–10.5)

## 2015-09-28 LAB — MAGNESIUM: Magnesium: 2 mg/dL (ref 1.7–2.4)

## 2015-09-28 LAB — FERRITIN: Ferritin: 10 ng/mL — ABNORMAL LOW (ref 11–307)

## 2015-09-28 LAB — PROTIME-INR
INR: 1.05 (ref 0.00–1.49)
Prothrombin Time: 13.9 seconds (ref 11.6–15.2)

## 2015-09-28 LAB — APTT: aPTT: 26 seconds (ref 24–37)

## 2015-09-28 LAB — ETHANOL: Alcohol, Ethyl (B): <5 mg/dL (ref <5)

## 2015-09-28 MED ORDER — MENTHOL 3 MG MT LOZG: 1.0000 | LOZENGE | OROMUCOSAL | Status: DC | PRN

## 2015-09-28 MED ORDER — FOLIC ACID 1 MG PO TABS
1.0000 mg | ORAL_TABLET | Freq: Every day | ORAL | Status: DC
  Administered 2015-09-28 – 2015-10-02 (×5): 1 mg via ORAL
  Filled 2015-09-28 (×5): qty 1

## 2015-09-28 MED ORDER — LORAZEPAM 2 MG/ML IJ SOLN: 1.0000 mg | Freq: Four times a day (QID) | INTRAMUSCULAR | Status: AC | PRN

## 2015-09-28 MED ORDER — ACETAMINOPHEN 325 MG PO TABS
650.0000 mg | ORAL_TABLET | Freq: Four times a day (QID) | ORAL | Status: DC | PRN
  Administered 2015-09-28 – 2015-10-01 (×3): 650 mg via ORAL
  Filled 2015-09-28 (×3): qty 2

## 2015-09-28 MED ORDER — ADULT MULTIVITAMIN W/MINERALS CH
1.0000 | ORAL_TABLET | Freq: Every day | ORAL | Status: DC
  Administered 2015-09-28 – 2015-10-02 (×5): 1 via ORAL
  Filled 2015-09-28 (×5): qty 1

## 2015-09-28 MED ORDER — THIAMINE HCL 100 MG/ML IJ SOLN
100.0000 mg | Freq: Every day | INTRAMUSCULAR | Status: DC
  Filled 2015-09-28: qty 2

## 2015-09-28 MED ORDER — SODIUM CHLORIDE 0.9 % IV BOLUS (SEPSIS)
1000.0000 mL | Freq: Once | INTRAVENOUS | Status: AC
  Administered 2015-09-28: 1000 mL via INTRAVENOUS

## 2015-09-28 MED ORDER — LORAZEPAM 1 MG PO TABS
0.0000 mg | ORAL_TABLET | Freq: Two times a day (BID) | ORAL | Status: DC
  Administered 2015-09-30: 1 mg via ORAL
  Filled 2015-09-28: qty 1

## 2015-09-28 MED ORDER — ONDANSETRON HCL 4 MG/2ML IJ SOLN: 4.0000 mg | Freq: Four times a day (QID) | INTRAMUSCULAR | Status: DC | PRN

## 2015-09-28 MED ORDER — NICOTINE 21 MG/24HR TD PT24
21.0000 mg | MEDICATED_PATCH | Freq: Every day | TRANSDERMAL | Status: DC
  Administered 2015-09-28 – 2015-10-02 (×5): 21 mg via TRANSDERMAL
  Filled 2015-09-28 (×5): qty 1

## 2015-09-28 MED ORDER — ENOXAPARIN SODIUM 30 MG/0.3ML ~~LOC~~ SOLN
30.0000 mg | SUBCUTANEOUS | Status: DC
  Administered 2015-09-28: 30 mg via SUBCUTANEOUS
  Filled 2015-09-28: qty 0.3

## 2015-09-28 MED ORDER — VITAMIN B-1 100 MG PO TABS
100.0000 mg | ORAL_TABLET | Freq: Every day | ORAL | Status: DC
  Administered 2015-09-28 – 2015-10-02 (×5): 100 mg via ORAL
  Filled 2015-09-28 (×5): qty 1

## 2015-09-28 MED ORDER — LORAZEPAM 1 MG PO TABS
0.0000 mg | ORAL_TABLET | Freq: Four times a day (QID) | ORAL | Status: DC
  Administered 2015-09-28: 1 mg via ORAL
  Filled 2015-09-28: qty 1

## 2015-09-28 MED ORDER — ACETAMINOPHEN 650 MG RE SUPP: 650.0000 mg | Freq: Four times a day (QID) | RECTAL | Status: DC | PRN

## 2015-09-28 MED ORDER — LORAZEPAM 1 MG PO TABS
0.0000 mg | ORAL_TABLET | Freq: Four times a day (QID) | ORAL | Status: AC
  Administered 2015-09-29 (×2): 1 mg via ORAL
  Administered 2015-09-29: 2 mg via ORAL
  Administered 2015-09-30: 1 mg via ORAL
  Filled 2015-09-28: qty 1
  Filled 2015-09-28: qty 2
  Filled 2015-09-28 (×2): qty 1

## 2015-09-28 MED ORDER — ONDANSETRON HCL 4 MG PO TABS: 4.0000 mg | ORAL_TABLET | Freq: Four times a day (QID) | ORAL | Status: DC | PRN

## 2015-09-28 MED ORDER — LORAZEPAM 1 MG PO TABS
1.0000 mg | ORAL_TABLET | Freq: Four times a day (QID) | ORAL | Status: AC | PRN
  Administered 2015-10-01: 1 mg via ORAL
  Filled 2015-09-28: qty 1

## 2015-09-28 MED ORDER — LORAZEPAM 1 MG PO TABS: 0.0000 mg | ORAL_TABLET | Freq: Two times a day (BID) | ORAL | Status: DC

## 2015-09-28 MED ORDER — THIAMINE HCL 100 MG/ML IJ SOLN
100.0000 mg | Freq: Once | INTRAMUSCULAR | Status: AC
  Administered 2015-09-28: 100 mg via INTRAVENOUS
  Filled 2015-09-28: qty 2

## 2015-09-28 NOTE — Progress Notes (Signed)
New Admission Note: transfer from ED  Arrival Method: stretcher Mental Orientation: a/o x4 Telemetry: none Assessment: Completed Skin: clean dry intact IV: LFA SL Pain: R knee Tubes: none Safety Measures: Safety Fall Prevention Plan has been given, discussed and signed Admission: Completed Unit Orientation: Patient has been orientated to the room, unit and staff.  Family: present upon arrival  Orders have been reviewed and implemented. Will continue to monitor the patient. Call light has been placed within reach and bed alarm has been activated.   Retta Mac BSN, RN
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
## 2015-09-28 NOTE — ED Notes (Signed)
Brought patient back to room via wheelchair; patient undressed, in gown, on continuous pulse oximetry and blood pressure cuff

## 2015-09-28 NOTE — ED Provider Notes (Signed)
CSN: [id number]     Arrival date & time 09/28/15  1118 History   First MD Initiated Contact with Patient 09/28/15 1218     Chief Complaint  Patient presents with  . Back Pain     (Consider location/radiation/quality/duration/timing/severity/associated sxs/prior Treatment) HPI   The patient fell backwards off her back porch several weeks ago and landed on grass and rocks.  After that, she went straight to bed. She noted pain in the right shoulder and diffusely in her back. She did not hit her head. No LOC. No numbness, tingling, weakness in her upper or lower extrmeties. No bladder or bowel incontinence.  She notes a constant sharp pain that originates from her R shoulder and radiates into her right pointer finger.   She's tried taking Tylenol and Advil but it didn't help Of note, she has fallen 2-3 times in the last few weeks. She has tremors on my exam and notes she has them at baseline. She denies any melena or hematochezia. She has a history of alcoholism, and notes that when her husband is not home she will intermittently drink, but she notes he will not let her drink when he is around. She last drank 2 days ago.   Her blood pressure was low in the ED, I asked her about recent illnesses. She noted that for approximately 1 week she had diarrhea and vomiting. She states the symptoms resolved 2-3 days ago, but notes that she has not eaten or drank anything today.  Past Medical History  Diagnosis Date  . Bipolar 1 disorder ([place])   . Depression   . COPD (chronic obstructive pulmonary disease) ([place])   . Anxiety    History reviewed. No pertinent past surgical history. History reviewed. No pertinent family history. Social History  Substance Use Topics  . Smoking status: Current Every Day Smoker    Types: Cigarettes  . Smokeless tobacco: None  . Alcohol Use: Yes     Comment: "2 40 oz beers per day   OB History    Gravida Para Term Preterm AB TAB SAB Ectopic Multiple Living   2 2 0 0 0  0 0 0       Review of Systems  Constitutional: Negative for fever, appetite change and fatigue.  HENT: Negative for congestion, dental problem, ear pain, facial swelling, nosebleeds, postnasal drip, sinus pressure and trouble swallowing.   Eyes: Negative for photophobia, pain and visual disturbance.  Respiratory: Negative for cough, chest tightness, shortness of breath, wheezing and stridor.   Cardiovascular: Negative.  Negative for chest pain and palpitations.  Gastrointestinal: Negative for abdominal pain, blood in stool and abdominal distention.  Endocrine: Negative.   Genitourinary: Negative.   Musculoskeletal: Positive for back pain, arthralgias and neck pain. Negative for myalgias, joint swelling and neck stiffness.  Skin: Negative for rash.  Allergic/Immunologic: Negative.   Neurological: Negative for dizziness, tremors, facial asymmetry, weakness, light-headedness, numbness and headaches.  Hematological: Negative.   Psychiatric/Behavioral: Positive for hallucinations. Negative for sleep disturbance.    Allergies  Review of patient's allergies indicates no known allergies.  Home Medications   Prior to Admission medications   Medication Sig Start Date End Date Taking? Authorizing Provider  acetaminophen (TYLENOL) 500 MG tablet Take 1,000 mg by mouth every 6 (six) hours as needed for mild pain.   Yes Historical Provider, MD  folic acid (FOLVITE) 1 MG tablet Take 1 tablet (1 mg total) by mouth daily. 07/12/15  Yes Boykin Nearing, MD  ibuprofen (ADVIL,MOTRIN) 200  MG tablet Take 400-600 mg by mouth every 6 (six) hours as needed for moderate pain. Reported on 07/10/2015   Yes Historical Provider, MD  Omega-3 Fatty Acids (FISH OIL PO) Take 1 capsule by mouth daily. Reported on 07/10/2015   Yes Historical Provider, MD   BP 117/106 mmHg  Pulse 99  Temp(Src) 98.9 F (37.2 C) (Oral)  Resp 16  Ht 5\' 5"  (1.651 m)  Wt 47.174 kg  BMI 17.31 kg/m2  SpO2 100% Physical Exam   Constitutional: She appears well-developed. No distress.  Chronically ill appearing, appears older than stated age. Patient intermittently talking to people not in the room.  HENT:  Head: Normocephalic.  Right Ear: External ear normal.  Left Ear: External ear normal.  Poor dentition  Eyes: Conjunctivae and EOM are normal. Pupils are equal, round, and reactive to light. Right eye exhibits no discharge. Left eye exhibits no discharge. No scleral icterus.  Neck: Normal range of motion. No tracheal deviation present.  Cardiovascular: Normal rate and regular rhythm.  Exam reveals no gallop and no friction rub.   No murmur heard. Pulmonary/Chest: Effort normal and breath sounds normal. No stridor. No respiratory distress. She has no wheezes. She has no rales. She exhibits no tenderness.  Abdominal: Soft. Bowel sounds are normal. She exhibits no distension. There is no tenderness. There is no rebound and no guarding.  Musculoskeletal: She exhibits no edema.  Tremors noted in the UE bilaterally. Decreased abduction and flexion in the shoulders bilaterally. No ecchymoses or swelling noted. Pain with palpation over the anterior shoulder. Negative Hawkins. Unable to perform empty can test due to ability to follow directions  Lymphadenopathy:    She has no cervical adenopathy.  Neurological: She is alert.  Skin: Skin is warm. She is not diaphoretic.  Psychiatric:  Intermittently stating there are babies, and old men wheezing in the corner of the room.    ED Course  Procedures (including critical care time) Labs Review Labs Reviewed  CBC WITH DIFFERENTIAL/PLATELET - Abnormal; Notable for the following:    RBC 3.21 (*)    Hemoglobin 7.6 (*)    HCT 25.2 (*)    MCH 23.7 (*)    RDW 20.7 (*)    Monocytes Absolute 1.3 (*)    All other components within normal limits  COMPREHENSIVE METABOLIC PANEL - Abnormal; Notable for the following:    Sodium 131 (*)    Chloride 96 (*)    AST 83 (*)    All  other components within normal limits  ETHANOL  PROTIME-INR  APTT  URINALYSIS, ROUTINE W REFLEX MICROSCOPIC (NOT AT [facility]  URINE RAPID DRUG SCREEN, HOSP PERFORMED    Imaging Review No results found. I have personally reviewed and evaluated these images and lab results as part of my medical decision-making.   EKG Interpretation None      MDM   Final diagnoses:  Alcohol withdrawal, with unspecified complication (HCC)  Symptomatic anemia   This is a 50 year old female with past medical history of alcoholism presenting with diffuse back pain and right shoulder pain. During her stay, the patient seems very confused at times, intermittently not answering questions properly. At one point during the encounter, she was hallucinating babies in all men being in the room. She's had several falls within the last few weeks. It appears as though her hemoglobin has progressively dropped over the last few months, currently 7.6 suspicious for symptomatic anemia. Her blood pressure is slightly low, 90s/60s and 70s, unsure if this  is due to her anemia vs hypovolemia given poor PO intake and recent GI illness. She received a 1 L NS bolus and remained stable.   Given her tremors, poor history, and hallucinations (I see bipolar on her problem list therefore it is difficult to know if this is new), an alcohol level was obtained that was undetectable. In the past her ethyl levels have ranged from 456-96, therefore I suspect she is withdrawing from alcohol.   Spoke with Dr. Posey Pronto with Internal medicine who will accept the patient to telemetry. Suggest CIWA protocol, f/u anemia, and f/u hypotension. UDS, U/A, and right shoulder x-ray ordered but not yet received.   Archie Patten, MD [facility] Resident  09/28/2015, 3:16 PM    Archie Patten, MD 09/28/15 1517  Leo Grosser, MD 09/29/15 [phone]

## 2015-09-28 NOTE — ED Notes (Signed)
Pt c/o 2 week history of back pain. Onset after she slipped and fell off porch onto her back. States she fell because the porch was wet. She has tried bengay at home with no relief. She is hypotensive, denies any dizziness/lightheadedness, denies any history of hypotension. She is alert and breathing easily.

## 2015-09-28 NOTE — ED Notes (Signed)
Admitting MD at bedside.

## 2015-09-29 ENCOUNTER — Encounter (HOSPITAL_COMMUNITY): Payer: Self-pay | Admitting: Physician Assistant

## 2015-09-29 DIAGNOSIS — B1920 Unspecified viral hepatitis C without hepatic coma: Secondary | ICD-10-CM

## 2015-09-29 DIAGNOSIS — E876 Hypokalemia: Secondary | ICD-10-CM

## 2015-09-29 DIAGNOSIS — E43 Unspecified severe protein-calorie malnutrition: Secondary | ICD-10-CM | POA: Diagnosis present

## 2015-09-29 LAB — IRON AND TIBC
IRON: 9 ug/dL — AB (ref 28–170)
SATURATION RATIOS: 2 % — AB (ref 10.4–31.8)
TIBC: 375 ug/dL (ref 250–450)
UIBC: 366 ug/dL

## 2015-09-29 LAB — CBC
HCT: 21.3 % — ABNORMAL LOW (ref 36.0–46.0)
HEMOGLOBIN: 6.5 g/dL — AB (ref 12.0–15.0)
MCH: 24.3 pg — AB (ref 26.0–34.0)
MCHC: 30.5 g/dL (ref 30.0–36.0)
MCV: 79.8 fL (ref 78.0–100.0)
Platelets: 176 10*3/uL (ref 150–400)
RBC: 2.67 MIL/uL — ABNORMAL LOW (ref 3.87–5.11)
RDW: 21.3 % — ABNORMAL HIGH (ref 11.5–15.5)
WBC: 6.5 10*3/uL (ref 4.0–10.5)

## 2015-09-29 LAB — COMPREHENSIVE METABOLIC PANEL
ALBUMIN: 2.9 g/dL — AB (ref 3.5–5.0)
ALK PHOS: 51 U/L (ref 38–126)
ALT: 27 U/L (ref 14–54)
ANION GAP: 10 (ref 5–15)
AST: 63 U/L — AB (ref 15–41)
BUN: 7 mg/dL (ref 6–20)
CALCIUM: 8.7 mg/dL — AB (ref 8.9–10.3)
CHLORIDE: 98 mmol/L — AB (ref 101–111)
CO2: 24 mmol/L (ref 22–32)
Creatinine, Ser: 0.77 mg/dL (ref 0.44–1.00)
GFR calc Af Amer: >60 mL/min (ref >60)
GFR calc non Af Amer: >60 mL/min (ref >60)
GLUCOSE: 95 mg/dL (ref 65–99)
Potassium: 3 mmol/L — ABNORMAL LOW (ref 3.5–5.1)
SODIUM: 132 mmol/L — AB (ref 135–145)
Total Bilirubin: 0.9 mg/dL (ref 0.3–1.2)
Total Protein: 6 g/dL — ABNORMAL LOW (ref 6.5–8.1)

## 2015-09-29 LAB — LACTATE DEHYDROGENASE: LDH: 171 U/L (ref 98–192)

## 2015-09-29 LAB — VITAMIN B12: Vitamin B-12: 265 pg/mL (ref 180–914)

## 2015-09-29 LAB — ABO/RH: ABO/RH(D): A NEG

## 2015-09-29 LAB — COMMENT2 - HEP PANEL

## 2015-09-29 LAB — RETICULOCYTES
RBC.: 2.99 MIL/uL — ABNORMAL LOW (ref 3.87–5.11)
RETIC CT PCT: 3 % (ref 0.4–3.1)
Retic Count, Absolute: 89.7 10*3/uL (ref 19.0–186.0)

## 2015-09-29 LAB — HEPATITIS C ANTIBODY (REFLEX)

## 2015-09-29 LAB — FOLATE: Folate: 18.7 ng/mL (ref >5.9)

## 2015-09-29 LAB — PREPARE RBC (CROSSMATCH)

## 2015-09-29 LAB — HIV ANTIBODY (ROUTINE TESTING W REFLEX): HIV SCREEN 4TH GENERATION: NONREACTIVE

## 2015-09-29 MED ORDER — SODIUM CHLORIDE 0.9 % IV SOLN: Freq: Once | INTRAVENOUS | Status: AC

## 2015-09-29 MED ORDER — SODIUM CHLORIDE 0.9 % IV SOLN
INTRAVENOUS | Status: AC
  Administered 2015-09-29: 11:00:00 via INTRAVENOUS

## 2015-09-29 MED ORDER — PNEUMOCOCCAL VAC POLYVALENT 25 MCG/0.5ML IJ INJ
0.5000 mL | INJECTION | INTRAMUSCULAR | Status: AC
  Administered 2015-09-29: 0.5 mL via INTRAMUSCULAR
  Filled 2015-09-29: qty 0.5

## 2015-09-29 MED ORDER — BOOST PLUS PO LIQD
237.0000 mL | Freq: Two times a day (BID) | ORAL | Status: DC
  Administered 2015-09-29 – 2015-10-02 (×4): 237 mL via ORAL
  Filled 2015-09-29 (×9): qty 237

## 2015-09-29 MED ORDER — CYANOCOBALAMIN 1000 MCG/ML IJ SOLN
1000.0000 ug | Freq: Once | INTRAMUSCULAR | Status: AC
  Administered 2015-09-30: 1000 ug via SUBCUTANEOUS
  Filled 2015-09-29: qty 1

## 2015-09-29 MED ORDER — POTASSIUM CHLORIDE 20 MEQ/15ML (10%) PO SOLN
40.0000 meq | Freq: Two times a day (BID) | ORAL | Status: AC
  Administered 2015-09-29 (×2): 40 meq via ORAL
  Filled 2015-09-29 (×2): qty 30

## 2015-09-29 MED ORDER — DICLOFENAC SODIUM 1 % TD GEL
4.0000 g | Freq: Four times a day (QID) | TRANSDERMAL | Status: DC
  Administered 2015-09-29 – 2015-10-01 (×10): 4 g via TOPICAL
  Filled 2015-09-29: qty 100

## 2015-09-29 NOTE — Evaluation (Signed)
Occupational Therapy Evaluation Patient Details Name: Debbie Grimes MRN: [id number] DOB: April 11, 1966 Today's Date: 09/29/2015    History of Present Illness [name] is a 50 year old female with PMH of COPD and Bipolar 1 disorder, heavy drinking, anxiety, depression who who presented to [facility] ED after a fall. She was at a friend's cookout last week (Wed or Thurs) when her right knee gave out on her and she fell to the ground from a standing position.    Clinical Impression   Pt admitted with above. Pt independent with ADLs, PTA. Feel pt will benefit from acute OT to increase independence and strength prior to d/c. HR up to 120s in session.    Follow Up Recommendations  No OT follow up;Supervision - Intermittent;Other (comment) (may benefit from alcohol rehab)    Equipment Recommendations   (3 in 1 bedside commode or shower seat)    Recommendations for Other Services       Precautions / Restrictions Precautions Precautions: Fall Precaution Comments: watch HR Restrictions Weight Bearing Restrictions: No      Mobility Bed Mobility               General bed mobility comments: not assessed  Transfers Overall transfer level: Needs assistance   Transfers: Sit to/from Stand Sit to Stand: Min guard              Balance        unsteady with single leg stance in session.                                    ADL Overall ADL's : Needs assistance/impaired     Grooming: Supervision/safety;Set up;Standing       Lower Body Bathing: Min guard;Sit to/from stand Lower Body Bathing Details (indicate cue type and reason): decreased balance with single leg stance     Lower Body Dressing: Min guard;Sit to/from stand   Toilet Transfer: Min guard;Ambulation (sit to stand from chair)           Functional mobility during ADLs: Min guard General ADL Comments: Educated on safety such as sitting for LB ADLs. Talked with her about using a  shower chair. Educated on energy conservation.     Vision     Perception     Praxis      Pertinent Vitals/Pain Pain Assessment: 0-10 Pain Score: 7  Pain Location: right leg and right shoulder Pain Descriptors / Indicators: Burning;Other (Comment);Aching;Sore (stiffness ) Pain Intervention(s): Monitored during session   HR up to 120s in session, but went down.     Hand Dominance     Extremity/Trunk Assessment Upper Extremity Assessment Upper Extremity Assessment: RUE deficits/detail;LUE deficits/detail RUE Deficits / Details: weakness in shoulder flexors; less than full AROM shoulder flexion when initially tested; tremors in hands LUE Deficits / Details: weakness in shoulder flexors; less than full AROM shoulder flexion when initially tested; tremors in hands   Lower Extremity Assessment Lower Extremity Assessment: Defer to PT evaluation       Communication Communication Communication: No difficulties   Cognition Arousal/Alertness: Awake/alert Behavior During Therapy: WFL for tasks assessed/performed Overall Cognitive Status: Within Functional Limits for tasks assessed                     General Comments       Exercises Exercises: Other exercises Other Exercises Other Exercises: educated on UE exercise  Shoulder Instructions      Home Living Family/patient expects to be discharged to:: Private residence Living Arrangements: Spouse/significant other Available Help at Discharge: Friend(s);Available 24 hours/day               Bathroom Shower/Tub: Occupational psychologist: Standard         Additional Comments: please refer to PT eval for other home setup information      Prior Functioning/Environment Level of Independence: Needs assistance    ADL's / Homemaking Assistance Needed: assist with laundry, cleaning, and dishes        OT Diagnosis: Generalized weakness   OT Problem List: Decreased strength;Decreased range of  motion;Decreased activity tolerance;Impaired balance (sitting and/or standing);Decreased knowledge of use of DME or AE;Pain   OT Treatment/Interventions: Self-care/ADL training;Therapeutic exercise;DME and/or AE instruction;Energy conservation;Therapeutic activities;Patient/family education;Balance training    OT Goals(Current goals can be found in the care plan section) Acute Rehab OT Goals Patient Stated Goal: not stated OT Goal Formulation: With patient Time For Goal Achievement: 10/06/15 Potential to Achieve Goals: Good ADL Goals Pt Will Perform Lower Body Dressing: with supervision;sit to/from stand (including gathering items) Pt Will Transfer to Toilet: ambulating;regular height toilet;with modified independence Pt Will Perform Tub/Shower Transfer: Tub transfer;with supervision;ambulating;with set-up (3 in 1 vs shower seat) Additional ADL Goal #1: Pt will independently perform HEP for bilateral UEs to increase strength.  OT Frequency: Min 2X/week   Barriers to D/C:            Co-evaluation              End of Session Equipment Utilized During Treatment: Gait belt  Activity Tolerance: Patient tolerated treatment well;Other (comment) (however had increased HR) Patient left: in chair;with call bell/phone within reach;with chair alarm set   Time: [id number] OT Time Calculation (min): 13 min Charges:  OT General Charges $OT Visit: 1 Procedure OT Evaluation $OT Eval Moderate Complexity: 1 Procedure G-CodesBenito Mccreedy OTR/L [id number] 09/29/2015, 1:07 PM

## 2015-09-29 NOTE — Progress Notes (Signed)
Initial Nutrition Assessment  DOCUMENTATION CODES:   Severe malnutrition in context of chronic illness, Underweight  INTERVENTION:  Provide Boost Plus po BID, each supplement provides 360 kcal and 14 grams of protein.   Encourage adequate PO intake.   NUTRITION DIAGNOSIS:   Malnutrition related to chronic illness as evidenced by severe depletion of body fat, energy intake < or equal to 75% for > or equal to 1 month.  GOAL:   Patient will meet greater than or equal to 90% of their needs  MONITOR:   PO intake, Supplement acceptance, Weight trends, Labs, I & O's  REASON FOR ASSESSMENT:   Consult  (malnutrition)  ASSESSMENT:   50 year old female with PMH of COPD (self-reported) and Bipolar 1 disorder, anxiety, depression (per EPIC) who presented to [facility] ED after a fall. Since her fall she reports having decreased appetite, vomiting most food she tries to eat.  She reports drinking two 40 oz beers a day.  Pt reports appetite is fine. Meal completion has been 50%. Pt reports she does not eat well at home and reports she only picks at food at meals. She reports this has been ongoing for more than 2 months. Pt reports usual body weight of ~104 lbs. Weight has been stable. Pt reports she has tried Ensure at home however reports disliking them. Pt is agreeable on trying Boost Plus. RD to order.   Nutrition-Focused physical exam completed. Findings are severe fat depletion, moderate muscle depletion, and no edema.   Labs and medications reviewed.   Diet Order:  Diet regular Room service appropriate?: Yes; Fluid consistency:: Thin  Skin:  Reviewed, no issues  Last BM:  6/5  Height:   Ht Readings from Last 1 Encounters:  09/28/15 5\' 5"  (1.651 m)    Weight:   Wt Readings from Last 1 Encounters:  09/28/15 106 lb 12.8 oz (48.444 kg)    Ideal Body Weight:  56.8 kg  BMI:  Body mass index is 17.77 kg/(m^2).  Estimated Nutritional Needs:   Kcal:  1600-1800  Protein:   75-85 grams  Fluid:  1.6 - 1.8 L/day  EDUCATION NEEDS:   No education needs identified at this time  Corrin Parker, MS, RD, LDN Pager # [phone] After hours/ weekend pager # [phone]

## 2015-09-29 NOTE — Progress Notes (Signed)
CRITICAL VALUE ALERT  Critical value received:  Hgb 6.5  Date of notification:  09/29/15  Time of notification:  0605  Critical value read back:Yes.    Nurse who received alert:  Elizbeth Squires RN  MD notified (1st page):  Dr. Arcelia Jew   Time of first page:  0607  Responding MD:  Dr. Arcelia Jew  Also informed Dr. Arcelia Jew of potassium 3.0  Time MD responded:  0610  Iantha Fallen RN 7:59 AM 09/29/2015
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
## 2015-09-30 ENCOUNTER — Encounter (HOSPITAL_COMMUNITY): Payer: Self-pay | Admitting: Physician Assistant

## 2015-09-30 ENCOUNTER — Inpatient Hospital Stay (HOSPITAL_COMMUNITY): Payer: Medicaid Other

## 2015-09-30 DIAGNOSIS — K760 Fatty (change of) liver, not elsewhere classified: Secondary | ICD-10-CM | POA: Diagnosis present

## 2015-09-30 DIAGNOSIS — B182 Chronic viral hepatitis C: Secondary | ICD-10-CM | POA: Insufficient documentation

## 2015-09-30 DIAGNOSIS — D509 Iron deficiency anemia, unspecified: Secondary | ICD-10-CM | POA: Insufficient documentation

## 2015-09-30 DIAGNOSIS — K709 Alcoholic liver disease, unspecified: Secondary | ICD-10-CM | POA: Insufficient documentation

## 2015-09-30 LAB — RAPID URINE DRUG SCREEN, HOSP PERFORMED
Amphetamines: POSITIVE — AB
BENZODIAZEPINES: POSITIVE — AB
Barbiturates: NOT DETECTED
COCAINE: NOT DETECTED
OPIATES: NOT DETECTED
Tetrahydrocannabinol: POSITIVE — AB

## 2015-09-30 LAB — URINALYSIS, ROUTINE W REFLEX MICROSCOPIC
Bilirubin Urine: NEGATIVE
Glucose, UA: NEGATIVE mg/dL
Hgb urine dipstick: NEGATIVE
KETONES UR: NEGATIVE mg/dL
Nitrite: NEGATIVE
PROTEIN: NEGATIVE mg/dL
Specific Gravity, Urine: 1.012 (ref 1.005–1.030)
pH: 7 (ref 5.0–8.0)

## 2015-09-30 LAB — COMPREHENSIVE METABOLIC PANEL
ALBUMIN: 2.9 g/dL — AB (ref 3.5–5.0)
ALK PHOS: 57 U/L (ref 38–126)
ALT: 30 U/L (ref 14–54)
AST: 64 U/L — AB (ref 15–41)
Anion gap: 11 (ref 5–15)
BILIRUBIN TOTAL: 0.7 mg/dL (ref 0.3–1.2)
CALCIUM: 8.9 mg/dL (ref 8.9–10.3)
CO2: 22 mmol/L (ref 22–32)
Chloride: 101 mmol/L (ref 101–111)
Creatinine, Ser: 0.72 mg/dL (ref 0.44–1.00)
GFR calc Af Amer: >60 mL/min (ref >60)
GLUCOSE: 109 mg/dL — AB (ref 65–99)
POTASSIUM: 4.2 mmol/L (ref 3.5–5.1)
Sodium: 134 mmol/L — ABNORMAL LOW (ref 135–145)
TOTAL PROTEIN: 6.3 g/dL — AB (ref 6.5–8.1)

## 2015-09-30 LAB — TYPE AND SCREEN
ABO/RH(D): A NEG
ANTIBODY SCREEN: NEGATIVE
UNIT DIVISION: 0

## 2015-09-30 LAB — CBC
HEMATOCRIT: 26.5 % — AB (ref 36.0–46.0)
HEMOGLOBIN: 8.2 g/dL — AB (ref 12.0–15.0)
MCH: 24.4 pg — ABNORMAL LOW (ref 26.0–34.0)
MCHC: 30.9 g/dL (ref 30.0–36.0)
MCV: 78.9 fL (ref 78.0–100.0)
Platelets: 178 10*3/uL (ref 150–400)
RBC: 3.36 MIL/uL — ABNORMAL LOW (ref 3.87–5.11)
RDW: 21.1 % — AB (ref 11.5–15.5)
WBC: 6.6 10*3/uL (ref 4.0–10.5)

## 2015-09-30 LAB — URINE MICROSCOPIC-ADD ON: RBC / HPF: NONE SEEN RBC/hpf (ref 0–5)

## 2015-09-30 LAB — HCV RNA QUANT: HCV Quantitative: NOT DETECTED IU/mL (ref >50)
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
## 2015-10-01 ENCOUNTER — Encounter (HOSPITAL_COMMUNITY): Payer: Self-pay | Admitting: Physician Assistant

## 2015-10-01 ENCOUNTER — Inpatient Hospital Stay (HOSPITAL_COMMUNITY): Payer: Medicaid Other

## 2015-10-01 DIAGNOSIS — F10239 Alcohol dependence with withdrawal, unspecified: Secondary | ICD-10-CM

## 2015-10-01 DIAGNOSIS — F17200 Nicotine dependence, unspecified, uncomplicated: Secondary | ICD-10-CM

## 2015-10-01 LAB — PULMONARY FUNCTION TEST
DL/VA % pred: 54 %
DL/VA: 2.68 ml/min/mmHg/L
DLCO UNC: 13.43 ml/min/mmHg
DLCO unc % pred: 52 %
FEF 25-75 POST: 2.13 L/s
FEF 25-75 PRE: 1.96 L/s
FEF2575-%CHANGE-POST: 9 %
FEF2575-%PRED-PRE: 69 %
FEF2575-%Pred-Post: 75 %
FEV1-%Change-Post: 3 %
FEV1-%PRED-POST: 85 %
FEV1-%PRED-PRE: 82 %
FEV1-PRE: 2.38 L
FEV1-Post: 2.48 L
FEV1FVC-%CHANGE-POST: 7 %
FEV1FVC-%PRED-PRE: 94 %
FEV6-%Change-Post: -3 %
FEV6-%PRED-POST: 85 %
FEV6-%Pred-Pre: 88 %
FEV6-PRE: 3.13 L
FEV6-Post: 3.03 L
FEV6FVC-%PRED-PRE: 103 %
FEV6FVC-%Pred-Post: 103 %
FVC-%CHANGE-POST: -3 %
FVC-%Pred-Post: 82 %
FVC-%Pred-Pre: 86 %
FVC-Post: 3.03 L
FVC-Pre: 3.15 L
POST FEV1/FVC RATIO: 82 %
PRE FEV1/FVC RATIO: 76 %
Post FEV6/FVC ratio: 100 %
Pre FEV6/FVC Ratio: 100 %

## 2015-10-01 LAB — CBC
HEMATOCRIT: 30.8 % — AB (ref 36.0–46.0)
Hemoglobin: 9.4 g/dL — ABNORMAL LOW (ref 12.0–15.0)
MCH: 24.9 pg — AB (ref 26.0–34.0)
MCHC: 30.5 g/dL (ref 30.0–36.0)
MCV: 81.7 fL (ref 78.0–100.0)
PLATELETS: 217 10*3/uL (ref 150–400)
RBC: 3.77 MIL/uL — ABNORMAL LOW (ref 3.87–5.11)
RDW: 21.3 % — AB (ref 11.5–15.5)
WBC: 8.2 10*3/uL (ref 4.0–10.5)

## 2015-10-01 MED ORDER — VITAMIN B-12 100 MCG PO TABS
100.0000 ug | ORAL_TABLET | Freq: Every day | ORAL | Status: DC
  Administered 2015-10-02: 100 ug via ORAL
  Filled 2015-10-01: qty 1

## 2015-10-01 MED ORDER — ALBUTEROL SULFATE (2.5 MG/3ML) 0.083% IN NEBU
2.5000 mg | INHALATION_SOLUTION | Freq: Once | RESPIRATORY_TRACT | Status: AC
  Administered 2015-10-01: 2.5 mg via RESPIRATORY_TRACT
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
## 2015-10-01 NOTE — Progress Notes (Signed)
10/01/2015 11:34 AM  Patient requested to speak to GI MD [name] PA as patient has expressed reconsideration of having recommended procedure, PA notified. Sarah to let GI MD know. Will continue to monitor.

## 2015-10-01 NOTE — Progress Notes (Signed)
Physical Therapy Treatment Patient Details Name: Debbie Grimes MRN: [id number] DOB: Mar 04, 1966 Today's Date: 10/01/2015    History of Present Illness Debbie Grimes is a 50 year old female with PMH of COPD and Bipolar 1 disorder, heavy drinking, anxiety, depression who who presented to [facility] after a fall. She was at a friend's cookout last week (Wed or Thurs) when her right knee gave out on her and she fell to the ground from a standing position.     PT Comments    Pt was able to walk down the hallway to the RN station and was much more stable with RW use than without.  Recommending RW use for home.  PT to follow acutely until d/c confirmed.   .     Follow Up Recommendations  No PT follow up (Consider [facility] for chronic disease management)     Equipment Recommendations  Rolling walker with 5" wheels    Recommendations for Other Services   NA     Precautions / Restrictions Precautions Precautions: Fall Precaution Comments: watch HR    Mobility  Bed Mobility Overal bed mobility: Modified Independent             General bed mobility comments: Pt able to get EOB safely and easily  Transfers Overall transfer level: Needs assistance Equipment used: Rolling walker (2 wheeled) Transfers: Sit to/from Stand Sit to Stand: Supervision         General transfer comment: supervision for safety  Ambulation/Gait Ambulation/Gait assistance: Min guard Ambulation Distance (Feet): 150 Feet Assistive device: Rolling walker (2 wheeled) Gait Pattern/deviations: Step-through pattern;Antalgic Gait velocity: decreased Gait velocity interpretation: Below normal speed for age/gender General Gait Details: Pt started walking without RW and was reaching with both hands for supports in room.  Gave RW to patient and adjusted up for height and she was able to walk much better with RW.  Per pt report she only has a cane at home.           Balance Overall balance assessment:  Needs assistance Sitting-balance support: Feet supported;No upper extremity supported Sitting balance-Leahy Scale: Good     Standing balance support: Bilateral upper extremity supported Standing balance-Leahy Scale: Poor                      Cognition Arousal/Alertness: Awake/alert Behavior During Therapy: WFL for tasks assessed/performed Overall Cognitive Status: Within Functional Limits for tasks assessed (not specifically tested)                             Pertinent Vitals/Pain Pain Assessment: Faces Faces Pain Scale: Hurts a little bit Pain Location: right knee Pain Descriptors / Indicators: Sore Pain Intervention(s): Limited activity within patient's tolerance;Monitored during session;Repositioned           PT Goals (current goals can now be found in the care plan section) Acute Rehab PT Goals Patient Stated Goal: to go home so she can get some good rest.  Not sure if she wants colonoscopy yet.  Progress towards PT goals: Progressing toward goals    Frequency  Min 3X/week    PT Plan Current plan remains appropriate       End of Session   Activity Tolerance: Patient limited by pain;Patient limited by fatigue Patient left: in bed;with call bell/phone within reach     Time: 1435-1445 PT Time Calculation (min) (ACUTE ONLY): 10 min  Charges:  $Gait Training:  8-22 mins            Debbie Grimes B. [place], [place], DPT [phone]   10/01/2015, 4:11 PM

## 2015-10-01 NOTE — Progress Notes (Signed)
Daily Rounding Note  10/01/2015, 8:27 AM  LOS: 3 days    ASSESMENT:   * Anemia. Iron deficiency. Nutritional deficiency suspected.  FOBT negative x 2. S/p PRBC x 1.    Pt declines suggested EGD/Colonoscopy.    * Chronic ETOH abuse.   * HCV positive, HCV quant: not detected. HIV negative.   *  Alcoholic fatty liver disease.  Coags and platelets normal.  AST to 80s, ? Element of ETOH hepatitis.   * PCM.    * Depression, anxiety, bipolar d/o.  Untreated, previous psych admissions.     PLAN   *  ? Load her with parenteral iron as she is not reliable to continue with po iron.    *  Will sign off.  No plans for GI follow up.  Can schedule EGD/colonoscopy in future if she changes her mind.   *  ETOH abstinence.     [name]  10/01/2015, 8:27 AM Pager: [phone]   Patient off the floor and I could not see her. She is very ambivalent at best about having endoscopic procedures. Agree that they are indicated.  Not sure I can see her again today and though I understand she may not follow through as outpatient these are technically elective and she can have as an outpatient.  Re: liver dz - seems like past HCV infxn so she does not need to see ID as there will be no Tx  I can f/u on these  She does need HAV testing and HBV testing to look for immunity and vaccinate if naive - I ordered.  If she is here tomorrow will see her again.  I do think she needs mental health eval and Tx if she will comply.  [name], MD, [facility] [phone] (pager) [phone] after 5 PM, weekends and holidays  10/01/2015 3:31 PM    SUBJECTIVE:       Earlier today she had changed her mind and was ok with undergoing colon/egd.  She says medical team told her she could eat her meals today, was not informed this would mean clears.  After discussing risks, prep, benefits with pt she once again says she  does not want proocedures done or to be "put to sleep"   OBJECTIVE:         Vital signs in last 24 hours:    Temp:  [98.7 F (37.1 C)-99.6 F (37.6 C)] 99.1 F (37.3 C) (06/08 0403) Pulse Rate:  [95-109] 97 (06/08 0403) Resp:  [15-16] 16 (06/08 0403) BP: (94-119)/(54-72) 108/61 mmHg (06/08 0403) SpO2:  [96 %-100 %] 98 % (06/08 0403) Weight:  [48.535 kg (107 lb)] 48.535 kg (107 lb) (06/07 2135) Last BM Date: 09/28/15 Filed Weights   09/28/15 1941 09/29/15 2015 09/30/15 2135  Weight: 48.444 kg (106 lb 12.8 oz) 50.349 kg (111 lb) 48.535 kg (107 lb)   General: alert,  Looks old   Heart: RRR Chest: clear bil.  hoarse Abdomen: NT, ND.  Active GS  Extremities:  Thin, no CCE Neuro/Psych:  Emotional, tears while speaking. Oriented x 3.  Nothing to indicate she is not capable of making medical decisions for herself.    Lab Results:  Recent Labs  09/28/15 1341 09/29/15 0506 09/30/15 0553  WBC 10.3 6.5 6.6  HGB 7.6* 6.5* 8.2*  HCT 25.2* 21.3* 26.5*  PLT 209 176 178   BMET  Recent Labs  09/28/15 1345 09/29/15 0506 09/30/15 [id number]  NA 131* 132* 134*  K 3.8 3.0* 4.2  CL 96* 98* 101  CO2 23 24 22   GLUCOSE 85 95 109*  BUN 7 7 <5*  CREATININE 0.83 0.77 0.72  CALCIUM 9.4 8.7* 8.9   LFT  Recent Labs  09/28/15 1345 09/29/15 0506 09/30/15 0553  PROT 7.9 6.0* 6.3*  ALBUMIN 3.7 2.9* 2.9*  AST 83* 63* 64*  ALT 35 27 30  ALKPHOS 65 51 57  BILITOT 1.1 0.9 0.7    Studies/Results: US Abdomen Complete W/elastography  09/30/2015  CLINICAL DATA:  Hepatitis-C.  Alcohol abuse. EXAM: ULTRASOUND ABDOMEN COMPLETE ULTRASOUND HEPATIC ELASTOGRAPHY TECHNIQUE: Sonography of the upper abdomen was performed. In addition, ultrasound elastography evaluation of the liver was performed. A region of interest was placed within the right lobe of the liver. Following application of a compressive sonographic pulse, shear waves were detected in the adjacent hepatic tissue and the shear wave velocity  was calculated. Multiple assessments were performed at the selected site. Median shear wave velocity is correlated to a Metavir fibrosis score. COMPARISON:  CT from 05/31/2015 FINDINGS: ULTRASOUND ABDOMEN Gallbladder: No gallstones or wall thickening visualized. No sonographic Murphy sign noted by sonographer. Common bile duct: Diameter: 4.1 mm Liver: Diffusely increased liver parenchyma echogenicity compatible with hepatic steatosis. No focal liver abnormality noted. IVC: No abnormality visualized. Pancreas: Visualized portion unremarkable. Spleen: Size and appearance within normal limits. Right Kidney: Length: 12.1 cm. Stone within the upper pole measures 4 mm. Echogenicity within normal limits. No mass or hydronephrosis visualized. Left Kidney: Length: 11 cm. Cortical thinning noted. Echogenicity within normal limits. No mass or hydronephrosis visualized. Abdominal aorta: No aneurysm visualized. Other findings: None. ULTRASOUND HEPATIC ELASTOGRAPHY Device: Siemens Helix VTQ Patient position:  Supine Transducer 6 C1 Number of measurements:  10 Hepatic Segment:  8 Median velocity:   1.41  m/sec IQR: 0.26 IQR/Median velocity ratio 0.18 Corresponding Metavir fibrosis score:  F2 and some F3 Risk of fibrosis: Moderate Limitations of exam: None Pertinent findings noted on other imaging exams: Echogenic liver compatible with hepatic steatosis. Please note that abnormal shear wave velocities may also be identified in clinical settings other than with hepatic fibrosis, such as: acute hepatitis, elevated right heart and central venous pressures including use of beta blockers, veno-occlusive disease (Budd-Chiari), infiltrative processes such as mastocytosis/amyloidosis/infiltrative tumor, extrahepatic cholestasis, in the post-prandial state, and liver transplantation. Correlation with patient history, laboratory data, and clinical condition recommended. IMPRESSION: 1. The liver appears echogenic suggestive of hepatic  steatosis. Median hepatic shear wave velocity is calculated at 1.41 m/sec. Corresponding Metavir fibrosis score is F2 and some F3. Risk of fibrosis is moderate. Follow-up:  Additional testing appropriate Electronically Signed   By: Kerby Moors M.D.   On: 09/30/2015 16:11

## 2015-10-02 LAB — CBC
HEMATOCRIT: 28 % — AB (ref 36.0–46.0)
HEMOGLOBIN: 8.4 g/dL — AB (ref 12.0–15.0)
MCH: 24 pg — AB (ref 26.0–34.0)
MCHC: 30 g/dL (ref 30.0–36.0)
MCV: 80 fL (ref 78.0–100.0)
Platelets: 234 10*3/uL (ref 150–400)
RBC: 3.5 MIL/uL — AB (ref 3.87–5.11)
RDW: 21.3 % — ABNORMAL HIGH (ref 11.5–15.5)
WBC: 6.4 10*3/uL (ref 4.0–10.5)

## 2015-10-02 MED ORDER — CYANOCOBALAMIN 100 MCG PO TABS: 100.0000 ug | ORAL_TABLET | Freq: Every day | ORAL | Status: DC

## 2015-10-02 MED ORDER — ADULT MULTIVITAMIN W/MINERALS CH
1.0000 | ORAL_TABLET | Freq: Every day | ORAL | Status: DC
Start: 2015-10-02 — End: 2017-11-09

## 2015-10-02 MED ORDER — NICOTINE 21 MG/24HR TD PT24: 21.0000 mg | MEDICATED_PATCH | Freq: Every day | TRANSDERMAL | Status: DC

## 2015-10-02 MED ORDER — THIAMINE HCL 100 MG PO TABS: 100.0000 mg | ORAL_TABLET | Freq: Every day | ORAL | Status: DC

## 2015-10-02 MED ORDER — FERROUS SULFATE 325 (65 FE) MG PO TABS: 325.0000 mg | ORAL_TABLET | Freq: Every day | ORAL | Status: DC

## 2015-10-02 NOTE — Discharge Instructions (Signed)
Please follow up with Dr. Adrian Grimes and Dr. Carlean Grimes. Dr. Celesta Grimes clinic will reach out to you to schedule an appointment and possible colonoscopy.  Continue the Nicotine patch to help cut back on your smoking. Please try your best to reduce your alcohol intake. Please continue the medications as prescribed, including iron, B12, and vitamin supplements.  Please monitor for any signs of bleeding. Avoid NSAID medications (ibuprofen, advil, meloxicam, naproxen). These can cause inflammation and bleeding in the stomach.   Alcohol Abuse and Nutrition Alcohol abuse is any pattern of alcohol consumption that harms your health, relationships, or work. Alcohol abuse can affect how your body breaks down and absorbs nutrients from food by causing your liver to work abnormally. Additionally, many people who abuse alcohol do not eat enough carbohydrates, protein, fat, vitamins, and minerals. This can cause poor nutrition (malnutrition) and a lack of nutrients (nutrient deficiencies), which can lead to further complications. Nutrients that are commonly lacking (deficient) among people who abuse alcohol include:  Vitamins.  Vitamin A. This is stored in your liver. It is important for your vision, metabolism, and ability to fight off infections (immunity).  B vitamins. These include vitamins such as folate, thiamin, and niacin. These are important in new cell growth and maintenance.  Vitamin C. This plays an important role in iron absorption, wound healing, and immunity.  Vitamin D. This is produced by your liver, but you can also get vitamin D from food. Vitamin D is necessary for your body to absorb and use calcium.  Minerals.  Calcium. This is important for your bones and your heart and blood vessel (cardiovascular) function.  Iron. This is important for blood, muscle, and nervous system functioning.  Magnesium. This plays an important role in muscle and nerve function, and it helps to control blood  sugar and blood pressure.  Zinc. This is important for the normal function of your nervous system and digestive system (gastrointestinal tract). Nutrition is an essential component of therapy for alcohol abuse. Your health care provider or dietitian will work with you to design a plan that can help restore nutrients to your body and prevent potential complications. WHAT IS MY PLAN? Your dietitian may develop a specific diet plan that is based on your condition and any other complications you may have. A diet plan will commonly include:  A balanced diet.  Grains: 6-8 oz per day.  Vegetables: 2-3 cups per day.  Fruits: 1-2 cups per day.  Meat and other protein: 5-6 oz per day.  Dairy: 2-3 cups per day.  Vitamin and mineral supplements. WHAT DO I NEED TO KNOW ABOUT ALCOHOL AND NUTRITION?  Consume foods that are high in antioxidants, such as grapes, berries, nuts, green tea, and dark green and orange vegetables. This can help to counteract some of the stress that is placed on your liver by consuming alcohol.  Avoid food and drinks that are high in fat and sugar. Foods such as sugared soft drinks, salty snack foods, and candy contain empty calories. This means that they lack important nutrients such as protein, fiber, and vitamins.  Eat frequent meals and snacks. Try to eat 5-6 small meals each day.  Eat a variety of fresh fruits and vegetables each day. This will help you get plenty of water, fiber, and vitamins in your diet.  Drink plenty of water and other clear fluids. Try to drink at least 48-64 oz (1.5-2 L) of water per day.  If you are a vegetarian, eat a variety of  protein-rich foods. Pair whole grains with plant-based proteins at meals and snacks to obtain the greatest nutrient benefit from your food. For example, eat rice with beans, put peanut butter on whole-grain toast, or eat oatmeal with sunflower seeds.  Soak beans and whole grains overnight before cooking. This can help  your body to absorb the nutrients more easily.  Include foods fortified with vitamins and minerals in your diet. Commonly fortified foods include milk, orange juice, cereal, and bread.  If you are malnourished, your dietitian may recommend a high-protein, high-calorie diet. This may include:  2,000-3,000 calories (kilocalories) per day.  70-100 grams of protein per day.  Your health care provider may recommend a complete nutritional supplement beverage. This can help to restore calories, protein, and vitamins to your body. Depending on your condition, you may be advised to consume this instead of or in addition to meals.  Limit your intake of caffeine. Replace drinks like coffee and black tea with decaffeinated coffee and herbal tea.  Eat a variety of foods that are high in omega fatty acids. These include fish, nuts and seeds, and soybeans. These foods may help your liver to recover and may also stabilize your mood.  Certain medicines may cause changes in your appetite, taste, and weight. Work with your health care provider and dietitian to make any adjustments to your medicines and diet plan.  Include other healthy lifestyle choices in your daily routine.  Be physically active.  Get enough sleep.  Spend time doing activities that you enjoy.  If you are unable to take in enough food and calories by mouth, your health care provider may recommend a feeding tube. This is a tube that passes through your nose and throat, directly into your stomach. Nutritional supplement beverages can be given to you through the feeding tube to help you get the nutrients you need.  Take vitamin or mineral supplements as recommended by your health care provider. WHAT FOODS CAN I EAT? Grains Enriched pasta. Enriched rice. Fortified whole-grain bread. Fortified whole-grain cereal. Barley. Brown rice. Quinoa. [place]. Vegetables All fresh, frozen, and canned vegetables. Spinach. Kale. Artichoke. Carrots.  Winter squash and pumpkin. Sweet potatoes. Broccoli. Cabbage. Cucumbers. Tomatoes. Sweet peppers. Green beans. Peas. Corn. Fruits All fresh and frozen fruits. Berries. Grapes. Mango. Papaya. Guava. Cherries. Apples. Bananas. Peaches. Plums. Pineapple. Watermelon. Cantaloupe. Oranges. Avocado. Meats and Other Protein Sources Beef liver. Lean beef. Pork. Fresh and canned chicken. Fresh fish. Oysters. Sardines. Canned tuna. Shrimp. Eggs with yolks. Nuts and seeds. Peanut butter. Beans and lentils. Soybeans. Tofu. Dairy Whole, low-fat, and nonfat milk. Whole, low-fat, and nonfat yogurt. Cottage cheese. Sour cream. Hard and soft cheeses. Beverages Water. Herbal tea. Decaffeinated coffee. Decaffeinated green tea. 100% fruit juice. 100% vegetable juice. Instant breakfast shakes. Condiments Ketchup. Mayonnaise. Mustard. Salad dressing. Barbecue sauce. Sweets and Desserts Sugar-free ice cream. Sugar-free pudding. Sugar-free gelatin. Fats and Oils Butter. Vegetable oil, flaxseed oil, olive oil, and walnut oil. Other Complete nutrition shakes. Protein bars. Sugar-free gum. The items listed above may not be a complete list of recommended foods or beverages. Contact your dietitian for more options. WHAT FOODS ARE NOT RECOMMENDED? Grains Sugar-sweetened breakfast cereals. Flavored instant oatmeal. Fried breads. Vegetables Breaded or deep-fried vegetables. Fruits Dried fruit with added sugar. Candied fruit. Canned fruit in syrup. Meats and Other Protein Sources Breaded or deep-fried meats. Dairy Flavored milks. Fried cheese curds or fried cheese sticks. Beverages Alcohol. Sugar-sweetened soft drinks. Sugar-sweetened tea. Caffeinated coffee and tea. Condiments Sugar. Honey. Agave nectar.  Molasses. Sweets and Desserts Chocolate. Cake. Cookies. Candy. Other Potato chips. Pretzels. Salted nuts. Candied nuts. The items listed above may not be a complete list of foods and beverages to avoid. Contact  your dietitian for more information.   This information is not intended to replace advice given to you by your health care provider. Make sure you discuss any questions you have with your health care provider.   Document Released: 02/03/2005 Document Revised: 05/02/2014 Document Reviewed: 11/12/2013 Elsevier Interactive Patient Education [employer].

## 2015-10-02 NOTE — Care Management Note (Signed)
Case Management Note  Patient Details  Name: Debbie Grimes MRN: [id number] Date of Birth: [birth year]  Subjective/Objective:    Alcohol abuse with gait instability                Action/Plan: Discharge Planning: AVS reviewed: No PT recommended. NCM spoke to pt and states she is ambulatory and declined DME.  PCP- Boykin Nearing MD   Expected Discharge Date:  10/02/15               Expected Discharge Plan:  Home/Self Care  In-House Referral:  NA  Discharge planning Services  CM Consult  Post Acute Care Choice:  NA Choice offered to:  NA  DME Arranged:  N/A DME Agency:  NA  HH Arranged:  NA HH Agency:  NA  Status of Service:  Completed, signed off  Medicare Important Message Given:    Date Medicare IM Given:    Medicare IM give by:    Date Additional Medicare IM Given:    Additional Medicare Important Message give by:     If discussed at [place] of Stay Meetings, dates discussed:    Additional Comments:  Erenest Rasher, RN 10/02/2015, 12:21 PM

## 2015-10-02 NOTE — Progress Notes (Signed)
Patient Discharge: orders in place  Disposition: Accepting and Ready for discharge, instructions and scripts given  Education: Patient educated on nutrition, alcohol and cigarette smoking goals.  IV: Saline Lock removed  Telemetry: None  Follow-up appointments: Reviewed with patient  Prescriptions: In hand  Transportation: Sister picking up patient  Belongings: with the patient
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
## 2015-10-02 NOTE — Progress Notes (Signed)
Occupational Therapy Treatment Patient Details Name: Debbie Grimes MRN: [id number] DOB: 03-Aug-1965 Today's Date: 10/02/2015    History of present illness Ms. Magdlene Prak is a 50 year old female with PMH of COPD and Bipolar 1 disorder, heavy drinking, anxiety, depression who who presented to [facility] ED after a fall. She was at a friend's cookout last week (Wed or Thurs) when her right knee gave out on her and she fell to the ground from a standing position.    OT comments  Pt making progress and very anxious to d/c home.   Follow Up Recommendations  No OT follow up;Supervision - Intermittent    Equipment Recommendations  None recommended by OT    Recommendations for Other Services      Precautions / Restrictions Precautions Precautions: Fall Restrictions Weight Bearing Restrictions: No       Mobility Bed Mobility Overal bed mobility: Modified Independent                Transfers Overall transfer level: Modified independent                    Balance     Sitting balance-Leahy Scale: Good       Standing balance-Leahy Scale: Good                     ADL Overall ADL's : Needs assistance/impaired     Grooming: Wash/dry hands;Wash/dry face;Brushing hair;Standing;Modified independent               Lower Body Dressing: Modified independent;Supervision/safety;Sit to/from stand   Toilet Transfer: Supervision/safety;Modified Independent   Writer and Hygiene: Modified independent   Tub/ Banker: Modified independent;Supervision/safety   Functional mobility during ADLs: Supervision/safety;Modified independent General ADL Comments: pt anxious to go home      Vision  no change from baseline                              Cognition   Behavior During Therapy: [facility] for tasks assessed/performed Overall Cognitive Status: Within Functional Limits for tasks assessed                       Extremity/Trunk Assessment   WFL            EXERCISES  pt declined stating that she felt better and she will exercise at home          General Comments  pt pleasant, anxious    Pertinent Vitals/ Pain       Pain Assessment: 0-10 Pain Score: 4  Pain Location: IV site Pain Descriptors / Indicators: Sore;Discomfort  Home Living  home with husband                                        Prior Functioning/Environment  independent           Frequency Min 2X/week     Progress Toward Goals  OT Goals(current goals can now be found in the care plan section)  Progress towards OT goals: Progressing toward goals     Plan Discharge plan remains appropriate                     End of Session     Activity Tolerance Patient tolerated treatment well  Patient Left with call bell/phone within reach;in bed       Time: [id number] OT Time Calculation (min): 24 min  Charges: OT General Charges $OT Visit: 1 Procedure OT Treatments $Self Care/Home Management : 8-22 mins $Therapeutic Activity: 8-22 mins  Britt Bottom 10/02/2015, 1:07 PM
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
## 2015-10-03 DIAGNOSIS — Z789 Other specified health status: Secondary | ICD-10-CM

## 2015-10-03 DIAGNOSIS — Z0184 Encounter for antibody response examination: Secondary | ICD-10-CM

## 2015-10-03 LAB — HEPATITIS B CORE ANTIBODY, TOTAL: HEP B C TOTAL AB: NEGATIVE

## 2015-10-03 LAB — HEPATITIS A ANTIBODY, TOTAL: HEP A TOTAL AB: POSITIVE — AB

## 2015-10-03 LAB — HEPATITIS B SURFACE ANTIBODY,QUALITATIVE: Hep B S Ab: NONREACTIVE

## 2015-10-05 ENCOUNTER — Encounter: Payer: Self-pay | Admitting: Internal Medicine

## 2015-10-12 ENCOUNTER — Telehealth: Payer: Self-pay | Admitting: *Deleted

## 2015-10-12 NOTE — Telephone Encounter (Signed)
Unable to contact pt  No voice mail  

## 2015-10-12 NOTE — Telephone Encounter (Signed)
-----   Message from Boykin Nearing, MD sent at 10/03/2015 12:28 PM EDT ----- Hep A immune Hep B non-immune, please call patient to come in for Hep B vaccination, 1st of 3.  Also patient needs HFU appt with me.

## 2015-12-16 ENCOUNTER — Ambulatory Visit: Payer: Self-pay | Admitting: Internal Medicine

## 2016-12-05 ENCOUNTER — Encounter: Payer: Self-pay | Admitting: Internal Medicine

## 2016-12-08 ENCOUNTER — Emergency Department (HOSPITAL_COMMUNITY)
Admission: EM | Admit: 2016-12-08 | Discharge: 2016-12-09 | Disposition: A | Discharge status: Home / Self Care | Payer: Medicaid Other | Attending: Emergency Medicine | Admitting: Emergency Medicine

## 2016-12-08 ENCOUNTER — Encounter (HOSPITAL_COMMUNITY): Payer: Self-pay | Admitting: Emergency Medicine

## 2016-12-08 DIAGNOSIS — J449 Chronic obstructive pulmonary disease, unspecified: Secondary | ICD-10-CM | POA: Diagnosis not present

## 2016-12-08 DIAGNOSIS — Z79899 Other long term (current) drug therapy: Secondary | ICD-10-CM | POA: Diagnosis not present

## 2016-12-08 DIAGNOSIS — R109 Unspecified abdominal pain: Secondary | ICD-10-CM

## 2016-12-08 DIAGNOSIS — G8929 Other chronic pain: Secondary | ICD-10-CM | POA: Diagnosis not present

## 2016-12-08 DIAGNOSIS — D649 Anemia, unspecified: Secondary | ICD-10-CM | POA: Insufficient documentation

## 2016-12-08 DIAGNOSIS — F17210 Nicotine dependence, cigarettes, uncomplicated: Secondary | ICD-10-CM | POA: Insufficient documentation

## 2016-12-08 DIAGNOSIS — N390 Urinary tract infection, site not specified: Secondary | ICD-10-CM | POA: Diagnosis not present

## 2016-12-08 DIAGNOSIS — R8281 Pyuria: Secondary | ICD-10-CM

## 2016-12-08 DIAGNOSIS — R1030 Lower abdominal pain, unspecified: Secondary | ICD-10-CM | POA: Diagnosis present

## 2016-12-08 LAB — CBC
HEMATOCRIT: 33.6 % — AB (ref 36.0–46.0)
HEMOGLOBIN: 11.3 g/dL — AB (ref 12.0–15.0)
MCH: 29.5 pg (ref 26.0–34.0)
MCHC: 33.6 g/dL (ref 30.0–36.0)
MCV: 87.7 fL (ref 78.0–100.0)
Platelets: 211 10*3/uL (ref 150–400)
RBC: 3.83 MIL/uL — AB (ref 3.87–5.11)
RDW: 18.4 % — AB (ref 11.5–15.5)
WBC: 8.4 10*3/uL (ref 4.0–10.5)

## 2016-12-08 LAB — COMPREHENSIVE METABOLIC PANEL
ALT: 29 U/L (ref 14–54)
ANION GAP: 13 (ref 5–15)
AST: 88 U/L — AB (ref 15–41)
Albumin: 4.1 g/dL (ref 3.5–5.0)
Alkaline Phosphatase: 103 U/L (ref 38–126)
BILIRUBIN TOTAL: 0.3 mg/dL (ref 0.3–1.2)
BUN: 5 mg/dL — ABNORMAL LOW (ref 6–20)
CHLORIDE: 101 mmol/L (ref 101–111)
CO2: 24 mmol/L (ref 22–32)
Calcium: 9.1 mg/dL (ref 8.9–10.3)
Creatinine, Ser: 1.14 mg/dL — ABNORMAL HIGH (ref 0.44–1.00)
GFR calc Af Amer: >60 mL/min (ref >60)
GFR, EST NON AFRICAN AMERICAN: 55 mL/min — AB (ref >60)
Glucose, Bld: 93 mg/dL (ref 65–99)
POTASSIUM: 3.1 mmol/L — AB (ref 3.5–5.1)
Sodium: 138 mmol/L (ref 135–145)
TOTAL PROTEIN: 8.4 g/dL — AB (ref 6.5–8.1)

## 2016-12-08 LAB — LIPASE, BLOOD: LIPASE: 71 U/L — AB (ref 11–51)

## 2016-12-08 NOTE — ED Triage Notes (Signed)
Per EMS, patient from home, c/o left sided abdominal pain x3 months. Denies N/V/D, chest pain, and SOB. Hx alcohol abuse. Reports drinking alcohol today.   BP 120/70 HR 60

## 2016-12-09 ENCOUNTER — Emergency Department (HOSPITAL_COMMUNITY): Payer: Medicaid Other

## 2016-12-09 LAB — URINALYSIS, ROUTINE W REFLEX MICROSCOPIC
Bilirubin Urine: NEGATIVE
GLUCOSE, UA: NEGATIVE mg/dL
KETONES UR: NEGATIVE mg/dL
Nitrite: POSITIVE — AB
PROTEIN: NEGATIVE mg/dL
Specific Gravity, Urine: 1.009 (ref 1.005–1.030)
pH: 6 (ref 5.0–8.0)

## 2016-12-09 MED ORDER — PANTOPRAZOLE SODIUM 40 MG IV SOLR
40.0000 mg | Freq: Once | INTRAVENOUS | Status: AC
  Administered 2016-12-09: 40 mg via INTRAVENOUS
  Filled 2016-12-09: qty 40

## 2016-12-09 MED ORDER — IOPAMIDOL (ISOVUE-300) INJECTION 61%
100.0000 mL | Freq: Once | INTRAVENOUS | Status: AC | PRN
  Administered 2016-12-09: 100 mL via INTRAVENOUS

## 2016-12-09 MED ORDER — SODIUM CHLORIDE 0.9 % IV BOLUS (SEPSIS)
1000.0000 mL | Freq: Once | INTRAVENOUS | Status: AC
  Administered 2016-12-09: 1000 mL via INTRAVENOUS

## 2016-12-09 MED ORDER — KETOROLAC TROMETHAMINE 30 MG/ML IJ SOLN
30.0000 mg | Freq: Once | INTRAMUSCULAR | Status: AC
  Administered 2016-12-09: 30 mg via INTRAVENOUS
  Filled 2016-12-09: qty 1

## 2016-12-09 MED ORDER — FOSFOMYCIN TROMETHAMINE 3 G PO PACK
3.0000 g | PACK | Freq: Once | ORAL | Status: AC
  Administered 2016-12-09: 3 g via ORAL
  Filled 2016-12-09: qty 3

## 2016-12-09 MED ORDER — IOPAMIDOL (ISOVUE-300) INJECTION 61%
30.0000 mL | Freq: Once | INTRAVENOUS | Status: AC | PRN
  Administered 2016-12-09: 30 mL via ORAL

## 2016-12-09 MED ORDER — IOPAMIDOL (ISOVUE-300) INJECTION 61%
INTRAVENOUS | Status: AC
  Filled 2016-12-09: qty 100

## 2016-12-09 MED ORDER — ACETAMINOPHEN 500 MG PO TABS: 1000.0000 mg | ORAL_TABLET | Freq: Four times a day (QID) | ORAL | 0 refills | Status: DC | PRN

## 2016-12-09 MED ORDER — IOPAMIDOL (ISOVUE-300) INJECTION 61%
INTRAVENOUS | Status: AC
  Administered 2016-12-09: 30 mL via ORAL
  Filled 2016-12-09: qty 30

## 2016-12-09 NOTE — Discharge Instructions (Signed)
Your CT today was very reassuring. There is no evidence of a mass or cancer. Your urine today did suggest a urinary tract infection. This has been treated in the emergency department with an antibiotic. We advise Tylenol for persistent pain. Follow-up with a primary care doctor to ensure resolution of symptoms. We advise that you continue with plans for colonoscopy. You may return to the emergency department, as needed, for new or concerning symptoms.
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
## 2016-12-30 ENCOUNTER — Encounter (HOSPITAL_COMMUNITY): Payer: Self-pay | Admitting: *Deleted

## 2016-12-30 ENCOUNTER — Emergency Department (HOSPITAL_COMMUNITY)
Admission: EM | Admit: 2016-12-30 | Discharge: 2016-12-30 | Disposition: A | Discharge status: Home / Self Care | Payer: Medicaid Other | Attending: Emergency Medicine | Admitting: Emergency Medicine

## 2016-12-30 DIAGNOSIS — M79671 Pain in right foot: Secondary | ICD-10-CM | POA: Insufficient documentation

## 2016-12-30 LAB — CBC WITH DIFFERENTIAL/PLATELET
BASOS ABS: 0.1 10*3/uL (ref 0.0–0.1)
Basophils Relative: 1 %
EOS ABS: 0.2 10*3/uL (ref 0.0–0.7)
Eosinophils Relative: 4 %
HCT: 36.2 % (ref 36.0–46.0)
Hemoglobin: 12 g/dL (ref 12.0–15.0)
LYMPHS ABS: 2.5 10*3/uL (ref 0.7–4.0)
Lymphocytes Relative: 40 %
MCH: 30.6 pg (ref 26.0–34.0)
MCHC: 33.1 g/dL (ref 30.0–36.0)
MCV: 92.3 fL (ref 78.0–100.0)
Monocytes Absolute: 0.6 10*3/uL (ref 0.1–1.0)
Monocytes Relative: 9 %
NEUTROS ABS: 2.8 10*3/uL (ref 1.7–7.7)
Neutrophils Relative %: 46 %
PLATELETS: 191 10*3/uL (ref 150–400)
RBC: 3.92 MIL/uL (ref 3.87–5.11)
RDW: 21.5 % — AB (ref 11.5–15.5)
WBC: 6.2 10*3/uL (ref 4.0–10.5)

## 2016-12-30 LAB — COMPREHENSIVE METABOLIC PANEL
ALBUMIN: 3.8 g/dL (ref 3.5–5.0)
ALT: 26 U/L (ref 14–54)
AST: 96 U/L — ABNORMAL HIGH (ref 15–41)
Alkaline Phosphatase: 92 U/L (ref 38–126)
Anion gap: 13 (ref 5–15)
BUN: <5 mg/dL — ABNORMAL LOW (ref 6–20)
CO2: 22 mmol/L (ref 22–32)
Calcium: 8.8 mg/dL — ABNORMAL LOW (ref 8.9–10.3)
Chloride: 105 mmol/L (ref 101–111)
Creatinine, Ser: 0.73 mg/dL (ref 0.44–1.00)
GFR calc Af Amer: >60 mL/min (ref >60)
GFR calc non Af Amer: >60 mL/min (ref >60)
GLUCOSE: 88 mg/dL (ref 65–99)
POTASSIUM: 3.9 mmol/L (ref 3.5–5.1)
SODIUM: 140 mmol/L (ref 135–145)
Total Bilirubin: 0.4 mg/dL (ref 0.3–1.2)
Total Protein: 7.7 g/dL (ref 6.5–8.1)

## 2016-12-30 NOTE — ED Triage Notes (Signed)
Pt c/o R foot pain with bruising to R upper foot on the 2nd and 3 rd toes, pt denies hx of circulation problems, pt reports trouble walking, strong DP pulse present in triage, skin warm to touch, skin intact, no swelling noted, pt A&O x4

## 2016-12-30 NOTE — ED Notes (Signed)
This tech knew where pt has been sitting in waiting area. Pt. Has not been seen since she was called for recheck vitals at 1735 and did not answer

## 2016-12-30 NOTE — ED Notes (Signed)
Called pt for vitals, no answer in lobby

## 2016-12-30 NOTE — ED Notes (Signed)
According to the previous nurse first, this pt left.

## 2017-01-27 ENCOUNTER — Emergency Department (HOSPITAL_COMMUNITY)
Admission: EM | Admit: 2017-01-27 | Discharge: 2017-01-27 | Disposition: A | Discharge status: Home / Self Care | Payer: Medicaid Other | Attending: Emergency Medicine | Admitting: Emergency Medicine

## 2017-01-27 ENCOUNTER — Emergency Department (HOSPITAL_COMMUNITY): Payer: Medicaid Other

## 2017-01-27 ENCOUNTER — Encounter (HOSPITAL_COMMUNITY): Payer: Self-pay | Admitting: *Deleted

## 2017-01-27 DIAGNOSIS — Y9389 Activity, other specified: Secondary | ICD-10-CM | POA: Diagnosis not present

## 2017-01-27 DIAGNOSIS — Y998 Other external cause status: Secondary | ICD-10-CM | POA: Insufficient documentation

## 2017-01-27 DIAGNOSIS — R51 Headache: Secondary | ICD-10-CM | POA: Diagnosis not present

## 2017-01-27 DIAGNOSIS — J449 Chronic obstructive pulmonary disease, unspecified: Secondary | ICD-10-CM | POA: Insufficient documentation

## 2017-01-27 DIAGNOSIS — M542 Cervicalgia: Secondary | ICD-10-CM | POA: Diagnosis not present

## 2017-01-27 DIAGNOSIS — S81011A Laceration without foreign body, right knee, initial encounter: Secondary | ICD-10-CM | POA: Insufficient documentation

## 2017-01-27 DIAGNOSIS — Y92093 Driveway of other non-institutional residence as the place of occurrence of the external cause: Secondary | ICD-10-CM | POA: Insufficient documentation

## 2017-01-27 DIAGNOSIS — F17210 Nicotine dependence, cigarettes, uncomplicated: Secondary | ICD-10-CM | POA: Diagnosis not present

## 2017-01-27 DIAGNOSIS — Z79899 Other long term (current) drug therapy: Secondary | ICD-10-CM | POA: Diagnosis not present

## 2017-01-27 DIAGNOSIS — F10929 Alcohol use, unspecified with intoxication, unspecified: Secondary | ICD-10-CM | POA: Diagnosis not present

## 2017-01-27 DIAGNOSIS — W01198A Fall on same level from slipping, tripping and stumbling with subsequent striking against other object, initial encounter: Secondary | ICD-10-CM | POA: Diagnosis not present

## 2017-01-27 DIAGNOSIS — Z23 Encounter for immunization: Secondary | ICD-10-CM | POA: Insufficient documentation

## 2017-01-27 DIAGNOSIS — W19XXXA Unspecified fall, initial encounter: Secondary | ICD-10-CM

## 2017-01-27 MED ORDER — TETANUS-DIPHTH-ACELL PERTUSSIS 5-2.5-18.5 LF-MCG/0.5 IM SUSP
0.5000 mL | Freq: Once | INTRAMUSCULAR | Status: AC
  Administered 2017-01-27: 0.5 mL via INTRAMUSCULAR
  Filled 2017-01-27: qty 0.5

## 2017-01-27 MED ORDER — LIDOCAINE-EPINEPHRINE 1 %-1:100000 IJ SOLN: 20.0000 mL | Freq: Once | INTRAMUSCULAR | Status: DC

## 2017-01-27 MED ORDER — LIDOCAINE-EPINEPHRINE (PF) 2 %-1:200000 IJ SOLN
20.0000 mL | Freq: Once | INTRAMUSCULAR | Status: AC
  Administered 2017-01-27: 20 mL via INTRADERMAL
  Filled 2017-01-27: qty 20

## 2017-01-27 NOTE — ED Provider Notes (Signed)
[facility] DEPT Provider Note   CSN: [id number] Arrival date & time: 01/27/17  1851     History   Chief Complaint Chief Complaint  Patient presents with  . Fall    HPI Debbie Grimes is a 51 y.o. female.  51 yo F with a chief complaint of right knee laceration. Occurred after the patient fell. Says that she tripped on the bricks in her driveway. Thinks that she may have struck her head. Has been drinking today. Having some midline neck pain as well. Denies chest pain abdominal pain shortness of breath back pain. Denies upper extremity pain. Has a laceration to the right knee which she describes as painful. Able to bear weight and able to ambulate.   The history is provided by the patient.  Fall  This is a new problem. The current episode started 1 to 2 hours ago. The problem occurs constantly. The problem has not changed since onset.Pertinent negatives include no chest pain, no headaches and no shortness of breath. Nothing aggravates the symptoms. Nothing relieves the symptoms. She has tried nothing for the symptoms. The treatment provided no relief.    Past Medical History:  Diagnosis Date  . Alcoholism ([place]) 2000   chronic  . Bipolar 1 disorder ([place]) 2010   with depression and anxiety  . COPD (chronic obstructive pulmonary disease) ([place])   . Hepatic steatosis 07/3152   alcoholic fatty liver.  Metavir fibrosis score F 2 with some F3  . Hepatitis C antibody test positive 09/2015   HCV quant: no virus detected.   . Polysubstance abuse ([place]) 2000  . Protein-calorie malnutrition ([place]) 2012    Patient Active Problem List   Diagnosis Date Noted  . 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 Hepatic steatosis 09/30/2015  . Anemia, iron deficiency   . Chronic hepatitis C without hepatic coma ([place])   . Alcoholic liver disease ([place])   . Protein-calorie malnutrition, severe 09/29/2015  . Alcohol withdrawal ([place]) 09/28/2015  .  Normocytic anemia 07/12/2015  . Leg wound, left 07/10/2015  . Alcoholic ([place]) 00/86/7619  . Heavy cigarette smoker (20-39 per day) 07/10/2015    Past Surgical History:  Procedure Laterality Date  . excision nasal sebaceous cyst  06/2003  . hallux laceration, repair distal phalanx fracture Left 01/2011   lawn mower injury.    OB History    Gravida Para Term Preterm AB Living   2 2 0 0 0     SAB TAB Ectopic Multiple Live Births   0 0 0           Home Medications    Prior to Admission medications   Medication Sig Start Date End Date Taking? Authorizing Provider  acetaminophen (TYLENOL) 500 MG tablet Take 2 tablets (1,000 mg total) by mouth every 6 (six) hours as needed for mild pain. 12/09/16   Antonietta Breach, PA-C  albuterol (PROVENTIL HFA;VENTOLIN HFA) 108 (90 Base) MCG/ACT inhaler Inhale 1-2 puffs into the lungs every 6 (six) hours as needed for wheezing or shortness of breath.    [provider]  ferrous sulfate 325 (65 FE) MG tablet Take 1 tablet (325 mg total) by mouth daily with breakfast. Patient not taking: Reported on 12/09/2016 10/02/15   Zada Finders, MD  folic acid (FOLVITE) 1 MG tablet Take 1 tablet (1 mg total) by mouth daily. Patient not taking: Reported on 12/09/2016 07/12/15   Boykin Nearing, MD  Multiple Vitamin (MULTIVITAMIN WITH MINERALS) TABS  tablet Take 1 tablet by mouth daily. 10/02/15   Zada Finders, MD  nicotine (NICODERM CQ - DOSED IN MG/24 HOURS) 21 mg/24hr patch Place 1 patch (21 mg total) onto the skin daily. Patient not taking: Reported on 12/09/2016 10/02/15   Zada Finders, MD  PRESCRIPTION MEDICATION Take 1 tablet by mouth daily.    [provider]  thiamine 100 MG tablet Take 1 tablet (100 mg total) by mouth daily. 10/02/15   Zada Finders, MD  tiotropium (SPIRIVA) 18 MCG inhalation capsule Place 18 mcg into inhaler and inhale daily.    [provider]  Tiotropium Bromide-Olodaterol (STIOLTO RESPIMAT) 2.5-2.5 MCG/ACT AERS Inhale 1  Inhaler into the lungs daily.    [provider]  vitamin B-12 100 MCG tablet Take 1 tablet (100 mcg total) by mouth daily. Patient not taking: Reported on 12/09/2016 10/02/15   Zada Finders, MD    Family History No family history on file.  Social History Social History  Substance Use Topics  . Smoking status: Current Every Day Smoker    Packs/day: 2.00    Years: 37.00    Types: Cigarettes  . Smokeless tobacco: Never Used     Comment: 2-3 packs/day since age 5  . Alcohol use 0.0 oz/week     Comment: "2 40 oz beers per day     Allergies   Patient has no known allergies.   Review of Systems Review of Systems  Constitutional: Negative for chills and fever.  HENT: Negative for congestion and rhinorrhea.   Eyes: Negative for redness and visual disturbance.  Respiratory: Negative for shortness of breath and wheezing.   Cardiovascular: Negative for chest pain and palpitations.  Gastrointestinal: Negative for nausea and vomiting.  Genitourinary: Negative for dysuria and urgency.  Musculoskeletal: Positive for neck pain. Negative for arthralgias and myalgias.  Skin: Positive for wound. Negative for pallor.  Neurological: Negative for dizziness and headaches.     Physical Exam Updated Vital Signs BP 110/77   Pulse 89   Temp 97.9 F (36.6 C) (Oral)   Resp 16   Ht 5\' 7"  (1.702 m)   Wt 48.5 kg (107 lb)   SpO2 100%   BMI 16.76 kg/m   Physical Exam  Constitutional: She is oriented to person, place, and time. She appears well-developed and well-nourished. No distress.  Clinically intoxicated  HENT:  Head: Normocephalic and atraumatic.  Mild ttp about the lower l spine  Eyes: Pupils are equal, round, and reactive to light. EOM are normal.  Neck: Normal range of motion. Neck supple.  Cardiovascular: Normal rate and regular rhythm.  Exam reveals no gallop and no friction rub.   No murmur heard. Pulmonary/Chest: Effort normal. She has no wheezes. She has no rales.    Abdominal: Soft. She exhibits no distension and no mass. There is no tenderness. There is no guarding.  Musculoskeletal: She exhibits tenderness. She exhibits no edema.  14cm lac to the right knee just below the patella.  PMS intact distally  Neurological: She is alert and oriented to person, place, and time.  Skin: Skin is warm and dry. She is not diaphoretic.  Psychiatric: She has a normal mood and affect. Her behavior is normal.  Nursing note and vitals reviewed.    ED Treatments / Results  Labs (all labs ordered are listed, but only abnormal results are displayed) Labs Reviewed - No data to display  EKG  EKG Interpretation None       Radiology Ct Head Wo Contrast  Result Date: 01/27/2017 CLINICAL DATA:  Tripped over building blocks and fell onto RIGHT knee, posterior head pain, cervical spine trauma high clinical risk, history COPD, bipolar disorder, substance abuse EXAM: CT HEAD WITHOUT CONTRAST CT CERVICAL SPINE WITHOUT CONTRAST TECHNIQUE: Multidetector CT imaging of the head and cervical spine was performed following the standard protocol without intravenous contrast. Multiplanar CT image reconstructions of the cervical spine were also generated. COMPARISON:  05/31/2015 FINDINGS: CT HEAD FINDINGS Brain: Significant generalized atrophy for age. Normal ventricular morphology. No midline shift or mass effect. Otherwise normal appearance of brain parenchyma. No intracranial hemorrhage, mass lesion, or evidence of acute infarction. No extra-axial fluid collections. Vascular: Atherosclerotic calcification of internal carotid arteries bilaterally at skullbase Skull: Intact Sinuses/Orbits: Mucosal thickening RIGHT maxillary sinus. Remaining visualized paranasal sinuses and mastoid air cells clear. Significant material within the external auditory canals bilaterally question cerumen. Other: N/A CT CERVICAL SPINE FINDINGS Alignment: Retrolisthesis at C5-C6 and C6-C7 not significantly changed.  Minimal anterolisthesis at C4-C5 unchanged. Skull base and vertebrae: Vertebral body heights maintained without fracture or bone destruction. Mild scattered facet degenerative changes. Visualized skullbase intact. Soft tissues and spinal canal: Prevertebral soft tissues normal thickness. Atherosclerotic calcifications especially at the carotid bifurcations. Disc levels: Significant disc space narrowing and endplate spur formation at C5-C6 and C6-C7, less at C4-C5. Uncovertebral spurs encroach upon BILATERAL cervical neural foramina at C5-C6 and C6-C7. Upper chest: Tips of lung apices clear Other: N/A IMPRESSION: Generalized atrophy. No acute intracranial abnormalities. Degenerative disc and facet disease changes of the cervical spine as described above. No acute cervical spine abnormalities. Scattered atherosclerotic calcifications. Electronically Signed   By: Lavonia Dana M.D.   On: 01/27/2017 20:25   Ct Cervical Spine Wo Contrast  Result Date: 01/27/2017 CLINICAL DATA:  Tripped over building blocks and fell onto RIGHT knee, posterior head pain, cervical spine trauma high clinical risk, history COPD, bipolar disorder, substance abuse EXAM: CT HEAD WITHOUT CONTRAST CT CERVICAL SPINE WITHOUT CONTRAST TECHNIQUE: Multidetector CT imaging of the head and cervical spine was performed following the standard protocol without intravenous contrast. Multiplanar CT image reconstructions of the cervical spine were also generated. COMPARISON:  05/31/2015 FINDINGS: CT HEAD FINDINGS Brain: Significant generalized atrophy for age. Normal ventricular morphology. No midline shift or mass effect. Otherwise normal appearance of brain parenchyma. No intracranial hemorrhage, mass lesion, or evidence of acute infarction. No extra-axial fluid collections. Vascular: Atherosclerotic calcification of internal carotid arteries bilaterally at skullbase Skull: Intact Sinuses/Orbits: Mucosal thickening RIGHT maxillary sinus. Remaining  visualized paranasal sinuses and mastoid air cells clear. Significant material within the external auditory canals bilaterally question cerumen. Other: N/A CT CERVICAL SPINE FINDINGS Alignment: Retrolisthesis at C5-C6 and C6-C7 not significantly changed. Minimal anterolisthesis at C4-C5 unchanged. Skull base and vertebrae: Vertebral body heights maintained without fracture or bone destruction. Mild scattered facet degenerative changes. Visualized skullbase intact. Soft tissues and spinal canal: Prevertebral soft tissues normal thickness. Atherosclerotic calcifications especially at the carotid bifurcations. Disc levels: Significant disc space narrowing and endplate spur formation at C5-C6 and C6-C7, less at C4-C5. Uncovertebral spurs encroach upon BILATERAL cervical neural foramina at C5-C6 and C6-C7. Upper chest: Tips of lung apices clear Other: N/A IMPRESSION: Generalized atrophy. No acute intracranial abnormalities. Degenerative disc and facet disease changes of the cervical spine as described above. No acute cervical spine abnormalities. Scattered atherosclerotic calcifications. Electronically Signed   By: Lavonia Dana M.D.   On: 01/27/2017 20:25   Dg Knee Complete 4 Views Right  Result Date: 01/27/2017 CLINICAL DATA:  Fall.  Right knee pain. EXAM: RIGHT KNEE - COMPLETE 4+ VIEW COMPARISON:  None. FINDINGS: No evidence of fracture, dislocation, or joint effusion. No evidence of arthropathy or other focal bone abnormality. Soft tissues are unremarkable. IMPRESSION: No right knee fracture, joint effusion or malalignment. Electronically Signed   By: Ilona Sorrel M.D.   On: 01/27/2017 19:30    Procedures .Marland KitchenLaceration Repair Date/Time: 01/27/2017 9:25 PM Performed by: Tyrone Nine Samanda Buske Authorized by: Deno Etienne   Consent:    Consent obtained:  Verbal   Consent given by:  Patient   Risks discussed:  Infection, pain, poor cosmetic result and poor wound healing   Alternatives discussed:  Delayed treatment and  observation Anesthesia (see MAR for exact dosages):    Anesthesia method:  Local infiltration   Local anesthetic:  Lidocaine 1% WITH epi Laceration details:    Location:  Leg   Leg location:  R knee   Length (cm):  14 Repair type:    Repair type:  Intermediate Pre-procedure details:    Preparation:  Patient was prepped and draped in usual sterile fashion Exploration:    Hemostasis achieved with:  Direct pressure and epinephrine   Wound exploration: wound explored through full range of motion and entire depth of wound probed and visualized     Contaminated: no   Treatment:    Area cleansed with:  Saline   Amount of cleaning:  Extensive   Irrigation solution:  Sterile saline   Irrigation volume:  100   Irrigation method:  Pressure wash Skin repair:    Repair method:  Sutures   Suture size:  3-0   Suture material:  Nylon   Suture technique:  Simple interrupted   Number of sutures:  9 Approximation:    Approximation:  Close Post-procedure details:    Dressing:  Open (no dressing)   Patient tolerance of procedure:  Tolerated well, no immediate complications   (including critical care time)  Medications Ordered in ED Medications  Tdap (BOOSTRIX) injection 0.5 mL (0.5 mLs Intramuscular Given 01/27/17 2020)  lidocaine-EPINEPHrine (XYLOCAINE W/EPI) 2 %-1:200000 (PF) injection 20 mL (20 mLs Intradermal Given 01/27/17 2020)     Initial Impression / Assessment and Plan / ED Course  I have reviewed the triage vital signs and the nursing notes.  Pertinent labs & imaging results that were available during my care of the patient were reviewed by me and considered in my medical decision making (see chart for details).     51 yo F with a chief complaint of a fall. Patient is intoxicated so CT of the head and C-spine were obtained and negative. Laceration to the right knee was repaired at bedside. Tdap Updated. Discharge home.  9:29 PM:  I have discussed the diagnosis/risks/treatment  options with the patient and believe the pt to be eligible for discharge home to follow-up with PCP. We also discussed returning to the ED immediately if new or worsening sx occur. We discussed the sx which are most concerning (e.g., redness, fever, purulent drainage) that necessitate immediate return. Medications administered to the patient during their visit and any new prescriptions provided to the patient are listed below.  Medications given during this visit Medications  Tdap (BOOSTRIX) injection 0.5 mL (0.5 mLs Intramuscular Given 01/27/17 2020)  lidocaine-EPINEPHrine (XYLOCAINE W/EPI) 2 %-1:200000 (PF) injection 20 mL (20 mLs Intradermal Given 01/27/17 2020)     The patient appears reasonably screen and/or stabilized for discharge and I doubt any other medical condition or other [facility] requiring further screening,  evaluation, or treatment in the ED at this time prior to discharge.    Final Clinical Impressions(s) / ED Diagnoses   Final diagnoses:  Fall, initial encounter  Knee laceration, right, initial encounter    New Prescriptions New Prescriptions   No medications on file     Deno Etienne, DO 01/27/17 2129

## 2017-01-27 NOTE — ED Notes (Signed)
Called pt SO, states he is on the way to pick up. Pt placed in waiting room in wheelchair, tech first aware pt needs to be wheeled to car when he arrives.

## 2017-01-27 NOTE — ED Triage Notes (Signed)
Pt in from home via [facility], per report pt fell today landing on cement on R knee, pt has lac with pressure dressing in place upon arrival to ED, bleeding controlled at this time, DP pulse present, pt reported to have used ETOH today, pt A&O x4, pt tearful, pt reports hitting head, C collar in place

## 2017-01-30 ENCOUNTER — Ambulatory Visit: Payer: Self-pay | Admitting: Internal Medicine

## 2017-02-13 ENCOUNTER — Other Ambulatory Visit: Payer: Self-pay | Admitting: Internal Medicine

## 2017-03-17 ENCOUNTER — Other Ambulatory Visit: Payer: Self-pay | Admitting: Internal Medicine

## 2017-03-27 ENCOUNTER — Ambulatory Visit: Payer: Self-pay | Admitting: Internal Medicine

## 2017-10-09 ENCOUNTER — Encounter (HOSPITAL_COMMUNITY): Payer: Self-pay | Admitting: Emergency Medicine

## 2017-10-09 ENCOUNTER — Other Ambulatory Visit: Payer: Self-pay

## 2017-10-09 ENCOUNTER — Emergency Department (HOSPITAL_COMMUNITY): Payer: Medicaid Other

## 2017-10-09 ENCOUNTER — Inpatient Hospital Stay (HOSPITAL_COMMUNITY)
Admission: EM | Admit: 2017-10-09 | Discharge: 2017-10-11 | DRG: 812 | Disposition: A | Discharge status: Home Health | Payer: Medicaid Other | Attending: Family Medicine | Admitting: Family Medicine

## 2017-10-09 DIAGNOSIS — D500 Iron deficiency anemia secondary to blood loss (chronic): Secondary | ICD-10-CM | POA: Diagnosis present

## 2017-10-09 DIAGNOSIS — D649 Anemia, unspecified: Secondary | ICD-10-CM

## 2017-10-09 DIAGNOSIS — F1010 Alcohol abuse, uncomplicated: Secondary | ICD-10-CM

## 2017-10-09 DIAGNOSIS — Z79899 Other long term (current) drug therapy: Secondary | ICD-10-CM

## 2017-10-09 DIAGNOSIS — D509 Iron deficiency anemia, unspecified: Principal | ICD-10-CM | POA: Diagnosis present

## 2017-10-09 DIAGNOSIS — R531 Weakness: Secondary | ICD-10-CM | POA: Diagnosis not present

## 2017-10-09 DIAGNOSIS — K760 Fatty (change of) liver, not elsewhere classified: Secondary | ICD-10-CM | POA: Diagnosis present

## 2017-10-09 DIAGNOSIS — D120 Benign neoplasm of cecum: Secondary | ICD-10-CM | POA: Diagnosis present

## 2017-10-09 DIAGNOSIS — B182 Chronic viral hepatitis C: Secondary | ICD-10-CM | POA: Diagnosis not present

## 2017-10-09 DIAGNOSIS — K640 First degree hemorrhoids: Secondary | ICD-10-CM | POA: Diagnosis present

## 2017-10-09 DIAGNOSIS — E46 Unspecified protein-calorie malnutrition: Secondary | ICD-10-CM | POA: Diagnosis present

## 2017-10-09 DIAGNOSIS — F419 Anxiety disorder, unspecified: Secondary | ICD-10-CM | POA: Diagnosis present

## 2017-10-09 DIAGNOSIS — J449 Chronic obstructive pulmonary disease, unspecified: Secondary | ICD-10-CM | POA: Insufficient documentation

## 2017-10-09 DIAGNOSIS — D123 Benign neoplasm of transverse colon: Secondary | ICD-10-CM | POA: Diagnosis present

## 2017-10-09 DIAGNOSIS — F1020 Alcohol dependence, uncomplicated: Secondary | ICD-10-CM | POA: Diagnosis present

## 2017-10-09 DIAGNOSIS — E440 Moderate protein-calorie malnutrition: Secondary | ICD-10-CM

## 2017-10-09 DIAGNOSIS — Z681 Body mass index (BMI) 19 or less, adult: Secondary | ICD-10-CM

## 2017-10-09 DIAGNOSIS — K5520 Angiodysplasia of colon without hemorrhage: Secondary | ICD-10-CM | POA: Diagnosis present

## 2017-10-09 DIAGNOSIS — R195 Other fecal abnormalities: Secondary | ICD-10-CM

## 2017-10-09 DIAGNOSIS — K2270 Barrett's esophagus without dysplasia: Secondary | ICD-10-CM | POA: Diagnosis present

## 2017-10-09 DIAGNOSIS — Z825 Family history of asthma and other chronic lower respiratory diseases: Secondary | ICD-10-CM

## 2017-10-09 DIAGNOSIS — E876 Hypokalemia: Secondary | ICD-10-CM | POA: Diagnosis present

## 2017-10-09 DIAGNOSIS — D122 Benign neoplasm of ascending colon: Secondary | ICD-10-CM

## 2017-10-09 DIAGNOSIS — F17210 Nicotine dependence, cigarettes, uncomplicated: Secondary | ICD-10-CM | POA: Diagnosis present

## 2017-10-09 DIAGNOSIS — K449 Diaphragmatic hernia without obstruction or gangrene: Secondary | ICD-10-CM | POA: Diagnosis present

## 2017-10-09 DIAGNOSIS — F319 Bipolar disorder, unspecified: Secondary | ICD-10-CM | POA: Diagnosis present

## 2017-10-09 HISTORY — DX: Anemia, unspecified: D64.9

## 2017-10-09 HISTORY — DX: Unspecified viral hepatitis C without hepatic coma: B19.20

## 2017-10-09 HISTORY — DX: Repeated falls: R29.6

## 2017-10-09 LAB — BASIC METABOLIC PANEL
Anion gap: 13 (ref 5–15)
BUN: <5 mg/dL — ABNORMAL LOW (ref 6–20)
CALCIUM: 7.3 mg/dL — AB (ref 8.9–10.3)
CHLORIDE: 101 mmol/L (ref 101–111)
CO2: 22 mmol/L (ref 22–32)
Creatinine, Ser: 0.7 mg/dL (ref 0.44–1.00)
GFR calc Af Amer: >60 mL/min (ref >60)
GFR calc non Af Amer: >60 mL/min (ref >60)
Glucose, Bld: 111 mg/dL — ABNORMAL HIGH (ref 65–99)
POTASSIUM: 3.3 mmol/L — AB (ref 3.5–5.1)
SODIUM: 136 mmol/L (ref 135–145)

## 2017-10-09 LAB — RETICULOCYTES
RBC.: 2.4 MIL/uL — ABNORMAL LOW (ref 3.87–5.11)
RETIC COUNT ABSOLUTE: 57.6 10*3/uL (ref 19.0–186.0)
Retic Ct Pct: 2.4 % (ref 0.4–3.1)

## 2017-10-09 LAB — HEPATIC FUNCTION PANEL
ALK PHOS: 140 U/L — AB (ref 38–126)
ALT: 22 U/L (ref 14–54)
AST: 100 U/L — AB (ref 15–41)
Albumin: 2.3 g/dL — ABNORMAL LOW (ref 3.5–5.0)
BILIRUBIN DIRECT: 0.7 mg/dL — AB (ref 0.1–0.5)
Indirect Bilirubin: 0.9 mg/dL (ref 0.3–0.9)
Total Bilirubin: 1.6 mg/dL — ABNORMAL HIGH (ref 0.3–1.2)
Total Protein: 5.9 g/dL — ABNORMAL LOW (ref 6.5–8.1)

## 2017-10-09 LAB — CBC
HCT: 23.8 % — ABNORMAL LOW (ref 36.0–46.0)
Hemoglobin: 7.5 g/dL — ABNORMAL LOW (ref 12.0–15.0)
MCH: 31.6 pg (ref 26.0–34.0)
MCHC: 31.5 g/dL (ref 30.0–36.0)
MCV: 100.4 fL — ABNORMAL HIGH (ref 78.0–100.0)
PLATELETS: 228 10*3/uL (ref 150–400)
RBC: 2.37 MIL/uL — ABNORMAL LOW (ref 3.87–5.11)
RDW: 17.3 % — AB (ref 11.5–15.5)
WBC: 7.5 10*3/uL (ref 4.0–10.5)

## 2017-10-09 LAB — VITAMIN B12: VITAMIN B 12: 506 pg/mL (ref 180–914)

## 2017-10-09 LAB — ETHANOL: Alcohol, Ethyl (B): <10 mg/dL (ref <10)

## 2017-10-09 LAB — IRON AND TIBC
Iron: 232 ug/dL — ABNORMAL HIGH (ref 28–170)
Saturation Ratios: 92 % — ABNORMAL HIGH (ref 10.4–31.8)
TIBC: 253 ug/dL (ref 250–450)
UIBC: 21 ug/dL

## 2017-10-09 LAB — TSH: TSH: 1.763 u[IU]/mL (ref 0.350–4.500)

## 2017-10-09 LAB — MAGNESIUM: Magnesium: 1.3 mg/dL — ABNORMAL LOW (ref 1.7–2.4)

## 2017-10-09 LAB — FERRITIN: Ferritin: 45 ng/mL (ref 11–307)

## 2017-10-09 LAB — CBG MONITORING, ED: GLUCOSE-CAPILLARY: 109 mg/dL — AB (ref 65–99)

## 2017-10-09 LAB — POC OCCULT BLOOD, ED: Fecal Occult Bld: POSITIVE — AB

## 2017-10-09 LAB — FOLATE: FOLATE: 7.8 ng/mL (ref >5.9)

## 2017-10-09 MED ORDER — IBUPROFEN 600 MG PO TABS
600.0000 mg | ORAL_TABLET | Freq: Four times a day (QID) | ORAL | Status: DC | PRN
  Administered 2017-10-09: 600 mg via ORAL
  Filled 2017-10-09: qty 1

## 2017-10-09 MED ORDER — ADULT MULTIVITAMIN W/MINERALS CH
1.0000 | ORAL_TABLET | Freq: Every day | ORAL | Status: DC
  Administered 2017-10-09 – 2017-10-11 (×3): 1 via ORAL
  Filled 2017-10-09 (×3): qty 1

## 2017-10-09 MED ORDER — NICOTINE 14 MG/24HR TD PT24: 14.0000 mg | MEDICATED_PATCH | Freq: Every day | TRANSDERMAL | Status: DC

## 2017-10-09 MED ORDER — POTASSIUM CHLORIDE CRYS ER 20 MEQ PO TBCR
40.0000 meq | EXTENDED_RELEASE_TABLET | Freq: Once | ORAL | Status: AC
  Administered 2017-10-09: 40 meq via ORAL
  Filled 2017-10-09: qty 2

## 2017-10-09 MED ORDER — LORAZEPAM 2 MG/ML IJ SOLN: 1.0000 mg | Freq: Four times a day (QID) | INTRAMUSCULAR | Status: DC | PRN

## 2017-10-09 MED ORDER — ARFORMOTEROL TARTRATE 15 MCG/2ML IN NEBU
15.0000 ug | INHALATION_SOLUTION | Freq: Two times a day (BID) | RESPIRATORY_TRACT | Status: DC
  Administered 2017-10-09 – 2017-10-11 (×4): 15 ug via RESPIRATORY_TRACT
  Filled 2017-10-09 (×4): qty 2

## 2017-10-09 MED ORDER — LORAZEPAM 1 MG PO TABS
1.0000 mg | ORAL_TABLET | Freq: Four times a day (QID) | ORAL | Status: DC | PRN
  Administered 2017-10-10: 1 mg via ORAL
  Filled 2017-10-09: qty 1

## 2017-10-09 MED ORDER — VITAMIN B-1 100 MG PO TABS
100.0000 mg | ORAL_TABLET | Freq: Every day | ORAL | Status: DC
  Administered 2017-10-09 – 2017-10-11 (×3): 100 mg via ORAL
  Filled 2017-10-09 (×3): qty 1

## 2017-10-09 MED ORDER — POLYETHYLENE GLYCOL 3350 17 G PO PACK: 17.0000 g | PACK | Freq: Every day | ORAL | Status: DC | PRN

## 2017-10-09 MED ORDER — ENOXAPARIN SODIUM 30 MG/0.3ML ~~LOC~~ SOLN
30.0000 mg | SUBCUTANEOUS | Status: DC
  Administered 2017-10-09: 30 mg via SUBCUTANEOUS
  Filled 2017-10-09: qty 0.3

## 2017-10-09 MED ORDER — ENSURE ENLIVE PO LIQD
237.0000 mL | Freq: Two times a day (BID) | ORAL | Status: DC
  Administered 2017-10-10 (×2): 237 mL via ORAL

## 2017-10-09 MED ORDER — MAGNESIUM SULFATE 2 GM/50ML IV SOLN
2.0000 g | Freq: Once | INTRAVENOUS | Status: AC
  Administered 2017-10-09: 2 g via INTRAVENOUS
  Filled 2017-10-09: qty 50

## 2017-10-09 MED ORDER — THIAMINE HCL 100 MG/ML IJ SOLN: 100.0000 mg | Freq: Every day | INTRAMUSCULAR | Status: DC

## 2017-10-09 MED ORDER — SODIUM CHLORIDE 0.9 % IV BOLUS
500.0000 mL | Freq: Once | INTRAVENOUS | Status: AC
  Administered 2017-10-09: 500 mL via INTRAVENOUS

## 2017-10-09 MED ORDER — FOLIC ACID 1 MG PO TABS
1.0000 mg | ORAL_TABLET | Freq: Every day | ORAL | Status: DC
  Administered 2017-10-09 – 2017-10-11 (×3): 1 mg via ORAL
  Filled 2017-10-09 (×3): qty 1

## 2017-10-09 NOTE — ED Notes (Signed)
Attempted to call report

## 2017-10-09 NOTE — ED Notes (Signed)
Patient transported to CT 

## 2017-10-09 NOTE — ED Notes (Signed)
MD informed of pupils being uneven

## 2017-10-09 NOTE — Progress Notes (Signed)
  Pt admitted to the unit. Pt is stable, alert and oriented per baseline. Oriented to room, staff, and call bell. Educated to call for any assistance. Bed in lowest position, call bell within reach- will continue to monitor. 

## 2017-10-09 NOTE — H&P (Signed)
[facility] Admission History and Physical Service Pager: [phone]  Patient name: Debbie [facility] Medical record number: [id number] Date of birth: 12-07-1965 Age: 52 y.o. Gender: female  Primary Care Provider: Patient, No Pcp Per Consultants: none Code Status: Full  Chief Complaint: weakness  Assessment and Plan: Debbie [facility] is a 52 y.o. female presenting with weakness. PMH is significant for chronic untreated hepatitis C, alcohol abuse, COPD, history of polysubstance abuse.  Weakness. Likely due to symptomatic anemia. Hgb 7.5 from baseline 12.0 and positive FOBT.  Per chart review she has a history of microcytic anemia however MCV 100.4 today.  She does also have a history of positive FOBT but has never gotten this worked up as an outpatient. -Placed in observation, attending Dr. Nori Riis -Monitor on telemetry -Anemia panel, TSH -Monitor CBC -GI consult in the morning  Alcohol abuse with a history of withdrawal.  History of polysubstance abuse.  Last drink per patient was either on 6/14 or 6/15.  Reports remote history of marijuana use only. -Monitor on CIWA protocol -Folic acid, thiamine, multivitamin -UDS pending  Mild transaminitis.  History of untreated chronic hepatitis C.  AST 100 ALT 22\ -Hepatitis panel -Monitor LFTs  Hypokalemia, hypomagnesia.  K3.3, mag 1.3 - s/p KDur 40 mEq x1 and mag 2g x1 - monitor electrolytes   FEN/GI: heart healthy Prophylaxis: lovenox  Disposition: place in observation  History of Present Illness:  Debbie [facility] is a 52 y.o. female presenting with weakness.  Patient states that she has felt weak over the last 2 weeks especially noticed when she gets up from a sitting position she will sometimes fall and have to crawl to get back up.  This is acutely worsened over the last 2 days where she has felt so weak that she has not wanted to get up to feed herself or do anything, does endorse some feelings of  depression.  Also endorses some lightheadedness and dizziness.  Has had very little to eat or drink over the last couple of days.  She did fall but landed on her knees, did not hit her head.  No blacking out, or near blacking out.  No headaches, vision changes, chest pain, shortness of breath, abdominal pain, nausea, vomiting, diarrhea, constipation, hematochezia, melena.  Review Of Systems: Per HPI with the following additions:   Review of Systems  Constitutional: Positive for malaise/fatigue. Negative for chills, diaphoresis and fever.  Respiratory: Negative for shortness of breath and wheezing.   Cardiovascular: Negative for chest pain and palpitations.  Gastrointestinal: Negative for abdominal pain, blood in stool, constipation, diarrhea, heartburn, melena, nausea and vomiting.  Genitourinary: Negative for dysuria, frequency and urgency.  Neurological: Positive for dizziness and weakness.    Patient Active Problem List   Diagnosis Date Noted  . 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 Hepatic steatosis 09/30/2015  . Anemia, iron deficiency   . Chronic hepatitis C without hepatic coma ([place])   . Alcoholic liver disease ([place])   . Protein-calorie malnutrition, severe 09/29/2015  . Alcohol withdrawal ([place]) 09/28/2015  . Normocytic anemia 07/12/2015  . Leg wound, left 07/10/2015  . Alcoholic ([place]) 27/78/2423  . Heavy cigarette smoker (20-39 per day) 07/10/2015    Past Medical History: Past Medical History:  Diagnosis Date  . Alcoholism ([place]) 2000   chronic  . Bipolar 1 disorder ([place]) 2010   with depression and anxiety  . COPD (chronic obstructive pulmonary disease) ([place])   .  Hepatic steatosis 09/5033   alcoholic fatty liver.  Metavir fibrosis score F 2 with some F3  . Hepatitis C antibody test positive 09/2015   HCV quant: no virus detected.   . Polysubstance abuse ([place]) 2000  . Protein-calorie malnutrition ([place]) 2012    Past Surgical  History: Past Surgical History:  Procedure Laterality Date  . excision nasal sebaceous cyst  06/2003  . hallux laceration, repair distal phalanx fracture Left 01/2011   lawn mower injury.    Social History: Social History   Tobacco Use  . Smoking status: Current Every Day Smoker    Packs/day: 1.00    Years: 37.00    Pack years: 37.00    Types: Cigarettes  . Smokeless tobacco: Never Used  . Tobacco comment: 2-3 packs/day since age 58  Substance Use Topics  . Alcohol use: Not Currently    Alcohol/week: 0.0 oz    Comment: "2 40 oz beers per day  . Drug use: No   Additional social history: last drink either 6/14 or 6/15, used THC in the past. Lives at home with boyfrend. Please also refer to relevant sections of EMR.  Family History: History reviewed. No pertinent family history. No family hx  Father COPD.  Allergies and Medications: No Known Allergies No current facility-administered medications on file prior to encounter.    Current Outpatient Medications on File Prior to Encounter  Medication Sig Dispense Refill  . acetaminophen (TYLENOL) 500 MG tablet Take 2 tablets (1,000 mg total) by mouth every 6 (six) hours as needed for mild pain. (Patient not taking: Reported on 10/09/2017) 30 tablet 0  . ferrous sulfate 325 (65 FE) MG tablet Take 1 tablet (325 mg total) by mouth daily with breakfast. (Patient not taking: Reported on 12/09/2016) 30 tablet 3  . folic acid (FOLVITE) 1 MG tablet Take 1 tablet (1 mg total) by mouth daily. (Patient not taking: Reported on 12/09/2016) 30 tablet 11  . Multiple Vitamin (MULTIVITAMIN WITH MINERALS) TABS tablet Take 1 tablet by mouth daily. (Patient not taking: Reported on 10/09/2017) 30 tablet 1  . nicotine (NICODERM CQ - DOSED IN MG/24 HOURS) 21 mg/24hr patch Place 1 patch (21 mg total) onto the skin daily. (Patient not taking: Reported on 12/09/2016) 28 patch 0  . thiamine 100 MG tablet Take 1 tablet (100 mg total) by mouth daily. (Patient not  taking: Reported on 10/09/2017) 30 tablet 1  . Tiotropium Bromide-Olodaterol (STIOLTO RESPIMAT) 2.5-2.5 MCG/ACT AERS Inhale 1 Inhaler into the lungs daily.    . vitamin B-12 100 MCG tablet Take 1 tablet (100 mcg total) by mouth daily. (Patient not taking: Reported on 12/09/2016) 30 tablet 1    Objective: BP 114/73   Pulse 95   Temp 99.1 F (37.3 C)   Resp 16   Ht 5\' 7"  (1.702 m)   Wt 43.8 kg (96 lb 9.6 oz)   SpO2 100%   BMI 15.13 kg/m  Exam: General: Laying in bed, in no acute distress Eyes: Pupils reactive bilaterally, extraocular movements intact ENTM: Moist mucous membranes, oropharynx nonerythematous Neck: Supple, no JVD Cardiovascular: Regular rate and rhythm, normal S1 and S2, no murmurs Respiratory: Clear to auscultation bilaterally, normal effort on room air Gastrointestinal: Soft, nontender, nondistended, positive bowel sounds MSK: Moving all limbs equally, no lower extremity edema Derm: Warm and dry, no rashes Neuro: Alert and awake, oriented, grossly normal Psych: Flat affect  Labs and Imaging: CBC BMET  Recent Labs  Lab 10/09/17 1242  WBC 7.5  HGB  7.5*  HCT 23.8*  PLT 228   Recent Labs  Lab 10/09/17 1242  NA 136  K 3.3*  CL 101  CO2 22  BUN <5*  CREATININE 0.70  GLUCOSE 111*  CALCIUM 7.3*     Magnesium 1.3  Ethanol<10  FOBT positive   Dg Chest 2 View  Result Date: 10/09/2017 CLINICAL DATA:  Cough and weakness for the past 3 days. EXAM: CHEST - 2 VIEW COMPARISON:  CT chest and chest x-ray dated May 31, 2015. FINDINGS: The heart size and mediastinal contours are within normal limits. Both lungs are clear. The visualized skeletal structures are unremarkable. IMPRESSION: No active cardiopulmonary disease. Electronically Signed   By: Titus Dubin M.D.   On: 10/09/2017 15:53   Ct Head Wo Contrast  Result Date: 10/09/2017 CLINICAL DATA:  52 y/o  F; 3 days of weakness.  Fall this morning. EXAM: CT HEAD WITHOUT CONTRAST TECHNIQUE: Contiguous  axial images were obtained from the base of the skull through the vertex without intravenous contrast. COMPARISON:  01/27/2017 CT head. FINDINGS: Brain: No evidence of acute infarction, hemorrhage, hydrocephalus, extra-axial collection or mass lesion/mass effect. Stable brain parenchymal volume loss. Vascular: Calcific atherosclerosis of the carotid siphons. Skull: Normal. Negative for fracture or focal lesion. Sinuses/Orbits: Moderate right maxillary sinus mucosal thickening with aerosolized secretions. Chronic inflammatory changes of the wall of the sinus. Paranasal sinuses and mastoid air cells are otherwise normally aerated. Partial opacification of the right external auditory canal, likely cerumen. Orbits are unremarkable. Other: None. IMPRESSION: 1. No acute intracranial abnormality. 2. Stable brain parenchymal volume loss. 3. Stable chronic right maxillary sinusitis with aerosolized secretions which may represent a component of acute sinusitis. Electronically Signed   By: Kristine Garbe M.D.   On: 10/09/2017 16:14    Bufford Lope, DO 10/09/2017, 5:59 PM PGY-2, [facility] pager: [phone], text pages welcome

## 2017-10-09 NOTE — ED Notes (Signed)
Pt was observed to have no difficulty swallowing

## 2017-10-09 NOTE — ED Notes (Signed)
Pt put on [place] at 17:18.

## 2017-10-09 NOTE — ED Triage Notes (Addendum)
Per [facility], Pt reports weakness starting 3 days ago. Pt had a fall this morning when trying to get up off toilet, reports her legs gave out on her, she landed on her knees. Pt denies hitting her head or loss of consciousness. Pt alert and oriented x4. Pt complaining of bilateral ankle pain. Pt reports loss of appetite, reports when she tries to eat she feels nauseous.

## 2017-10-09 NOTE — ED Notes (Signed)
Pt informed she needs to provide a UA when possible. Pt verbalized understanding and stated she'd be able to ambulate with some assistance to the restroom.

## 2017-10-09 NOTE — ED Notes (Signed)
Bladder scan showed 76 ml urine in bladder

## 2017-10-09 NOTE — ED Provider Notes (Addendum)
[facility] Provider Note   CSN: [id number] Arrival date & time: 10/09/17  1208     History   Chief Complaint Chief Complaint  Patient presents with  . Weakness    HPI Debbie [facility]imes is a 52 y.o. female PMH of alcoholism, substance abuse, bipolar 1, COPD, hep C, protein calorie malnutrition here for weakness.   Patient notes that over the last 2 weeks she has had an increased amount of falls and has felt generally more weak.  She notes that the times when she falls are usually when she is sitting on the toilet urinating.  Does not pass out after having a bowel movement.  She notes that she has fallen about 3 times this week, landing on her knees never hitting her head.  At baseline she walks with a cane and has done this for the last 2 years.  Denies any abrupt loss of consciousness or blacking out.  Admits to sometimes feeling dizzy though.  Admits to decreased appetite and has not eaten anything solid in the last 2 days.  She has been drinking just water and tea.  She does drink alcohol, her last drink was yesterday 116 ounce bottle of beer.  She denies any chest pain shortness of breath, fever, dysuria, emesis, diarrhea, constipation, hematochezia.  Does admit to a little nausea and some mild generalized abdominal pain.    HPI  Past Medical History:  Diagnosis Date  . Alcoholism ([place]) 2000   chronic  . Bipolar 1 disorder ([place]) 2010   with depression and anxiety  . COPD (chronic obstructive pulmonary disease) ([place])   . Hepatic steatosis 0/2637   alcoholic fatty liver.  Metavir fibrosis score F 2 with some F3  . Hepatitis C antibody test positive 09/2015   HCV quant: no virus detected.   . Polysubstance abuse ([place]) 2000  . Protein-calorie malnutrition ([place]) 2012    Patient Active Problem List   Diagnosis Date Noted  . 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 Hepatic steatosis 09/30/2015  .  Anemia, iron deficiency   . Chronic hepatitis C without hepatic coma ([place])   . Alcoholic liver disease ([place])   . Protein-calorie malnutrition, severe 09/29/2015  . Alcohol withdrawal ([place]) 09/28/2015  . Normocytic anemia 07/12/2015  . Leg wound, left 07/10/2015  . Alcoholic ([place]) 85/88/5027  . Heavy cigarette smoker (20-39 per day) 07/10/2015    Past Surgical History:  Procedure Laterality Date  . excision nasal sebaceous cyst  06/2003  . hallux laceration, repair distal phalanx fracture Left 01/2011   lawn mower injury.     OB History    Gravida  2   Para  2   Term  0   Preterm  0   AB  0   Living        SAB  0   TAB  0   Ectopic  0   Multiple      Live Births               Home Medications    Prior to Admission medications   Medication Sig Start Date End Date Taking? Authorizing Provider  acetaminophen (TYLENOL) 500 MG tablet Take 2 tablets (1,000 mg total) by mouth every 6 (six) hours as needed for mild pain. Patient not taking: Reported on 10/09/2017 12/09/16   Antonietta Breach, PA-C  ferrous sulfate 325 (65 FE) MG tablet Take 1 tablet (325 mg  total) by mouth daily with breakfast. Patient not taking: Reported on 12/09/2016 10/02/15   Zada Finders, MD  folic acid (FOLVITE) 1 MG tablet Take 1 tablet (1 mg total) by mouth daily. Patient not taking: Reported on 12/09/2016 07/12/15   Boykin Nearing, MD  Multiple Vitamin (MULTIVITAMIN WITH MINERALS) TABS tablet Take 1 tablet by mouth daily. Patient not taking: Reported on 10/09/2017 10/02/15   Zada Finders, MD  nicotine (NICODERM CQ - DOSED IN MG/24 HOURS) 21 mg/24hr patch Place 1 patch (21 mg total) onto the skin daily. Patient not taking: Reported on 12/09/2016 10/02/15   Zada Finders, MD  thiamine 100 MG tablet Take 1 tablet (100 mg total) by mouth daily. Patient not taking: Reported on 10/09/2017 10/02/15   Zada Finders, MD  Tiotropium Bromide-Olodaterol (STIOLTO RESPIMAT) 2.5-2.5 MCG/ACT AERS Inhale 1 Inhaler into  the lungs daily.    [provider]  vitamin B-12 100 MCG tablet Take 1 tablet (100 mcg total) by mouth daily. Patient not taking: Reported on 12/09/2016 10/02/15   Zada Finders, MD    Family History History reviewed. No pertinent family history.  Social History Social History   Tobacco Use  . Smoking status: Current Every Day Smoker    Packs/day: 1.00    Years: 37.00    Pack years: 37.00    Types: Cigarettes  . Smokeless tobacco: Never Used  . Tobacco comment: 2-3 packs/day since age 39  Substance Use Topics  . Alcohol use: Not Currently    Alcohol/week: 0.0 oz    Comment: "2 40 oz beers per day  . Drug use: No     Allergies   Patient has no known allergies.   Review of Systems Review of Systems  Constitutional: Positive for appetite change (decreased). Negative for fever.  Respiratory: Negative for cough and shortness of breath.   Cardiovascular: Negative for chest pain.  Gastrointestinal: Negative for abdominal pain, blood in stool, constipation, diarrhea, nausea and vomiting.  Neurological: Positive for dizziness, tremors and weakness. Negative for seizures and syncope.  All other systems reviewed and are negative.   Physical Exam Updated Vital Signs BP 114/73   Pulse 95   Temp 99.1 F (37.3 C)   Resp 16   Ht 5\' 7"  (1.702 m)   Wt 43.8 kg (96 lb 9.6 oz)   SpO2 100%   BMI 15.13 kg/m   Physical Exam  Constitutional: She is oriented to person, place, and time. She appears well-developed.  Cachectic and pale appearing. Appears older than stated age  HENT:  Head: Normocephalic.  Right Ear: External ear normal.  Left Ear: External ear normal.  Dry mucous membranes  Eyes: Pupils are equal, round, and reactive to light. Conjunctivae are normal. No scleral icterus.  Neck: Normal range of motion.  Cardiovascular: Regular rhythm. Tachycardia present.  No murmur heard. Pulmonary/Chest: Effort normal and breath sounds normal. No respiratory distress. She  has no wheezes.  Abdominal: Soft. Bowel sounds are normal. She exhibits no distension. There is no tenderness. There is no guarding.  Musculoskeletal: She exhibits no edema or tenderness.  4 out of 5 strength throughout all extremities  Neurological: She is alert and oriented to person, place, and time. No cranial nerve deficit or sensory deficit. She exhibits normal muscle tone.  Resting hand tremor bilaterally  Skin: Skin is warm. Capillary refill takes less than 2 seconds. There is pallor.  Multiple areas of broken skin various stages of healing throughout extremities  Psychiatric: She has a normal mood  and affect. Her behavior is normal. Thought content normal.    ED Treatments / Results  Labs (all labs ordered are listed, but only abnormal results are displayed) Labs Reviewed  BASIC METABOLIC PANEL - Abnormal; Notable for the following components:      Result Value   Potassium 3.3 (*)    Glucose, Bld 111 (*)    BUN <5 (*)    Calcium 7.3 (*)    All other components within normal limits  CBC - Abnormal; Notable for the following components:   RBC 2.37 (*)    Hemoglobin 7.5 (*)    HCT 23.8 (*)    MCV 100.4 (*)    RDW 17.3 (*)    All other components within normal limits  HEPATIC FUNCTION PANEL - Abnormal; Notable for the following components:   Total Protein 5.9 (*)    Albumin 2.3 (*)    AST 100 (*)    Alkaline Phosphatase 140 (*)    Total Bilirubin 1.6 (*)    Bilirubin, Direct 0.7 (*)    All other components within normal limits  CBG MONITORING, ED - Abnormal; Notable for the following components:   Glucose-Capillary 109 (*)    All other components within normal limits  POC OCCULT BLOOD, ED - Abnormal; Notable for the following components:   Fecal Occult Bld POSITIVE (*)    All other components within normal limits  ETHANOL  URINALYSIS, ROUTINE W REFLEX MICROSCOPIC  RAPID URINE DRUG SCREEN, HOSP PERFORMED  OCCULT BLOOD X 1 CARD TO LAB, STOOL  MAGNESIUM  TYPE AND  SCREEN    EKG EKG Interpretation  Date/Time:  Monday October 09 2017 12:43:07 EDT Ventricular Rate:  103 PR Interval:    QRS Duration: 99 QT Interval:  362 QTC Calculation: 474 R Axis:   60 Text Interpretation:  Sinus tachycardia Confirmed by Quintella Reichert [phone]) on 10/09/2017 12:57:35 PM   Radiology Dg Chest 2 View  Result Date: 10/09/2017 CLINICAL DATA:  Cough and weakness for the past 3 days. EXAM: CHEST - 2 VIEW COMPARISON:  CT chest and chest x-ray dated May 31, 2015. FINDINGS: The heart size and mediastinal contours are within normal limits. Both lungs are clear. The visualized skeletal structures are unremarkable. IMPRESSION: No active cardiopulmonary disease. Electronically Signed   By: Titus Dubin M.D.   On: 10/09/2017 15:53   Ct Head Wo Contrast  Result Date: 10/09/2017 CLINICAL DATA:  52 y/o  F; 3 days of weakness.  Fall this morning. EXAM: CT HEAD WITHOUT CONTRAST TECHNIQUE: Contiguous axial images were obtained from the base of the skull through the vertex without intravenous contrast. COMPARISON:  01/27/2017 CT head. FINDINGS: Brain: No evidence of acute infarction, hemorrhage, hydrocephalus, extra-axial collection or mass lesion/mass effect. Stable brain parenchymal volume loss. Vascular: Calcific atherosclerosis of the carotid siphons. Skull: Normal. Negative for fracture or focal lesion. Sinuses/Orbits: Moderate right maxillary sinus mucosal thickening with aerosolized secretions. Chronic inflammatory changes of the wall of the sinus. Paranasal sinuses and mastoid air cells are otherwise normally aerated. Partial opacification of the right external auditory canal, likely cerumen. Orbits are unremarkable. Other: None. IMPRESSION: 1. No acute intracranial abnormality. 2. Stable brain parenchymal volume loss. 3. Stable chronic right maxillary sinusitis with aerosolized secretions which may represent a component of acute sinusitis. Electronically Signed   By: Kristine Garbe M.D.   On: 10/09/2017 16:14    Procedures Procedures (including critical care time)  Medications Ordered in ED Medications  sodium chloride 0.9 % bolus  500 mL (500 mLs Intravenous New Bag/Given 10/09/17 1423)     Initial Impression / Assessment and Plan / ED Course  I have reviewed the triage vital signs and the nursing notes.  Pertinent labs & imaging results that were available during my care of the patient were reviewed by me and considered in my medical decision making (see chart for details).    52 year old with PMH of alcoholism, substance abuse bipolar 1, COPD, hep C, protein calorie malnutrition here for weakness and increased falls on her knees from getting up from the toilet for the last couple weeks.   On exam she appears cachectic and mildly dehydrated. Normotensive.  Tachycardic to 106.  She has generalized weakness, 4/5 strength in all extremities and resting hand tremor.  Her hemoglobin is low to 7.5.  Other remarkable labs include potassium 3.3, calcium of 7.3 (corrected to 8.7 for hypoalbuminemia), AST of 100, T bili 1.6.  EKG showing sinus tachycardia.   500 cc of normal saline given, tachycardia improved with a heart rate into the 90s.   Possible uneven pupils seen on exam, CT Head ordered which was unremarkable.   Unclear etiology of anemia, possible B12 or folate deficiency as MCV is 100.4 and history of alcohol abuse.  Rectal exam not showing any gross blood. FOBT is positive. Hgb not quit at transfusion threshold, will hold off on transfusion at this time but will type and screen.  Discussed admission with [facility] resident Dr. Shawna Orleans for symptomatic anemia. Appreciative of her assistance.   Final Clinical Impressions(s) / ED Diagnoses   Final diagnoses:  Weakness  Symptomatic anemia    ED Discharge Orders    None       Carlyle Dolly, MD 10/09/17 1800    Carlyle Dolly, MD 10/09/17 Tresa Moore    Quintella Reichert, MD 10/11/17  1430    Quintella Reichert, MD 10/24/17 [phone]

## 2017-10-10 ENCOUNTER — Encounter (HOSPITAL_COMMUNITY): Payer: Self-pay | Admitting: Physician Assistant

## 2017-10-10 DIAGNOSIS — F17210 Nicotine dependence, cigarettes, uncomplicated: Secondary | ICD-10-CM | POA: Diagnosis present

## 2017-10-10 DIAGNOSIS — R195 Other fecal abnormalities: Secondary | ICD-10-CM

## 2017-10-10 DIAGNOSIS — D649 Anemia, unspecified: Secondary | ICD-10-CM | POA: Diagnosis not present

## 2017-10-10 DIAGNOSIS — R531 Weakness: Secondary | ICD-10-CM | POA: Diagnosis present

## 2017-10-10 DIAGNOSIS — D509 Iron deficiency anemia, unspecified: Secondary | ICD-10-CM | POA: Diagnosis present

## 2017-10-10 DIAGNOSIS — D122 Benign neoplasm of ascending colon: Secondary | ICD-10-CM | POA: Diagnosis present

## 2017-10-10 DIAGNOSIS — K2270 Barrett's esophagus without dysplasia: Secondary | ICD-10-CM | POA: Diagnosis present

## 2017-10-10 DIAGNOSIS — D500 Iron deficiency anemia secondary to blood loss (chronic): Secondary | ICD-10-CM | POA: Diagnosis not present

## 2017-10-10 DIAGNOSIS — E440 Moderate protein-calorie malnutrition: Secondary | ICD-10-CM

## 2017-10-10 DIAGNOSIS — F1020 Alcohol dependence, uncomplicated: Secondary | ICD-10-CM | POA: Diagnosis present

## 2017-10-10 DIAGNOSIS — E876 Hypokalemia: Secondary | ICD-10-CM | POA: Diagnosis present

## 2017-10-10 DIAGNOSIS — D120 Benign neoplasm of cecum: Secondary | ICD-10-CM | POA: Diagnosis present

## 2017-10-10 DIAGNOSIS — F319 Bipolar disorder, unspecified: Secondary | ICD-10-CM | POA: Diagnosis present

## 2017-10-10 DIAGNOSIS — Z79899 Other long term (current) drug therapy: Secondary | ICD-10-CM | POA: Diagnosis not present

## 2017-10-10 DIAGNOSIS — K228 Other specified diseases of esophagus: Secondary | ICD-10-CM | POA: Diagnosis not present

## 2017-10-10 DIAGNOSIS — Z681 Body mass index (BMI) 19 or less, adult: Secondary | ICD-10-CM | POA: Diagnosis not present

## 2017-10-10 DIAGNOSIS — F419 Anxiety disorder, unspecified: Secondary | ICD-10-CM | POA: Diagnosis present

## 2017-10-10 DIAGNOSIS — Z825 Family history of asthma and other chronic lower respiratory diseases: Secondary | ICD-10-CM | POA: Diagnosis not present

## 2017-10-10 DIAGNOSIS — D123 Benign neoplasm of transverse colon: Secondary | ICD-10-CM | POA: Diagnosis present

## 2017-10-10 DIAGNOSIS — J449 Chronic obstructive pulmonary disease, unspecified: Secondary | ICD-10-CM | POA: Diagnosis present

## 2017-10-10 DIAGNOSIS — B182 Chronic viral hepatitis C: Secondary | ICD-10-CM | POA: Diagnosis present

## 2017-10-10 DIAGNOSIS — K449 Diaphragmatic hernia without obstruction or gangrene: Secondary | ICD-10-CM | POA: Diagnosis not present

## 2017-10-10 DIAGNOSIS — K5520 Angiodysplasia of colon without hemorrhage: Secondary | ICD-10-CM | POA: Diagnosis present

## 2017-10-10 DIAGNOSIS — K760 Fatty (change of) liver, not elsewhere classified: Secondary | ICD-10-CM | POA: Diagnosis present

## 2017-10-10 DIAGNOSIS — K640 First degree hemorrhoids: Secondary | ICD-10-CM | POA: Diagnosis present

## 2017-10-10 DIAGNOSIS — E46 Unspecified protein-calorie malnutrition: Secondary | ICD-10-CM | POA: Diagnosis present

## 2017-10-10 LAB — PHOSPHORUS: Phosphorus: 1.7 mg/dL — ABNORMAL LOW (ref 2.5–4.6)

## 2017-10-10 LAB — CBC WITH DIFFERENTIAL/PLATELET
ABS IMMATURE GRANULOCYTES: 0.1 10*3/uL (ref 0.0–0.1)
BASOS ABS: 0 10*3/uL (ref 0.0–0.1)
Basophils Relative: 0 %
Eosinophils Absolute: 0 10*3/uL (ref 0.0–0.7)
Eosinophils Relative: 0 %
HCT: 20.7 % — ABNORMAL LOW (ref 36.0–46.0)
HEMOGLOBIN: 6.5 g/dL — AB (ref 12.0–15.0)
IMMATURE GRANULOCYTES: 1 %
LYMPHS PCT: 13 %
Lymphs Abs: 1 10*3/uL (ref 0.7–4.0)
MCH: 31.6 pg (ref 26.0–34.0)
MCHC: 31.4 g/dL (ref 30.0–36.0)
MCV: 100.5 fL — ABNORMAL HIGH (ref 78.0–100.0)
MONO ABS: 0.7 10*3/uL (ref 0.1–1.0)
Monocytes Relative: 9 %
NEUTROS ABS: 6.3 10*3/uL (ref 1.7–7.7)
NEUTROS PCT: 77 %
PLATELETS: 183 10*3/uL (ref 150–400)
RBC: 2.06 MIL/uL — ABNORMAL LOW (ref 3.87–5.11)
RDW: 17.1 % — ABNORMAL HIGH (ref 11.5–15.5)
WBC: 8.1 10*3/uL (ref 4.0–10.5)

## 2017-10-10 LAB — COMPREHENSIVE METABOLIC PANEL
ALBUMIN: 2 g/dL — AB (ref 3.5–5.0)
ALT: 22 U/L (ref 14–54)
ANION GAP: 6 (ref 5–15)
AST: 103 U/L — ABNORMAL HIGH (ref 15–41)
Alkaline Phosphatase: 133 U/L — ABNORMAL HIGH (ref 38–126)
BUN: <5 mg/dL — ABNORMAL LOW (ref 6–20)
CO2: 26 mmol/L (ref 22–32)
Calcium: 7.7 mg/dL — ABNORMAL LOW (ref 8.9–10.3)
Chloride: 103 mmol/L (ref 101–111)
Creatinine, Ser: 0.58 mg/dL (ref 0.44–1.00)
GFR calc non Af Amer: >60 mL/min (ref >60)
Glucose, Bld: 120 mg/dL — ABNORMAL HIGH (ref 65–99)
POTASSIUM: 4.1 mmol/L (ref 3.5–5.1)
SODIUM: 135 mmol/L (ref 135–145)
Total Bilirubin: 1.6 mg/dL — ABNORMAL HIGH (ref 0.3–1.2)
Total Protein: 5.1 g/dL — ABNORMAL LOW (ref 6.5–8.1)

## 2017-10-10 LAB — HIV ANTIBODY (ROUTINE TESTING W REFLEX): HIV SCREEN 4TH GENERATION: NONREACTIVE

## 2017-10-10 LAB — PREPARE RBC (CROSSMATCH)

## 2017-10-10 LAB — MAGNESIUM: Magnesium: 2.2 mg/dL (ref 1.7–2.4)

## 2017-10-10 LAB — CK: CK TOTAL: 22 U/L — AB (ref 38–234)

## 2017-10-10 MED ORDER — NICOTINE 14 MG/24HR TD PT24
14.0000 mg | MEDICATED_PATCH | Freq: Every day | TRANSDERMAL | Status: DC
  Filled 2017-10-10 (×2): qty 1

## 2017-10-10 MED ORDER — SODIUM CHLORIDE 0.9 % IV SOLN: Freq: Once | INTRAVENOUS | Status: DC

## 2017-10-10 MED ORDER — PEG-KCL-NACL-NASULF-NA ASC-C 100 G PO SOLR: 1.0000 | Freq: Once | ORAL | Status: DC

## 2017-10-10 MED ORDER — MAGNESIUM SULFATE 2 GM/50ML IV SOLN: 2.0000 g | Freq: Every day | INTRAVENOUS | Status: DC

## 2017-10-10 MED ORDER — BISACODYL 5 MG PO TBEC
5.0000 mg | DELAYED_RELEASE_TABLET | Freq: Once | ORAL | Status: AC
  Administered 2017-10-10: 5 mg via ORAL
  Filled 2017-10-10: qty 1

## 2017-10-10 MED ORDER — SODIUM CHLORIDE 0.9 % IV BOLUS
1000.0000 mL | Freq: Once | INTRAVENOUS | Status: AC
  Administered 2017-10-10: 1000 mL via INTRAVENOUS

## 2017-10-10 MED ORDER — SODIUM CHLORIDE 0.9 % IV SOLN
INTRAVENOUS | Status: DC
  Administered 2017-10-10 – 2017-10-11 (×3): via INTRAVENOUS

## 2017-10-10 MED ORDER — PEG-KCL-NACL-NASULF-NA ASC-C 100 G PO SOLR
0.5000 | Freq: Once | ORAL | Status: AC
  Administered 2017-10-10: 100 g via ORAL
  Filled 2017-10-10 (×2): qty 1

## 2017-10-10 MED ORDER — PANTOPRAZOLE SODIUM 40 MG IV SOLR
40.0000 mg | Freq: Two times a day (BID) | INTRAVENOUS | Status: DC
  Filled 2017-10-10: qty 40

## 2017-10-10 MED ORDER — PEG-KCL-NACL-NASULF-NA ASC-C 100 G PO SOLR
0.5000 | Freq: Once | ORAL | Status: AC
  Administered 2017-10-11: 100 g via ORAL

## 2017-10-10 MED ORDER — ACETAMINOPHEN 325 MG PO TABS
650.0000 mg | ORAL_TABLET | Freq: Four times a day (QID) | ORAL | Status: DC | PRN
  Administered 2017-10-10: 650 mg via ORAL
  Filled 2017-10-10: qty 2

## 2017-10-10 MED ORDER — PANTOPRAZOLE SODIUM 40 MG PO TBEC
40.0000 mg | DELAYED_RELEASE_TABLET | Freq: Two times a day (BID) | ORAL | Status: DC
  Administered 2017-10-10 (×2): 40 mg via ORAL
  Filled 2017-10-10: qty 1

## 2017-10-10 MED ORDER — METOCLOPRAMIDE HCL 10 MG PO TABS
10.0000 mg | ORAL_TABLET | Freq: Once | ORAL | Status: AC
Start: 2017-10-11 — End: 2017-10-11
  Administered 2017-10-11: 10 mg via ORAL
  Filled 2017-10-10: qty 1

## 2017-10-10 MED ORDER — BOOST / RESOURCE BREEZE PO LIQD CUSTOM
1.0000 | Freq: Three times a day (TID) | ORAL | Status: DC
  Administered 2017-10-10 – 2017-10-11 (×2): 1 via ORAL

## 2017-10-10 MED ORDER — METOCLOPRAMIDE HCL 10 MG PO TABS
10.0000 mg | ORAL_TABLET | Freq: Once | ORAL | Status: AC
Start: 2017-10-10 — End: 2017-10-10
  Administered 2017-10-10: 10 mg via ORAL
  Filled 2017-10-10: qty 1

## 2017-10-10 NOTE — Progress Notes (Signed)
Pt. Is refusing to sign Blood Consent right now.  She wants to speak to a provider before signing the consent.Marland Kitchen
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
## 2017-10-10 NOTE — Progress Notes (Signed)
Pt c/o abdominal pain 8/10, described as aching with periodic sharp pain. Pt requested nicotine patch. MD aware.

## 2017-10-10 NOTE — Progress Notes (Signed)
PT Cancellation Note  Patient Details Name: Debbie Grimes MRN: [id number] DOB: 1965/11/10   Cancelled Treatment:    Reason Eval/Treat Not Completed: Patient not medically ready. Pt with Hgb 6.5, will begin PT services when medically appropriate   Mabell Esguerra 10/10/2017, 8:24 AM
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
## 2017-10-10 NOTE — Progress Notes (Addendum)
Family Medicine Teaching Service Daily Progress Note Intern Pager: [phone]  Patient name: Debbie Grimes Medical record number: [id number] Date of birth: [birth year] Age: 52 y.o. Gender: female  Primary Care Provider: Patient, No Pcp Per Consultants: GI Code Status: full  Pt Overview and Major Events to Date:  Admitted 6/17 Blood transfusion 6/18  Assessment and Plan:  Debbie Grimes is a 52 y.o. female presenting with weakness. PMH is significant for chronic untreated hepatitis C, alcohol abuse, COPD, history of polysubstance abuse.  Symptomatic anemia, acute, worsening. Hgb 7.5 from baseline 12.0 and positive FOBT, Hgb 6.5 on 6/18.  MCV 100.4 on admission, folate and Vitamin B12 wnl.  Could still have Vitamin B12 deficiency since test for B12 not as specific as methylmalonic acid.  Normal reticulocyte count while anemic suggests bone marrow suppression.  Elevated RDW could indicate presence of different ages of RBCs.  Ferritin wnl at 45, Iron elevated at 232, TIBC normal.  Could be anemia of chronic disease given Hepatitis C.  She does also have a history of positive FOBT but has never gotten this worked up as an outpatient.  TSH wnl at 1.763. -Monitor on telemetry -Monitor CBC daily -transfuse 1 unit pRBC -GI consult: will do EGD and colonoscopy on 6/19   Alcohol abuse with a history of withdrawal.  History of polysubstance abuse.  Last drink per patient was either on 6/14 or 6/15.  Reports remote history of marijuana use only.  Ethanol level negative.   -Monitor on CIWA protocol -Folic acid, thiamine, multivitamin -UDS pending - consider beta blocker or primidone for tremor  Mild transaminitis.  History of untreated chronic hepatitis C.  AST 100 ALT 22.  Patient appears jaundiced on exam and has a total bilirubin of 1.6 (elevated).  Also with abdominal swelling concerning for  -Hepatitis panel -Monitor LFTs  Hypokalemia, hypomagnesia, resolved after repletion.  -  monitor electrolytes  COPD. Uses Stiolto Respimat daily. - give Brovana nebs BID  Protein calorie malnutrition Patient's BMI is 15.8 and her albumin is 2.0.  Her significant other reports that she "never eats."  Her chronic alcoholism is likely playing a large role in her poor PO intake. - nutrition consult  FEN/GI: heart healthy PPx: none  Disposition: home  Subjective:  Debbie Grimes says she feels okay this morning and has a lot of questions about her anemia and whether it was life threatening.  She is amenable to work up and transfusion.  Her boyfriends says she often looks yellow and that her abdomen often appears swollen.  Objective: Temp:  [98.3 F (36.8 C)-99.1 F (37.3 C)] 98.4 F (36.9 C) (06/18 0615) Pulse Rate:  [88-118] 102 (06/18 0615) Resp:  [14-28] 17 (06/18 0615) BP: (91-133)/(58-83) 91/62 (06/18 0615) SpO2:  [94 %-100 %] 97 % (06/18 0615) Weight:  [96 lb 9.6 oz (43.8 kg)-101 lb (45.8 kg)] 101 lb (45.8 kg) (06/17 2052) Physical Exam: General: thin, jaundiced, elderly-appearing female lying in bed, NAD Cardiovascular: regular rhythm, slightly tachycardic, no MRG Respiratory: CTAB Abdomen: bloated appearing abdomen, no hepatosplenomegaly, nontender to palpation Extremities: thin, no edema, normal ROM  Laboratory: Recent Labs  Lab 10/09/17 1242 10/10/17 0504  WBC 7.5 8.1  HGB 7.5* 6.5*  HCT 23.8* 20.7*  PLT 228 183   Recent Labs  Lab 10/09/17 1242 10/10/17 0504  NA 136 135  K 3.3* 4.1  CL 101 103  CO2 22 26  BUN <5* <5*  CREATININE 0.70 0.58  CALCIUM 7.3* 7.7*  PROT 5.9* 5.1*  BILITOT 1.6* 1.6*  ALKPHOS 140* 133*  ALT 22 22  AST 100* 103*  GLUCOSE 111* 120*    Imaging/Diagnostic Tests: Dg Chest 2 View  Result Date: 10/09/2017 CLINICAL DATA:  Cough and weakness for the past 3 days. EXAM: CHEST - 2 VIEW COMPARISON:  CT chest and chest x-ray dated May 31, 2015. FINDINGS: The heart size and mediastinal contours are within normal limits.  Both lungs are clear. The visualized skeletal structures are unremarkable. IMPRESSION: No active cardiopulmonary disease. Electronically Signed   By: Titus Dubin M.D.   On: 10/09/2017 15:53   Ct Head Wo Contrast  Result Date: 10/09/2017 CLINICAL DATA:  52 y/o  F; 3 days of weakness.  Fall this morning. EXAM: CT HEAD WITHOUT CONTRAST TECHNIQUE: Contiguous axial images were obtained from the base of the skull through the vertex without intravenous contrast. COMPARISON:  01/27/2017 CT head. FINDINGS: Brain: No evidence of acute infarction, hemorrhage, hydrocephalus, extra-axial collection or mass lesion/mass effect. Stable brain parenchymal volume loss. Vascular: Calcific atherosclerosis of the carotid siphons. Skull: Normal. Negative for fracture or focal lesion. Sinuses/Orbits: Moderate right maxillary sinus mucosal thickening with aerosolized secretions. Chronic inflammatory changes of the wall of the sinus. Paranasal sinuses and mastoid air cells are otherwise normally aerated. Partial opacification of the right external auditory canal, likely cerumen. Orbits are unremarkable. Other: None. IMPRESSION: 1. No acute intracranial abnormality. 2. Stable brain parenchymal volume loss. 3. Stable chronic right maxillary sinusitis with aerosolized secretions which may represent a component of acute sinusitis. Electronically Signed   By: Kristine Garbe M.D.   On: 10/09/2017 16:14     Kathrene Alu, MD 10/10/2017, 6:43 AM PGY-1, [facility] Intern pager: [phone], text pages welcome

## 2017-10-10 NOTE — Progress Notes (Signed)
Initial Nutrition Assessment  DOCUMENTATION CODES:   Non-severe (moderate) malnutrition in context of chronic illness  INTERVENTION:  Boost Breeze po TID, each supplement provides 250 kcal and 9 grams of protein  Continue Thiamine 100mg  daily, MVI, Folic Acid 1mg   Will provide supplemental nutrition with advancement following EGD/Colonoscopy  NUTRITION DIAGNOSIS:   Moderate Malnutrition related to chronic illness as evidenced by moderate fat depletion, moderate muscle depletion.  GOAL:   Patient will meet greater than or equal to 90% of their needs  MONITOR:   PO intake, Supplement acceptance, Diet advancement  REASON FOR ASSESSMENT:   Malnutrition Screening Tool    ASSESSMENT:   Patient with PMH of chronic untreated hepatitis C, Fatty liver disease, ETOH abuse, COPD, polysubstance abuse, presents with symptomatic anemia and mild transaminitis.    Spoke with patient at bedside. She reports usual PO intake of toast for breakfast, 1/2 a sandwich for lunch, and whatever her boyfriend grills for dinner. Most recently they had porkchops with corn and a vegetable. Patient states when she drinks she does not eat. Also smokes 30 cigarettes daily which suppresses appetite as well.  Reports a UBW of 117 pounds 1-2 years ago. Recent weights in chart appear stated. Was 101 pounds upon admission. Has had 3 falls in the past week but her description of falls does not appear to be consistent with ataxia, often falls while sitting on the toilet urinating. Walks with a cane at baseline.   Of note, patient's boyfriend reports she often looks yellow and has a bloated abdomen. Abdomen was not distended upon exam, not jaundiced at this time. UOP 160mL last shift, 0.8L Fluid positive  Scheduled for EGD and Colonoscopy tomorrow. Will   Labs reviewed:  AST 103, TBili 1.6  Medications reviewed and include:  Folic Acid, MVI, thiamine    NUTRITION - FOCUSED PHYSICAL EXAM:    Most Recent  Value  Orbital Region  Moderate depletion  Upper Arm Region  Moderate depletion  Thoracic and Lumbar Region  Moderate depletion  Buccal Region  Mild depletion  Temple Region  Moderate depletion  Clavicle Bone Region  Moderate depletion  Clavicle and Acromion Bone Region  Moderate depletion  Scapular Bone Region  Severe depletion  Dorsal Hand  Moderate depletion  Patellar Region  Severe depletion  Anterior Thigh Region  Severe depletion  Posterior Calf Region  Severe depletion       Diet Order:   Diet Order           Diet clear liquid Room service appropriate? Yes; Fluid consistency: Thin  Diet effective now          EDUCATION NEEDS:   Not appropriate for education at this time  Skin:  Skin Assessment: Reviewed RN Assessment  Last BM:  PTA  Height:   Ht Readings from Last 1 Encounters:  10/09/17 5\' 7"  (1.702 m)    Weight:   Wt Readings from Last 1 Encounters:  10/09/17 101 lb (45.8 kg)    Ideal Body Weight:  61.36 kg  BMI:  Body mass index is 15.82 kg/m.  Estimated Nutritional Needs:   Kcal:  1700-2000 calories (HBE x1.5-1.75)  Protein:  78-92 grams (1.7-2g/kg)  Fluid:  1.5L    Debbie Anis. Lakeia Bradshaw, MS, RD LDN Inpatient Clinical Dietitian Pager [phone]

## 2017-10-10 NOTE — Progress Notes (Signed)
OT Note - Addendum    10/10/17 1600  OT Visit Information  Last OT Received On 10/10/17  OT Time Calculation  OT Start Time (ACUTE ONLY) 1328  OT Stop Time (ACUTE ONLY) 1358  OT Time Calculation (min) 30 min  OT General Charges  $OT Visit 1 Visit  Maurie Boettcher, OT/L  OT Clinical Specialist [phone]

## 2017-10-10 NOTE — Progress Notes (Signed)
OT Cancellation Note  Patient Details Name: Debbie Grimes MRN: [id number] DOB: May 09, 1965   Cancelled Treatment:    Reason Eval/Treat Not Completed: Other (comment). HgB 6.5. Pt currently getting blood. Will attempt this pm.   [place], OT/L  OT Clinical Specialist [phone]  10/10/2017, 10:59 AM

## 2017-10-10 NOTE — Progress Notes (Signed)
CRITICAL VALUE ALERT  Critical value received:  hgb 6.5  Date of notification:  6/18  Time of notification:  6:04  Critical value read back:Yes.    Nurse who received alert:  Lynetta Mare  MD notified (1st page):  Teaching service   Time of first page:  6:06  MD notified (2nd page):  Time of second page:  Responding MD:  Awaiting response   Time MD responded:
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
## 2017-10-11 ENCOUNTER — Inpatient Hospital Stay (HOSPITAL_COMMUNITY): Payer: Medicaid Other

## 2017-10-11 ENCOUNTER — Encounter (HOSPITAL_COMMUNITY)
Admission: EM | Disposition: A | Discharge status: Home Health | Payer: Self-pay | Source: Home / Self Care | Attending: Family Medicine

## 2017-10-11 ENCOUNTER — Encounter (HOSPITAL_COMMUNITY): Payer: Self-pay | Admitting: Gastroenterology

## 2017-10-11 DIAGNOSIS — K640 First degree hemorrhoids: Secondary | ICD-10-CM

## 2017-10-11 DIAGNOSIS — D122 Benign neoplasm of ascending colon: Secondary | ICD-10-CM

## 2017-10-11 DIAGNOSIS — K228 Other specified diseases of esophagus: Secondary | ICD-10-CM

## 2017-10-11 DIAGNOSIS — K449 Diaphragmatic hernia without obstruction or gangrene: Secondary | ICD-10-CM

## 2017-10-11 DIAGNOSIS — D123 Benign neoplasm of transverse colon: Secondary | ICD-10-CM

## 2017-10-11 DIAGNOSIS — R195 Other fecal abnormalities: Secondary | ICD-10-CM

## 2017-10-11 DIAGNOSIS — D120 Benign neoplasm of cecum: Secondary | ICD-10-CM

## 2017-10-11 DIAGNOSIS — K5520 Angiodysplasia of colon without hemorrhage: Secondary | ICD-10-CM

## 2017-10-11 DIAGNOSIS — K2270 Barrett's esophagus without dysplasia: Secondary | ICD-10-CM

## 2017-10-11 HISTORY — PX: COLONOSCOPY WITH PROPOFOL: SHX5780

## 2017-10-11 HISTORY — PX: BIOPSY: SHX5522

## 2017-10-11 HISTORY — PX: ESOPHAGOGASTRODUODENOSCOPY (EGD) WITH PROPOFOL: SHX5813

## 2017-10-11 HISTORY — PX: POLYPECTOMY: SHX5525

## 2017-10-11 LAB — TYPE AND SCREEN
ABO/RH(D): A NEG
ANTIBODY SCREEN: NEGATIVE
UNIT DIVISION: 0

## 2017-10-11 LAB — HEPATITIS PANEL, ACUTE
HCV Ab: >11 s/co ratio — ABNORMAL HIGH (ref 0.0–0.9)
HEP A IGM: NEGATIVE
HEP B C IGM: NEGATIVE
Hepatitis B Surface Ag: NEGATIVE

## 2017-10-11 LAB — HEMOGLOBIN AND HEMATOCRIT, BLOOD
HCT: 24.8 % — ABNORMAL LOW (ref 36.0–46.0)
HEMOGLOBIN: 8 g/dL — AB (ref 12.0–15.0)

## 2017-10-11 LAB — BPAM RBC
Blood Product Expiration Date: 201906212359
ISSUE DATE / TIME: 201906180904
UNIT TYPE AND RH: 9500

## 2017-10-11 SURGERY — ESOPHAGOGASTRODUODENOSCOPY (EGD) WITH PROPOFOL
Anesthesia: Monitor Anesthesia Care

## 2017-10-11 MED ORDER — LIDOCAINE 2% (20 MG/ML) 5 ML SYRINGE
INTRAMUSCULAR | Status: DC | PRN
  Administered 2017-10-11: 50 mg via INTRAVENOUS

## 2017-10-11 MED ORDER — PANTOPRAZOLE SODIUM 40 MG PO TBEC
40.0000 mg | DELAYED_RELEASE_TABLET | Freq: Every day | ORAL | Status: DC
  Administered 2017-10-11: 40 mg via ORAL
  Filled 2017-10-11: qty 1

## 2017-10-11 MED ORDER — LACTATED RINGERS IV SOLN
INTRAVENOUS | Status: DC
  Administered 2017-10-11: 09:00:00 via INTRAVENOUS

## 2017-10-11 MED ORDER — PANTOPRAZOLE SODIUM 40 MG PO TBEC: 40.0000 mg | DELAYED_RELEASE_TABLET | Freq: Every day | ORAL | 0 refills | Status: DC

## 2017-10-11 MED ORDER — FOLIC ACID 1 MG PO TABS: 1.0000 mg | ORAL_TABLET | Freq: Every day | ORAL | 0 refills | Status: DC

## 2017-10-11 MED ORDER — NICOTINE 14 MG/24HR TD PT24
14.0000 mg | MEDICATED_PATCH | Freq: Every day | TRANSDERMAL | Status: DC
  Administered 2017-10-11 (×2): 14 mg via TRANSDERMAL
  Filled 2017-10-11: qty 1

## 2017-10-11 MED ORDER — PROPOFOL 500 MG/50ML IV EMUL
INTRAVENOUS | Status: DC | PRN
  Administered 2017-10-11: 75 ug/kg/min via INTRAVENOUS

## 2017-10-11 MED ORDER — PHENYLEPHRINE HCL 10 MG/ML IJ SOLN
INTRAMUSCULAR | Status: DC | PRN
  Administered 2017-10-11 (×5): 80 ug via INTRAVENOUS
  Administered 2017-10-11 (×2): 40 ug via INTRAVENOUS
  Administered 2017-10-11 (×2): 80 ug via INTRAVENOUS
  Administered 2017-10-11 (×2): 40 ug via INTRAVENOUS
  Administered 2017-10-11: 80 ug via INTRAVENOUS

## 2017-10-11 MED ORDER — PROPOFOL 10 MG/ML IV BOLUS
INTRAVENOUS | Status: DC | PRN
  Administered 2017-10-11: 30 mg via INTRAVENOUS
  Administered 2017-10-11: 50 mg via INTRAVENOUS
  Administered 2017-10-11: 30 mg via INTRAVENOUS
  Administered 2017-10-11: 40 mg via INTRAVENOUS
  Administered 2017-10-11: 20 mg via INTRAVENOUS
  Administered 2017-10-11: 10 mg via INTRAVENOUS
  Administered 2017-10-11: 40 mg via INTRAVENOUS
  Administered 2017-10-11: 20 mg via INTRAVENOUS

## 2017-10-11 SURGICAL SUPPLY — 25 items

## 2017-10-11 NOTE — Anesthesia Preprocedure Evaluation (Signed)
Anesthesia Evaluation  Patient identified by MRN, date of birth, ID band Patient awake    Reviewed: Allergy & Precautions, NPO status , Patient's Chart, lab work & pertinent test results  History of Anesthesia Complications Negative for: history of anesthetic complications  Airway Mallampati: II  TM Distance: >3 FB Neck ROM: Full    Dental  (+) Edentulous Upper, Edentulous Lower   Pulmonary COPD, Current Smoker,    breath sounds clear to auscultation       Cardiovascular negative cardio ROS   Rhythm:Regular     Neuro/Psych PSYCHIATRIC DISORDERS Bipolar Disorder    GI/Hepatic negative GI ROS, (+) Hepatitis -, C  Endo/Other  negative endocrine ROS  Renal/GU negative Renal ROS     Musculoskeletal   Abdominal   Peds  Hematology  (+) anemia ,   Anesthesia Other Findings   Reproductive/Obstetrics                             Anesthesia Physical Anesthesia Plan  ASA: III  Anesthesia Plan: MAC   Post-op Pain Management:    Induction:   PONV Risk Score and Plan: 1 and Treatment may vary due to age or medical condition  Airway Management Planned: Nasal Cannula  Additional Equipment: None  Intra-op Plan:   Post-operative Plan:   Informed Consent: I have reviewed the patients History and Physical, chart, labs and discussed the procedure including the risks, benefits and alternatives for the proposed anesthesia with the patient or authorized representative who has indicated his/her understanding and acceptance.   Dental advisory given  Plan Discussed with: CRNA and Surgeon  Anesthesia Plan Comments:         Anesthesia Quick Evaluation

## 2017-10-11 NOTE — Progress Notes (Signed)
Discharge instructions given to patient and daughter. IV removed and instructions given on medication (doses, times, purpose) Diet is regular and GI MDs name and number available for appointment.  Stressed to pt necessity of follow up appointment with GI physician.
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
## 2017-10-11 NOTE — Anesthesia Postprocedure Evaluation (Signed)
Anesthesia Post Note  Patient: Debbie Grimes  Procedure(s) Performed: ESOPHAGOGASTRODUODENOSCOPY (EGD) WITH PROPOFOL (N/A ) COLONOSCOPY WITH PROPOFOL (N/A ) POLYPECTOMY BIOPSY     Patient location during evaluation: Endoscopy Anesthesia Type: MAC Level of consciousness: awake and alert Pain management: pain level controlled Vital Signs Assessment: post-procedure vital signs reviewed and stable Respiratory status: spontaneous breathing, nonlabored ventilation, respiratory function stable and patient connected to nasal cannula oxygen Cardiovascular status: stable and blood pressure returned to baseline Postop Assessment: no apparent nausea or vomiting Anesthetic complications: no    Last Vitals:  Vitals:   10/11/17 1200 10/11/17 1417  BP: 124/69 93/62  Pulse: 98 93  Resp: 20 20  Temp:  37.3 C  SpO2: 100% 100%    Last Pain:  Vitals:   10/11/17 1417  TempSrc: Oral  PainSc:                  Placido Hangartner

## 2017-10-11 NOTE — Interval H&P Note (Signed)
History and Physical Interval Note:  10/11/2017 9:50 AM  Debbie Grimes  has presented today for surgery, with the diagnosis of anemia.  FOBT +  The various methods of treatment have been discussed with the patient and family. After consideration of risks, benefits and other options for treatment, the patient has consented to  Procedure(s): ESOPHAGOGASTRODUODENOSCOPY (EGD) WITH PROPOFOL (N/A) COLONOSCOPY WITH PROPOFOL (N/A) as a surgical intervention .  The patient's history has been reviewed, patient examined, no change in status, stable for surgery.  I have reviewed the patient's chart and labs.  Questions were answered to the patient's satisfaction.     Pricilla Riffle. Fuller Plan

## 2017-10-11 NOTE — Care Management Note (Signed)
Case Management Note  Patient Details  Name: Debbie Grimes MRN: [id number] Date of Birth: Jun 03, 1965  Subjective/Objective:                    Action/Plan: Pt discharging home with [facility] services. Choice provided and [facility] selected. Jermaine with [facility] made aware and accepted the referral. Pt with orders for walker and 3 in 1. James with [facility] notified and delivered the equipment to the room.  Pt not sure who her assigned PCP is through her Medicaid. Pt's sister to look at her card and call CM with the information and call and schedule her an appointment. [facility] has agreed to cover the time between discharge and her appointment with her PCP. Jermaine with [facility] aware.  Sister's husband to provide transport home. Pts boyfriend to provide supervision at home.   Expected Discharge Date:  10/11/17               Expected Discharge Plan:  [place]  In-House Referral:     Discharge planning Services  CM Consult  Post Acute Care Choice:  Durable Medical Equipment, Home Health Choice offered to:  Patient, Sibling  DME Arranged:  3-N-1, Walker rolling DME Agency:  [place]:  PT, OT [facility] Agency:  [place]  Status of Service:  Completed, signed off  If discussed at [place] of Stay Meetings, dates discussed:    Additional Comments:  Pollie Friar, RN 10/11/2017, 4:34 PM

## 2017-10-11 NOTE — Progress Notes (Signed)
Family Medicine Teaching Service Daily Progress Note Intern Pager: [phone]  Patient name: Debbie Grimes Medical record number: [id number] Date of birth: 1965-06-20 Age: 52 y.o. Gender: female  Primary Care Provider: Patient, No Pcp Grimes Consultants: GI Code Status: full  Pt Overview and Major Events to Date:  Admitted 6/17 Blood transfusion 6/18  Assessment and Plan:  Debbie Grimes is a 52 y.o. female presenting with weakness. PMH is significant for chronic untreated hepatitis C, alcohol abuse, COPD, history of polysubstance abuse.  Symptomatic anemia, acute, improving. Hgb 8.0 on 6/19, improved from 6.5 on 6/18.  EGD and colonoscopy performed on 6/19: EGD showing evidence for Barrett esophagus, colonoscopy with several sessile polyps removed and taken to pathology.   -Monitor on telemetry -Monitor CBC daily -transfuse 1 unit pRBC -no NSAIDs for two weeks, follow up pathology, next colonoscopy in three years if pathology benign, Protonix 40 mg daily indefinitely   Alcohol abuse with a history of withdrawal.  History of polysubstance abuse.  Last drink Grimes patient was either on 6/14 or 6/15.  Reports remote history of marijuana use only.  Ethanol level negative.   -Monitor on CIWA protocol -Folic acid, thiamine, multivitamin -UDS pending - consider beta blocker or primidone for tremor  Mild transaminitis.  History of untreated chronic hepatitis C.  AST 100 ALT 22.  Patient appears jaundiced on exam and has a total bilirubin of 1.6 (elevated).  Also with abdominal swelling concerning for  -Hepatitis panel -Monitor LFTs  Hypokalemia, hypomagnesia, resolved after repletion.  - monitor electrolytes  COPD. Uses Stiolto Respimat daily. - give Brovana nebs BID  Protein calorie malnutrition Patient's BMI is 15.8 and her albumin is 2.0.  Her significant other reports that she "never eats."  Her chronic alcoholism is likely playing a large role in her poor PO intake. -  nutrition consult  FEN/GI: heart healthy PPx: none  Disposition: possible discharge home 6/19  Subjective:  Debbie Grimes is ready for her colonoscopy and EGD today.  She feels cold but has no other complaints today.  Objective: Temp:  [98.1 F (36.7 C)-99.4 F (37.4 C)] 99 F (37.2 C) (06/19 0657) Pulse Rate:  [94-116] 94 (06/19 0657) Resp:  [13-18] 17 (06/19 0657) BP: (86-115)/(57-82) 100/73 (06/19 0657) SpO2:  [98 %-100 %] 100 % (06/19 0657) Physical Exam: General: thin, elderly-appearing female lying in bed, NAD Cardiovascular: regular rhythm, slightly tachycardic, no MRG Respiratory: CTAB Abdomen: bloated appearing abdomen, no hepatosplenomegaly, nontender to palpation Extremities: thin, no edema, normal ROM  Laboratory: Recent Labs  Lab 10/09/17 1242 10/10/17 0504  WBC 7.5 8.1  HGB 7.5* 6.5*  HCT 23.8* 20.7*  PLT 228 183   Recent Labs  Lab 10/09/17 1242 10/10/17 0504  NA 136 135  K 3.3* 4.1  CL 101 103  CO2 22 26  BUN <5* <5*  CREATININE 0.70 0.58  CALCIUM 7.3* 7.7*  PROT 5.9* 5.1*  BILITOT 1.6* 1.6*  ALKPHOS 140* 133*  ALT 22 22  AST 100* 103*  GLUCOSE 111* 120*    Imaging/Diagnostic Tests: Dg Chest 2 View  Result Date: 10/09/2017 CLINICAL DATA:  Cough and weakness for the past 3 days. EXAM: CHEST - 2 VIEW COMPARISON:  CT chest and chest x-ray dated May 31, 2015. FINDINGS: The heart size and mediastinal contours are within normal limits. Both lungs are clear. The visualized skeletal structures are unremarkable. IMPRESSION: No active cardiopulmonary disease. Electronically Signed   By: Titus Dubin M.D.   On: 10/09/2017 15:53  Ct Head Wo Contrast  Result Date: 10/09/2017 CLINICAL DATA:  52 y/o  F; 3 days of weakness.  Fall this morning. EXAM: CT HEAD WITHOUT CONTRAST TECHNIQUE: Contiguous axial images were obtained from the base of the skull through the vertex without intravenous contrast. COMPARISON:  01/27/2017 CT head. FINDINGS: Brain: No  evidence of acute infarction, hemorrhage, hydrocephalus, extra-axial collection or mass lesion/mass effect. Stable brain parenchymal volume loss. Vascular: Calcific atherosclerosis of the carotid siphons. Skull: Normal. Negative for fracture or focal lesion. Sinuses/Orbits: Moderate right maxillary sinus mucosal thickening with aerosolized secretions. Chronic inflammatory changes of the wall of the sinus. Paranasal sinuses and mastoid air cells are otherwise normally aerated. Partial opacification of the right external auditory canal, likely cerumen. Orbits are unremarkable. Other: None. IMPRESSION: 1. No acute intracranial abnormality. 2. Stable brain parenchymal volume loss. 3. Stable chronic right maxillary sinusitis with aerosolized secretions which may represent a component of acute sinusitis. Electronically Signed   By: Kristine Garbe M.D.   On: 10/09/2017 16:14     Kathrene Alu, MD 10/11/2017, 7:28 AM PGY-1, [place] Intern pager: [phone], text pages welcome

## 2017-10-11 NOTE — Op Note (Addendum)
[facility] Patient Name: Debbie Grimes Procedure Date : 10/11/2017 MRN: [id number] Attending MD: Ladene Artist , MD Date of Birth: Oct 07, 1965 CSN: [id number] Age: 52 Admit Type: Inpatient Procedure:                Colonoscopy Indications:              Heme positive stool, anemia Providers:                Pricilla Riffle. Fuller Plan, MD, Cleda Daub, RN, Alan Mulder, Technician Referring MD:             [facility] Medicines:                Monitored Anesthesia Care Complications:            No immediate complications. Estimated blood loss:                            None. Estimated Blood Loss:     Estimated blood loss: none. Procedure:                Pre-Anesthesia Assessment:                           - Prior to the procedure, a History and Physical                            was performed, and patient medications and                            allergies were reviewed. The patient's tolerance of                            previous anesthesia was also reviewed. The risks                            and benefits of the procedure and the sedation                            options and risks were discussed with the patient.                            All questions were answered, and informed consent                            was obtained. Prior Anticoagulants: The patient has                            taken no previous anticoagulant or antiplatelet                            agents. ASA Grade Assessment: III - A patient with  severe systemic disease. After reviewing the risks                            and benefits, the patient was deemed in                            satisfactory condition to undergo the procedure.                           After obtaining informed consent, the colonoscope                            was passed under direct vision. Throughout the                            procedure, the patient's  blood pressure, pulse, and                            oxygen saturations were monitored continuously. The                            EC-[id number] ([id number]) scope was introduced through                            the anus and advanced to the the cecum, identified                            by appendiceal orifice and ileocecal valve. The                            ileocecal valve, appendiceal orifice, and rectum                            were photographed. The quality of the bowel                            preparation was good. The colonoscopy was performed                            without difficulty. The patient tolerated the                            procedure well. Scope In: 10:50:41 AM Scope Out: 11:10:16 AM Scope Withdrawal Time: 0 hours 16 minutes 12 seconds  Total Procedure Duration: 0 hours 19 minutes 35 seconds  Findings:      The perianal and digital rectal examinations were normal except for       decrease sphincter tone.      A 14 mm polyp was found in the ileocecal valve. The polyp was sessile.       The polyp was removed with a hot snare. Resection and retrieval were       complete.      A 6 mm polyp was found in the ascending colon. The polyp was sessile.       The polyp was removed  with a cold snare. Resection and retrieval were       complete.      Two medium-sized localized angiodysplastic lesions without bleeding were       found in the cecum.      A 10 mm polyp was found in the transverse colon. The polyp was       semi-pedunculated. The polyp was removed with a hot snare. Resection and       retrieval were complete.      Internal hemorrhoids were found during retroflexion. The hemorrhoids       were small and Grade I (internal hemorrhoids that do not prolapse).      The exam was otherwise without abnormality on direct and retroflexion       views. Impression:               - One 14 mm polyp at the ileocecal valve, removed                            with a hot  snare. Resected and retrieved.                           - One 6 mm polyp in the ascending colon, removed                            with a cold snare. Resected and retrieved.                           - Two non-bleeding colonic angiodysplastic lesions.                           - One 10 mm polyp in the transverse colon, removed                            with a hot snare. Resected and retrieved.                           - Internal hemorrhoids.                           - The examination was otherwise normal on direct                            and retroflexion views. Recommendation:           - Repeat colonoscopy in 3 years for surveillance                            pending pathology review.                           - Patient has a contact number available for                            emergencies. The signs and symptoms of potential  delayed complications were discussed with the                            patient. Return to normal activities tomorrow.                            Written discharge instructions were provided to the                            patient.                           - Resume previous diet.                           - Continue present medications.                           - Await pathology results.                           - No aspirin, ibuprofen, naproxen, or other                            non-steroidal anti-inflammatory drugs for 2 weeks                            after polyp removal. Procedure Code(s):        --- Professional ---                           [phone], Colonoscopy, flexible; with removal of                            tumor(s), polyp(s), or other lesion(s) by snare                            technique Diagnosis Code(s):        --- Professional ---                           D12.0, Benign neoplasm of cecum                           D12.2, Benign neoplasm of ascending colon                           D12.3, Benign  neoplasm of transverse colon (hepatic                            flexure or splenic flexure)                           K55.20, Angiodysplasia of colon without hemorrhage                           K64.0, First degree hemorrhoids CPT copyright 2017 American Medical  Association. All rights reserved. The codes documented in this report are preliminary and upon coder review may  be revised to meet current compliance requirements. Ladene Artist, MD 10/11/2017 11:18:16 AM This report has been signed electronically. Number of Addenda: 0

## 2017-10-11 NOTE — Transfer of Care (Signed)
Immediate Anesthesia Transfer of Care Note  Patient: Debbie Grimes  Procedure(s) Performed: ESOPHAGOGASTRODUODENOSCOPY (EGD) WITH PROPOFOL (N/A ) COLONOSCOPY WITH PROPOFOL (N/A ) POLYPECTOMY BIOPSY  Patient Location: PACU and Endoscopy Unit  Anesthesia Type:MAC  Level of Consciousness: drowsy and patient cooperative  Airway & Oxygen Therapy: Patient Spontanous Breathing and Patient connected to nasal cannula oxygen  Post-op Assessment: Report given to RN and Post -op Vital signs reviewed and stable  Post vital signs: Reviewed and stable  Last Vitals:  Vitals Value Taken Time  BP    Temp    Pulse    Resp    SpO2      Last Pain:  Vitals:   10/11/17 0908  TempSrc: Oral  PainSc: 0-No pain      Patients Stated Pain Goal: 0 (17/53/01 0404)  Complications: No apparent anesthesia complications

## 2017-10-11 NOTE — Op Note (Signed)
[facility] Patient Name: Debbie Grimes Procedure Date : 10/11/2017 MRN: [id number] Attending MD: Ladene Artist , MD Date of Birth: 1966-04-18 CSN: [id number] Age: 52 Admit Type: Inpatient Procedure:                Upper GI endoscopy Indications:              Heme positive stool, anemia Providers:                Pricilla Riffle. Fuller Plan, MD, Cleda Daub, RN, Alan Mulder, Technician Referring MD:             [facility] Medicines:                Monitored Anesthesia Care Complications:            No immediate complications. Estimated Blood Loss:     Estimated blood loss was minimal. Procedure:                Pre-Anesthesia Assessment:                           - Prior to the procedure, a History and Physical                            was performed, and patient medications and                            allergies were reviewed. The patient's tolerance of                            previous anesthesia was also reviewed. The risks                            and benefits of the procedure and the sedation                            options and risks were discussed with the patient.                            All questions were answered, and informed consent                            was obtained. Prior Anticoagulants: The patient has                            taken no previous anticoagulant or antiplatelet                            agents. ASA Grade Assessment: III - A patient with                            severe systemic disease. After reviewing the risks  and benefits, the patient was deemed in                            satisfactory condition to undergo the procedure.                           After obtaining informed consent, the endoscope was                            passed under direct vision. Throughout the                            procedure, the patient's blood pressure, pulse, and            oxygen saturations were monitored continuously. The                            EG-[id number] ([id number]) scope was introduced through the                            mouth, and advanced to the second part of duodenum.                            The upper GI endoscopy was accomplished without                            difficulty. The patient tolerated the procedure                            well. Scope In: Scope Out: Findings:      Circumferential salmon-colored mucosa was present from 32 to 35 cm. The       maximum longitudinal extent of these esophageal mucosal changes was 3 cm       in length. Biopsies were taken with a cold forceps for histology.      The exam of the esophagus was otherwise normal.      A small hiatal hernia was present.      The exam of the stomach was otherwise normal.      The duodenal bulb and second portion of the duodenum were normal. Impression:               - Salmon-colored mucosa suggestive of long-segment                            Barrett's esophagus. Biopsied.                           - Small hiatal hernia.                           - Normal duodenal bulb and second portion of the                            duodenum. Recommendation:           - Return patient to hospital ward for ongoing care.                           -  Resume previous diet.                           - Continue present medications.                           - Await pathology results.                           - Prilosec (omeprazole) 40 mg PO daily indefinitely. Procedure Code(s):        --- Professional ---                           [phone], Esophagogastroduodenoscopy, flexible,                            transoral; with biopsy, single or multiple Diagnosis Code(s):        --- Professional ---                           K22.8, Other specified diseases of esophagus                           K44.9, Diaphragmatic hernia without obstruction or                            gangrene                            R19.5, Other fecal abnormalities CPT copyright 2017 American Medical Association. All rights reserved. The codes documented in this report are preliminary and upon coder review may  be revised to meet current compliance requirements. Ladene Artist, MD 10/11/2017 11:30:22 AM This report has been signed electronically. Number of Addenda: 0

## 2017-10-11 NOTE — Progress Notes (Signed)
PT Cancellation Note  Patient Details Name: Debbie Grimes MRN: [id number] DOB: 05/21/1965   Cancelled Treatment:    Reason Eval/Treat Not Completed: Patient at procedure or test/unavailable   At Endo most of am;   Will follow up later today as time allows;  Otherwise, will follow up for PT eval next available date;   Thank you,  Roney Marion, PT  Acute Rehabilitation Services Pager [phone] Office [phone]     Colletta [place] 10/11/2017, 12:22 PM

## 2017-10-11 NOTE — Progress Notes (Signed)
Family Medicine progress note  Patient to look at [facility] card to see who she can get for pcp. Patient ok to use family medicine center as temporary PCP until she is able to get follow up arranged. Appreciate CM help in this area.  Guadalupe Dawn MD PGY-1 Family Medicine Resident

## 2017-10-11 NOTE — Discharge Instructions (Signed)
Please take Protonix 40 mg daily.  This will protect your stomach from acid and help prevent bleeding and irritation.  You can buy an equivalent medication called Prilosec or Nexium at [facility] or any pharmacy. Please do not take ibuprofen or naproxen for the next two weeks.  Please follow up on your GI doctors to learn what time of polyps you have.  You may need further treatment if your polyps are a certain type.  You would really benefit from seeing a doctor regularly to keep you healthier and out of the hospital.  Please call our office at [phone] to set up an appointment.

## 2017-10-12 ENCOUNTER — Encounter (HOSPITAL_COMMUNITY): Payer: Self-pay | Admitting: Gastroenterology

## 2017-10-12 ENCOUNTER — Encounter: Payer: Self-pay | Admitting: Gastroenterology

## 2017-10-13 NOTE — Discharge Summary (Signed)
[facility] Discharge Summary  Patient name: Debbie Grimes Medical record number: [id number] Date of birth: 04-10-1966 Age: 52 y.o. Gender: female Date of Admission: 10/09/2017  Date of Discharge: 10/11/17 Admitting Physician: Bufford Lope, DO  Primary Care Provider: Patient, No Pcp Per Consultants: GI  Indication for Hospitalization: symptomatic anemia  Discharge Diagnoses/Problem List:  Anemia Alcohol abuse with history of withdrawal Mild transaminitis and hyperbilirubinemia COPD Hepatitis C, self-resolved Protein calorie malnutrition  Disposition: home  Discharge Condition: stable, improved  Discharge Exam:  General: thin, elderly-appearing female lying in bed, NAD Cardiovascular: regular rhythm, slightly tachycardic, no MRG Respiratory: CTAB Abdomen: bloated appearing abdomen, no hepatosplenomegaly, nontender to palpation Extremities: thin, no edema, normal ROM  Brief Hospital Course:  Debbie Grimes was admitted on 6/17 for symptomatic anemia, which was 7.5 on admission and 6.5 the next morning.  FOBT was positive.  She received one unit of pRBC with improvement in Hgb to 8.0.  GI was consulted, and an EGD was performed, which showed evidence for Barrett esophagus, and colonoscopy was done, where several sessile polyps were removed and taken to pathology.  GI advised that she abstain from NSAIDs for two weeks, take Protonix 40 mg daily indefinitely, and get a repeat colonoscopy in three years.  Patient was monitored for alcohol withdrawal during her hospital stay but did not have withdrawal symptoms or need any intervention.  She was discharged on 10/11/17 after her hemoglobin remained stable and she was cleared by GI.    Issues for Follow Up:  1. Please follow up on GI pathology report and educate patient on GI recommendations, including repeat colonoscopy in three years. 2. Consider looking into patient's hyperbilirubinemia and transaminitis.   Patient may have liver cirrhosis due to hepatitis C (although the viral infection appears to be self-resolved) as well as pancreatic or gallbladder disease. 3. Patient would highly benefit from counseling for alcohol abuse.  She may also need protein shakes at home due to her malnutrition and poor PO intake.  Significant Procedures: EGD, colonoscopy  Significant Labs and Imaging:  Recent Labs  Lab 10/09/17 1242 10/10/17 0504 10/11/17 0725  WBC 7.5 8.1  --   HGB 7.5* 6.5* 8.0*  HCT 23.8* 20.7* 24.8*  PLT 228 183  --    Recent Labs  Lab 10/09/17 1242 10/09/17 1723 10/10/17 0504 10/10/17 0848  NA 136  --  135  --   K 3.3*  --  4.1  --   CL 101  --  103  --   CO2 22  --  26  --   GLUCOSE 111*  --  120*  --   BUN <5*  --  <5*  --   CREATININE 0.70  --  0.58  --   CALCIUM 7.3*  --  7.7*  --   MG  --  1.3* 2.2  --   PHOS  --   --   --  1.7*  ALKPHOS 140*  --  133*  --   AST 100*  --  103*  --   ALT 22  --  22  --   ALBUMIN 2.3*  --  2.0*  --     Results/Tests Pending at Time of Discharge: GI pathology report  Discharge Medications:  Allergies as of 10/11/2017   No Known Allergies     Medication List    TAKE these medications   acetaminophen 500 MG tablet Commonly known as:  TYLENOL Take 2 tablets (1,000 mg  total) by mouth every 6 (six) hours as needed for mild pain.   cyanocobalamin 100 MCG tablet Take 1 tablet (100 mcg total) by mouth daily.   ferrous sulfate 325 (65 FE) MG tablet Take 1 tablet (325 mg total) by mouth daily with breakfast.   folic acid 1 MG tablet Commonly known as:  FOLVITE Take 1 tablet (1 mg total) by mouth daily.   multivitamin with minerals Tabs tablet Take 1 tablet by mouth daily.   nicotine 21 mg/24hr patch Commonly known as:  NICODERM CQ - dosed in mg/24 hours Place 1 patch (21 mg total) onto the skin daily.   pantoprazole 40 MG tablet Commonly known as:  PROTONIX Take 1 tablet (40 mg total) by mouth daily.   STIOLTO RESPIMAT  2.5-2.5 MCG/ACT Aers Generic drug:  Tiotropium Bromide-Olodaterol Inhale 1 Inhaler into the lungs daily.   thiamine 100 MG tablet Take 1 tablet (100 mg total) by mouth daily.       Discharge Instructions: Please refer to Patient Instructions section of EMR for full details.  Patient was counseled important signs and symptoms that should prompt return to medical care, changes in medications, dietary instructions, activity restrictions, and follow up appointments.   Follow-Up Appointments: Patient was encouraged to follow up at [facility].  Kathrene Alu, MD 10/13/2017, 5:02 PM PGY-1, [place]

## 2017-10-14 ENCOUNTER — Other Ambulatory Visit: Payer: Self-pay | Admitting: Family Medicine

## 2017-10-16 ENCOUNTER — Telehealth: Payer: Self-pay

## 2017-10-16 NOTE — Telephone Encounter (Signed)
Verbal orders given as requested. 

## 2017-10-16 NOTE — Telephone Encounter (Signed)
Debbie Grimes, PT with [facility], has done PT eval ordered at discharge. Needs verbal orders:  PT 2 x/week x 2 weeks, then 1x/week x 1 week; Also MSW eval for transportation issues.  Call back is [phone]  Danley Danker, RN [facility] RN)

## 2017-10-30 ENCOUNTER — Encounter: Payer: Self-pay | Admitting: Gastroenterology

## 2017-11-08 ENCOUNTER — Emergency Department (HOSPITAL_COMMUNITY)
Admission: EM | Admit: 2017-11-08 | Discharge: 2017-11-09 | Disposition: A | Discharge status: Home / Self Care | Payer: Medicaid Other | Attending: Emergency Medicine | Admitting: Emergency Medicine

## 2017-11-08 ENCOUNTER — Encounter (HOSPITAL_COMMUNITY): Payer: Self-pay | Admitting: *Deleted

## 2017-11-08 ENCOUNTER — Other Ambulatory Visit: Payer: Self-pay

## 2017-11-08 DIAGNOSIS — K7031 Alcoholic cirrhosis of liver with ascites: Secondary | ICD-10-CM | POA: Diagnosis not present

## 2017-11-08 DIAGNOSIS — Z79899 Other long term (current) drug therapy: Secondary | ICD-10-CM | POA: Insufficient documentation

## 2017-11-08 DIAGNOSIS — F17210 Nicotine dependence, cigarettes, uncomplicated: Secondary | ICD-10-CM | POA: Insufficient documentation

## 2017-11-08 DIAGNOSIS — D649 Anemia, unspecified: Secondary | ICD-10-CM | POA: Insufficient documentation

## 2017-11-08 DIAGNOSIS — N390 Urinary tract infection, site not specified: Secondary | ICD-10-CM | POA: Insufficient documentation

## 2017-11-08 DIAGNOSIS — J449 Chronic obstructive pulmonary disease, unspecified: Secondary | ICD-10-CM | POA: Diagnosis not present

## 2017-11-08 DIAGNOSIS — R1030 Lower abdominal pain, unspecified: Secondary | ICD-10-CM | POA: Insufficient documentation

## 2017-11-08 LAB — CBC
HCT: 25.1 % — ABNORMAL LOW (ref 36.0–46.0)
HEMOGLOBIN: 7.8 g/dL — AB (ref 12.0–15.0)
MCH: 30 pg (ref 26.0–34.0)
MCHC: 31.1 g/dL (ref 30.0–36.0)
MCV: 96.5 fL (ref 78.0–100.0)
PLATELETS: 393 10*3/uL (ref 150–400)
RBC: 2.6 MIL/uL — AB (ref 3.87–5.11)
RDW: 21.2 % — ABNORMAL HIGH (ref 11.5–15.5)
WBC: 12.4 10*3/uL — ABNORMAL HIGH (ref 4.0–10.5)

## 2017-11-08 LAB — COMPREHENSIVE METABOLIC PANEL
ALK PHOS: 102 U/L (ref 38–126)
ALT: 18 U/L (ref 0–44)
ANION GAP: 9 (ref 5–15)
AST: 75 U/L — ABNORMAL HIGH (ref 15–41)
Albumin: 2.1 g/dL — ABNORMAL LOW (ref 3.5–5.0)
BILIRUBIN TOTAL: 1 mg/dL (ref 0.3–1.2)
BUN: <5 mg/dL — ABNORMAL LOW (ref 6–20)
CALCIUM: 8 mg/dL — AB (ref 8.9–10.3)
CO2: 22 mmol/L (ref 22–32)
CREATININE: 0.8 mg/dL (ref 0.44–1.00)
Chloride: 106 mmol/L (ref 98–111)
GFR calc non Af Amer: >60 mL/min (ref >60)
Glucose, Bld: 92 mg/dL (ref 70–99)
Potassium: 3.4 mmol/L — ABNORMAL LOW (ref 3.5–5.1)
Sodium: 137 mmol/L (ref 135–145)
TOTAL PROTEIN: 5.7 g/dL — AB (ref 6.5–8.1)

## 2017-11-08 LAB — LIPASE, BLOOD: Lipase: 35 U/L (ref 11–51)

## 2017-11-08 MED ORDER — MORPHINE SULFATE (PF) 4 MG/ML IV SOLN
4.0000 mg | Freq: Once | INTRAVENOUS | Status: AC
  Administered 2017-11-09: 4 mg via INTRAVENOUS
  Filled 2017-11-08: qty 1

## 2017-11-08 NOTE — ED Notes (Signed)
Patient requesting pain medication.  Made aware of rooming

## 2017-11-08 NOTE — ED Notes (Signed)
ED Provider at bedside. 

## 2017-11-08 NOTE — ED Notes (Signed)
Patient informed that her daughter has called our Network engineer and requested her to call her back

## 2017-11-08 NOTE — ED Provider Notes (Addendum)
[place] EMERGENCY DEPARTMENT Provider Note   CSN: [id number] Arrival date & time: 11/08/17  1742     History   Chief Complaint Chief Complaint  Patient presents with  . Abdominal Pain    HPI Debbie Grimes is a 52 y.o. female.  The history is provided by the patient.  Abdominal Pain    She has a history of COPD, hepatitis C, alcoholism, bipolar disorder, protein calorie malnutrition, and anemia from blood loss and comes in complaining of lower abdominal pain and abdominal swelling for the last 2 days.  Pain is rated at 9/10.  She denies any nausea or vomiting denies constipation or diarrhea.  Appetite has been diminished, but she is able to eat, and eating does not make pain worse.  She is also noted swelling in her feet over the last 2 days.  She states that she has not had anything to drink in the last month, but she was drinking 80 ounces of beer a day before that.  She is still smoking half pack of cigarettes a day.  Past Medical History:  Diagnosis Date  . Alcoholism ([place]) 2000   chronic  . Bipolar 1 disorder ([place]) 2010   with depression and anxiety  . COPD (chronic obstructive pulmonary disease) ([place])   . Falls frequently   . Hepatic steatosis 0/2725   alcoholic fatty liver.  Metavir fibrosis score F 2 with some F3  . Hepatitis C   . Hepatitis C antibody test positive 09/2015   HCV quant: no virus detected.   . Polysubstance abuse ([place]) 2000  . Protein-calorie malnutrition ([place]) 2012  . Symptomatic anemia 10/09/2017    Patient Active Problem List   Diagnosis Date Noted  . Heme positive stool   . Benign neoplasm of ascending colon   . Benign neoplasm of cecum   . Benign neoplasm of transverse colon   . Anemia due to blood loss 10/10/2017  . Malnutrition of moderate degree 10/10/2017  . Symptomatic anemia 10/09/2017  . Weakness   . Chronic obstructive pulmonary disease ([place])   . 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 Hepatic steatosis 09/30/2015  . Anemia, iron deficiency   . Chronic hepatitis C without hepatic coma ([place])   . Alcoholic liver disease ([place])   . Protein-calorie malnutrition, severe 09/29/2015  . Alcohol withdrawal ([place]) 09/28/2015  . Normocytic anemia 07/12/2015  . Leg wound, left 07/10/2015  . ETOH abuse 07/10/2015  . Heavy cigarette smoker (20-39 per day) 07/10/2015    Past Surgical History:  Procedure Laterality Date  . BIOPSY  10/11/2017   Procedure: BIOPSY;  Surgeon: Ladene Artist, MD;  Location: [place];  Service: Endoscopy;;  . COLONOSCOPY WITH PROPOFOL N/A 10/11/2017   Procedure: COLONOSCOPY WITH PROPOFOL;  Surgeon: Ladene Artist, MD;  [facility] END[facility]COPY;  Service: Endoscopy;  Laterality: N/A;  . ESOPHAGOGASTRODUODENOSCOPY (EGD) WITH PROPOFOL N/A 10/11/2017   Procedure: ESOPHAGOGASTRODUODENOSCOPY (EGD) WITH PROPOFOL;  Surgeon: Ladene Artist, MD;  Location: [facility] ENDOSCOPY;  Service: Endoscopy;  Laterality: N/A;  . hallux laceration, repair distal phalanx fracture Left 01/2011   lawn mower injury.  . IRRIGATION AND DEBRIDEMENT SEBACEOUS CYST  06/2003   nasal   . POLYPECTOMY  10/11/2017   Procedure: POLYPECTOMY;  Surgeon: Ladene Artist, MD;  Location: [facility] ENDOSCOPY;  Service: Endoscopy;;  . TUBAL LIGATION       OB History    Gravida  2  Para  2   Term  0   Preterm  0   AB  0   Living        SAB  0   TAB  0   Ectopic  0   Multiple      Live Births               Home Medications    Prior to Admission medications   Medication Sig Start Date End Date Taking? Authorizing Provider  acetaminophen (TYLENOL) 500 MG tablet Take 2 tablets (1,000 mg total) by mouth every 6 (six) hours as needed for mild pain. Patient not taking: Reported on 10/09/2017 12/09/16   Antonietta Breach, PA-C  ferrous sulfate 325 (65 FE) MG tablet Take 1 tablet (325 mg total) by mouth daily with breakfast. Patient not taking: Reported on  12/09/2016 10/02/15   Zada Finders, MD  folic acid (FOLVITE) 1 MG tablet TAKE 1 TABLET BY MOUTH EVERY DAY 10/16/17   Guadalupe Dawn, MD  Multiple Vitamin (MULTIVITAMIN WITH MINERALS) TABS tablet Take 1 tablet by mouth daily. Patient not taking: Reported on 10/09/2017 10/02/15   Zada Finders, MD  nicotine (NICODERM CQ - DOSED IN MG/24 HOURS) 21 mg/24hr patch Place 1 patch (21 mg total) onto the skin daily. Patient not taking: Reported on 12/09/2016 10/02/15   Zada Finders, MD  pantoprazole (PROTONIX) 40 MG tablet TAKE 1 TABLET BY MOUTH EVERY DAY 10/16/17   Guadalupe Dawn, MD  thiamine 100 MG tablet Take 1 tablet (100 mg total) by mouth daily. Patient not taking: Reported on 10/09/2017 10/02/15   Zada Finders, MD  Tiotropium Bromide-Olodaterol (STIOLTO RESPIMAT) 2.5-2.5 MCG/ACT AERS Inhale 1 Inhaler into the lungs daily.    [provider]  vitamin B-12 100 MCG tablet Take 1 tablet (100 mcg total) by mouth daily. Patient not taking: Reported on 12/09/2016 10/02/15   Zada Finders, MD    Family History No family history on file.  Social History Social History   Tobacco Use  . Smoking status: Current Every Day Smoker    Packs/day: 1.00    Years: 39.00    Pack years: 39.00    Types: Cigarettes  . Smokeless tobacco: Never Used  . Tobacco comment: 2-3 packs/day since age 12  Substance Use Topics  . Alcohol use: Not Currently    Alcohol/week: 8.4 oz    Types: 14 Cans of beer per week    Comment: 09/08/2017 "22oz beer qd"  . Drug use: Never     Allergies   Patient has no known allergies.   Review of Systems Review of Systems  Gastrointestinal: Positive for abdominal pain.  All other systems reviewed and are negative.    Physical Exam Updated Vital Signs BP 116/90 (BP Location: Right Arm)   Pulse 95   Temp 98.7 F (37.1 C) (Oral)   Resp 14   Ht 5\' 7"  (1.702 m)   Wt 45.8 kg (101 lb)   SpO2 100%   BMI 15.82 kg/m   Physical Exam  Nursing note and vitals reviewed.  52  year old female who appears much older than her stated age, resting comfortably and in no acute distress. Vital signs are normal. Oxygen saturation is 100%, which is normal. Head is normocephalic and atraumatic. PERRLA, EOMI. Oropharynx is clear. Neck is nontender and supple without adenopathy or JVD. Back is nontender and there is no CVA tenderness. Lungs are clear without rales, wheezes, or rhonchi. Chest is nontender. Heart has regular rate and  rhythm without murmur. Abdomen is soft, mildly distended with positive fluid wave.  There is mild tenderness across lower abdomen without rebound or guarding.  There are no masses or hepatosplenomegaly and peristalsis is hypoactive. Extremities have 2+ pretibial and pedal edema, full range of motion is present. Skin is warm and dry without rash. Neurologic: Mental status is normal, cranial nerves are intact, there are no motor or sensory deficits.  ED Treatments / Results  Labs (all labs ordered are listed, but only abnormal results are displayed) Labs Reviewed  COMPREHENSIVE METABOLIC PANEL - Abnormal; Notable for the following components:      Result Value   Potassium 3.4 (*)    BUN <5 (*)    Calcium 8.0 (*)    Total Protein 5.7 (*)    Albumin 2.1 (*)    AST 75 (*)    All other components within normal limits  CBC - Abnormal; Notable for the following components:   WBC 12.4 (*)    RBC 2.60 (*)    Hemoglobin 7.8 (*)    HCT 25.1 (*)    RDW 21.2 (*)    All other components within normal limits  URINALYSIS, ROUTINE W REFLEX MICROSCOPIC - Abnormal; Notable for the following components:   Color, Urine AMBER (*)    APPearance CLOUDY (*)    Protein, ur 30 (*)    Nitrite POSITIVE (*)    Leukocytes, UA TRACE (*)    Bacteria, UA MANY (*)    All other components within normal limits  URINE CULTURE  LIPASE, BLOOD   Radiology Ct Abdomen Pelvis W Contrast  Result Date: 11/09/2017 CLINICAL DATA:  52 year old female with abdominal distention  and ascites. EXAM: CT ABDOMEN AND PELVIS WITH CONTRAST TECHNIQUE: Multidetector CT imaging of the abdomen and pelvis was performed using the standard protocol following bolus administration of intravenous contrast. CONTRAST:  167mL OMNIPAQUE IOHEXOL 300 MG/ML  SOLN COMPARISON:  Abdominal CT dated 12/09/2016 FINDINGS: Lower chest: Small bilateral pleural effusions and associated compressive atelectasis versus infiltrate. Coronary vascular calcification. Small ascites.  No intra-abdominal free air. Hepatobiliary: There may be minimal irregularity of the liver capsule suggestive of early changes of cirrhosis. Clinical correlation is recommended. No intrahepatic biliary ductal dilatation. There is a stone within the gallbladder. Pancreas: Unremarkable. No pancreatic ductal dilatation or surrounding inflammatory changes. Spleen: Normal in size without focal abnormality. Adrenals/Urinary Tract: The adrenal glands are unremarkable. There is a 3 mm nonobstructing left renal inferior pole calculus. There is no hydronephrosis on either side. There is symmetric enhancement and excretion of contrast by both kidneys. The visualized ureters and urinary bladder appear unremarkable. Stomach/Bowel: There is segmental thickening of the proximal colon extending from the cecum to the mid transverse colon concerning for colitis. This is less likely related to ascites or hepatic colopathy. There is a 2 cm duodenal diverticulum. There is no bowel obstruction. The appendix fills with contrast. Vascular/Lymphatic: Moderate aortoiliac atherosclerotic disease. No portal venous gas. The SMV, splenic vein, and main portal vein are patent. There is no adenopathy. Reproductive: The uterus and ovaries are grossly unremarkable. Other: Mild diffuse subcutaneous edema. Musculoskeletal: Osteopenia with levoscoliosis of the lumbar spine. No acute osseous pathology. IMPRESSION: 1. Thickening of the proximal colon concerning for colitis. Correlation with  clinical exam and stool cultures recommended. No bowel obstruction. Normal appearing appendix. 2. Small to moderate ascites. 3. Possible minimal nodularity of the liver contour which may represent early changes of cirrhosis. Clinical correlation is recommended. Elastography may provide better evaluation.  4. Gallstone and a small nonobstructing left renal interpolar calculus. 5.  Aortic Atherosclerosis (ICD10-I70.0). Electronically Signed   By: Anner Crete M.D.   On: 11/09/2017 03:39    Procedures Procedures   Medications Ordered in ED Medications  iopamidol (ISOVUE-300) 61 % injection (has no administration in time range)  cefTRIAXone (ROCEPHIN) 1 g in sodium chloride 0.9 % 100 mL IVPB (1 g Intravenous New Bag/Given 11/09/17 0515)  morphine 4 MG/ML injection 4 mg (4 mg Intravenous Given 11/09/17 0032)  iohexol (OMNIPAQUE) 300 MG/ML solution 100 mL (100 mLs Intravenous Contrast Given 11/09/17 0301)     Initial Impression / Assessment and Plan / ED Course  I have reviewed the triage vital signs and the nursing notes.  Pertinent labs & imaging results that were available during my care of the patient were reviewed by me and considered in my medical decision making (see chart for details).  New onset ascites and peripheral edema.  Likely related to cirrhosis from combination of hepatitis C and alcoholism.  Old records are reviewed confirming hospital admission 1 month ago for upper GI bleeding.  Hemoglobin is stable since discharge.  Chemistry panel shows mild elevation of AST which is actually declining from recent hospitalization, and also severe decrease in albumin.  This probably accounts for her pedal edema.  Of note, CT scan 1 year ago did not show any abnormalities.  Will send for repeat CT scan.  CT scan does show evidence of ascites but no evidence of acute process.  Urinalysis shows evidence of infection and that may be the cause of her abdominal pain.  She is given a dose of  ceftriaxone, and specimen is sent for culture.  Her blood pressures have tended to be soft running in the 90s.  This is comparable to what she was running in the hospital.  I am concerned about starting potent diuretics in that setting.  She will be discharged with prescriptions for cephalexin and spironolactone and referred back to family practice center for follow-up in 1 week.  Return precautions discussed.  Final Clinical Impressions(s) / ED Diagnoses   Final diagnoses:  Lower abdominal pain  Ascites due to alcoholic cirrhosis (HCC)  Urinary tract infection without hematuria, site unspecified  Normochromic normocytic anemia    ED Discharge Orders        Ordered    cephALEXin (KEFLEX) 250 MG capsule  3 times daily     11/09/17 0513    spironolactone (ALDACTONE) 25 MG tablet  Daily     44/81/85 6314       Delora Fuel, MD 97/02/63 0520  Patient had said that she was unable to get an appointment with [facility] until sometime in September.  I have called the resident on call, and patient has an appointment for Thursday, November 16, 2017 at 9 AM with Dr. Maudie Mercury.   Delora Fuel, MD 78/58/85 [phone]

## 2017-11-08 NOTE — ED Triage Notes (Signed)
The pt is c/o abd pain with feet and ankle swelling for 3 weeks since she was discharged from this hospital  She arrived by gems from home to triage from amb.

## 2017-11-09 ENCOUNTER — Emergency Department (HOSPITAL_COMMUNITY): Payer: Medicaid Other

## 2017-11-09 LAB — URINALYSIS, ROUTINE W REFLEX MICROSCOPIC
BILIRUBIN URINE: NEGATIVE
GLUCOSE, UA: NEGATIVE mg/dL
HGB URINE DIPSTICK: NEGATIVE
KETONES UR: NEGATIVE mg/dL
NITRITE: POSITIVE — AB
PROTEIN: 30 mg/dL — AB
Specific Gravity, Urine: 1.019 (ref 1.005–1.030)
pH: 5 (ref 5.0–8.0)

## 2017-11-09 MED ORDER — CEPHALEXIN 250 MG PO CAPS: 250.0000 mg | ORAL_CAPSULE | Freq: Three times a day (TID) | ORAL | 0 refills | Status: DC

## 2017-11-09 MED ORDER — SODIUM CHLORIDE 0.9 % IV SOLN
1.0000 g | Freq: Once | INTRAVENOUS | Status: AC
  Administered 2017-11-09: 1 g via INTRAVENOUS
  Filled 2017-11-09: qty 10

## 2017-11-09 MED ORDER — SPIRONOLACTONE 25 MG PO TABS: 25.0000 mg | ORAL_TABLET | Freq: Every day | ORAL | 0 refills | Status: DC

## 2017-11-09 MED ORDER — IOHEXOL 300 MG/ML  SOLN
100.0000 mL | Freq: Once | INTRAMUSCULAR | Status: AC | PRN
  Administered 2017-11-09: 100 mL via INTRAVENOUS

## 2017-11-09 MED ORDER — IOPAMIDOL (ISOVUE-300) INJECTION 61%
INTRAVENOUS | Status: AC
  Filled 2017-11-09: qty 30

## 2017-11-09 NOTE — Discharge Instructions (Addendum)
Return if you start running a fever, or if your symptoms are getting worse.

## 2017-11-09 NOTE — ED Notes (Signed)
Patient has been made aware that we need a urine specimen.

## 2017-11-09 NOTE — ED Notes (Signed)
ED Provider at bedside. 

## 2017-11-09 NOTE — Progress Notes (Addendum)
Spoke with ED physician Dr. Roxanne Mins.  He stated that patient had recently been discharged from [facility] and told to make an appointment to follow up at the [facility].  She stated that she was unable to schedule a Hospital follow up until September.  The patient has an appointment scheduled with GI in September noted in her chart.  She presented to the ED today with new onset ascites and peripheral edema and was started on aldactone.  Dr. Roxanne Mins contacted our service to set the patient up a follow up visit 1 week post starting aldactone and for hospital follow up from her prior admission.    Appointment made for 11/16/17 at 9:00AM with Dr. Zettie Cooley.  Gregery Na, DO  PGY-1, Family Medicine 5:28 AM

## 2017-11-10 LAB — URINE CULTURE

## 2017-11-16 ENCOUNTER — Inpatient Hospital Stay: Payer: Self-pay | Admitting: Family Medicine

## 2017-11-24 ENCOUNTER — Inpatient Hospital Stay: Payer: Medicaid Other | Admitting: Family Medicine

## 2017-11-25 ENCOUNTER — Other Ambulatory Visit: Payer: Self-pay | Admitting: Family Medicine

## 2017-12-05 ENCOUNTER — Inpatient Hospital Stay: Payer: Medicaid Other | Admitting: Family Medicine

## 2017-12-07 ENCOUNTER — Other Ambulatory Visit: Payer: Self-pay | Admitting: Family Medicine

## 2017-12-19 ENCOUNTER — Telehealth: Payer: Self-pay | Admitting: Gastroenterology

## 2017-12-19 NOTE — Telephone Encounter (Signed)
Patient with ascites and is having some pain.  She would like to be seen earlier.  She will come in and see Ellouise Newer, PA on 12/22/17 11:15

## 2017-12-22 ENCOUNTER — Other Ambulatory Visit: Payer: Self-pay

## 2017-12-22 ENCOUNTER — Other Ambulatory Visit (INDEPENDENT_AMBULATORY_CARE_PROVIDER_SITE_OTHER): Payer: Medicaid Other

## 2017-12-22 ENCOUNTER — Encounter: Payer: Self-pay | Admitting: Physician Assistant

## 2017-12-22 ENCOUNTER — Ambulatory Visit (INDEPENDENT_AMBULATORY_CARE_PROVIDER_SITE_OTHER): Payer: Medicaid Other | Admitting: Physician Assistant

## 2017-12-22 VITALS — BP 80/50 | HR 122 | Ht 67.0 in | Wt 90.0 lb

## 2017-12-22 DIAGNOSIS — R188 Other ascites: Secondary | ICD-10-CM

## 2017-12-22 DIAGNOSIS — K7460 Unspecified cirrhosis of liver: Secondary | ICD-10-CM

## 2017-12-22 DIAGNOSIS — R1084 Generalized abdominal pain: Secondary | ICD-10-CM

## 2017-12-22 DIAGNOSIS — R109 Unspecified abdominal pain: Secondary | ICD-10-CM

## 2017-12-22 DIAGNOSIS — D509 Iron deficiency anemia, unspecified: Secondary | ICD-10-CM | POA: Diagnosis not present

## 2017-12-22 DIAGNOSIS — F1010 Alcohol abuse, uncomplicated: Secondary | ICD-10-CM

## 2017-12-22 LAB — COMPREHENSIVE METABOLIC PANEL
ALK PHOS: 109 U/L (ref 39–117)
ALT: 10 U/L (ref 0–35)
AST: 39 U/L — AB (ref 0–37)
Albumin: 3.1 g/dL — ABNORMAL LOW (ref 3.5–5.2)
BILIRUBIN TOTAL: 1.6 mg/dL — AB (ref 0.2–1.2)
BUN: 2 mg/dL — AB (ref 6–23)
CO2: 24 meq/L (ref 19–32)
CREATININE: 0.57 mg/dL (ref 0.40–1.20)
Calcium: 8.4 mg/dL (ref 8.4–10.5)
Chloride: 96 mEq/L (ref 96–112)
GFR: 118.17 mL/min (ref >60.00)
GLUCOSE: 129 mg/dL — AB (ref 70–99)
Potassium: 3 mEq/L — ABNORMAL LOW (ref 3.5–5.1)
SODIUM: 132 meq/L — AB (ref 135–145)
TOTAL PROTEIN: 7.2 g/dL (ref 6.0–8.3)

## 2017-12-22 LAB — CBC WITH DIFFERENTIAL/PLATELET
BASOS ABS: 0.2 10*3/uL — AB (ref 0.0–0.1)
Basophils Relative: 2 % (ref 0.0–3.0)
Eosinophils Absolute: 0.1 10*3/uL (ref 0.0–0.7)
Eosinophils Relative: 1 % (ref 0.0–5.0)
HCT: 26.7 % — ABNORMAL LOW (ref 36.0–46.0)
Hemoglobin: 8.8 g/dL — ABNORMAL LOW (ref 12.0–15.0)
LYMPHS ABS: 1.7 10*3/uL (ref 0.7–4.0)
Lymphocytes Relative: 15.9 % (ref 12.0–46.0)
MCHC: 32.9 g/dL (ref 30.0–36.0)
MCV: 88.8 fl (ref 78.0–100.0)
MONO ABS: 1.7 10*3/uL — AB (ref 0.1–1.0)
Monocytes Relative: 15.9 % — ABNORMAL HIGH (ref 3.0–12.0)
NEUTROS PCT: 65.2 % (ref 43.0–77.0)
Neutro Abs: 6.8 10*3/uL (ref 1.4–7.7)
Platelets: 345 10*3/uL (ref 150.0–400.0)
RBC: 3 Mil/uL — AB (ref 3.87–5.11)
RDW: 20.3 % — ABNORMAL HIGH (ref 11.5–15.5)
WBC: 10.4 10*3/uL (ref 4.0–10.5)

## 2017-12-22 LAB — IBC PANEL
Iron: 29 ug/dL — ABNORMAL LOW (ref 42–145)
SATURATION RATIOS: 9.5 % — AB (ref 20.0–50.0)
TRANSFERRIN: 217 mg/dL (ref 212.0–360.0)

## 2017-12-22 LAB — FERRITIN: Ferritin: 16.1 ng/mL (ref 10.0–291.0)

## 2017-12-22 LAB — PROTIME-INR
INR: 1.2 ratio — AB (ref 0.8–1.0)
PROTHROMBIN TIME: 13.7 s — AB (ref 9.6–13.1)

## 2017-12-22 MED ORDER — AMBULATORY NON FORMULARY MEDICATION: 3 refills | Status: DC

## 2017-12-22 MED ORDER — PANTOPRAZOLE SODIUM 40 MG PO TBEC: 40.0000 mg | DELAYED_RELEASE_TABLET | Freq: Two times a day (BID) | ORAL | 1 refills | Status: DC

## 2017-12-22 NOTE — Progress Notes (Signed)
Chief Complaint: Follow-up hospitalization for FOBT positive stool and anemia  HPI:    Debbie Grimes is a 52 year old female with a past medical history of alcoholism, COPD, bipolar 1 disorder, hepatitis C and others listed below, who was seen by Dr. Fuller Plan in the hospital and presents to clinic today for follow-up.    10/10/2017 counseled in the hospital for FOBT positive stool and anemia.  At that time had presented with increased falls and easy bruising, also history of Ibuprofen use and weight loss.  At admission hemoglobin was 7 with a decrease to 6.5.  Iron elevated at 232, TIBC normal, iron saturation elevated at 92 and ferritin 45.  Platelets, PT/INR were normal.  At that time patient was diagnosed with acute on chronic anemia as well as chronic alcoholism and elevated LFTs thought likely alcoholic fatty liver disease.  She was arranged for EGD and colonoscopy.    10/11/2017 colonoscopy with one 14 mm polyp at the ileocecal valve removed with a hot snare, one 6 mm polyp in the ascending colon removed with a cold snare, 2 nonbleeding colonic angiodysplastic lesions, one 10 mm polyp in the transverse colon removed with a hot snare, internal hemorrhoids and otherwise normal.  Repeat was recommended in 3 years.  Pathology showed tubular adenomas negative for high-grade dysplasia.  EGD that day showed salmon-colored mucosa suggestive of long segment Barrett's esophagus, small hiatal hernia normal duodenal bulb and second portion of duodenum.  She was told to be on Omeprazole 40 mg p.o. indefinitely.  Pathology showed Barrett's esophagus negative for dysplasia.  Repeat was recommended in 3 years.    11/08/2017 ED visit for abdominal pain.  At that time labs showed potassium 3.4, AST is 75.  CBC with white count 12.4 hemoglobin of 7.8.  CT abdomen pelvis showed thickening of the proximal colon concerning for colitis, small to moderate ascites, possible minimal nodularity of the liver contour which may  represent early changes of cirrhosis, gallstone and a small nonobstructing left renal interpolar calculus and aortic atherosclerosis.  She was diagnosed with new onset ascites and peripheral edema likely related to cirrhosis from combination of hep C and alcoholism.  There was severe decrease in her albumin which is thought contributory to her pedal edema.  Urinalysis also showed evidence of infection which thought contributory to abdominal pain.  She was given a dose of ceftriaxone.  Her blood pressure was running soft in the 90s which was comparable it was running in the hospital and there is concerned about starting potent diuretics.  She was given prescriptions for cephalexin and spironolactone 25 mg daily.    Today, patient presents to clinic accompanied by her friend and describes that she continues to feel weak although she cannot remember the last time that she felt well.  She has continued with abdominal pain which is all over her abdomen and very tender to touch.  This has continued since being seen in the ED above.  Also notes that she continues with abdominal distention which she thinks may be slightly more than before.  Denies nausea, vomiting.        Does continue with some heartburn irregardless of using her Pantoprazole 40 mg daily.    Also continues with some bright red blood on the toilet paper on wiping.    Social history positive for continuing to drink "a little bit", notes 1-2 beers a day at least.    Denies fever, chills or symptoms that awaken her from sleep.  Past Medical History:  Diagnosis Date  . Alcoholism ([place]) 2000   chronic  . Bipolar 1 disorder ([place]) 2010   with depression and anxiety  . COPD (chronic obstructive pulmonary disease) ([place])   . Falls frequently   . Hepatic steatosis 04/6107   alcoholic fatty liver.  Metavir fibrosis score F 2 with some F3  . Hepatitis C   . Hepatitis C antibody test positive 09/2015   HCV quant: no virus detected.   . Polysubstance  abuse ([place]) 2000  . Protein-calorie malnutrition ([place]) 2012  . Symptomatic anemia 10/09/2017    Past Surgical History:  Procedure Laterality Date  . BIOPSY  10/11/2017   Procedure: BIOPSY;  Surgeon: Ladene Artist, MD;  Location: [place];  Service: Endoscopy;;  . COLONOSCOPY WITH PROPOFOL N/A 10/11/2017   Procedure: COLONOSCOPY WITH PROPOFOL;  Surgeon: Ladene Artist, MD;  Location: [facility] ENDOSCOPY;  Service: Endoscopy;  Laterality: N/A;  . ESOPHAGOGASTRODUODENOSCOPY (EGD) WITH PROPOFOL N/A 10/11/2017   Procedure: ESOPHAGOGASTRODUODENOSCOPY (EGD) WITH PROPOFOL;  Surgeon: Ladene Artist, MD;  Location: [facility] ENDOSCOPY;  Service: Endoscopy;  Laterality: N/A;  . hallux laceration, repair distal phalanx fracture Left 01/2011   lawn mower injury.  . IRRIGATION AND DEBRIDEMENT SEBACEOUS CYST  06/2003   nasal   . POLYPECTOMY  10/11/2017   Procedure: POLYPECTOMY;  Surgeon: Ladene Artist, MD;  Location: [facility] ENDOSCOPY;  Service: Endoscopy;;  . TUBAL LIGATION      Current Outpatient Medications  Medication Sig Dispense Refill  . acetaminophen (TYLENOL) 500 MG tablet Take 2 tablets (1,000 mg total) by mouth every 6 (six) hours as needed for mild pain. 30 tablet 0  . cephALEXin (KEFLEX) 250 MG capsule Take 1 capsule (250 mg total) by mouth 3 (three) times daily. 30 capsule 0  . diphenhydramine-acetaminophen (TYLENOL PM) 25-500 MG TABS tablet Take 1 tablet by mouth at bedtime as needed (sleep).    . folic acid (FOLVITE) 1 MG tablet TAKE 1 TABLET BY MOUTH EVERY DAY 30 tablet 0  . pantoprazole (PROTONIX) 40 MG tablet TAKE 1 TABLET BY MOUTH EVERY DAY 30 tablet 0  . spironolactone (ALDACTONE) 25 MG tablet Take 1 tablet (25 mg total) by mouth daily. 90 tablet 0  . Tiotropium Bromide-Olodaterol (STIOLTO RESPIMAT) 2.5-2.5 MCG/ACT AERS Inhale 1 Inhaler into the lungs daily as needed (SOB).      No current facility-administered medications for this visit.     Allergies as of 12/22/2017  . (No Known  Allergies)    No family history on file.  Social History   Socioeconomic History  . Marital status: Single    Spouse name: Not on file  . Number of children: Not on file  . Years of education: Not on file  . Highest education level: Not on file  Occupational History  . Not on file  Social Needs  . Financial resource strain: Not on file  . Food insecurity:    Worry: Not on file    Inability: Not on file  . Transportation needs:    Medical: Not on file    Non-medical: Not on file  Tobacco Use  . Smoking status: Current Every Day Smoker    Packs/day: 1.00    Years: 39.00    Pack years: 39.00    Types: Cigarettes  . Smokeless tobacco: Never Used  . Tobacco comment: 2-3 packs/day since age 84  Substance and Sexual Activity  . Alcohol use: Not Currently    Alcohol/week: 14.0 standard drinks  Types: 14 Cans of beer per week    Comment: 09/08/2017 "22oz beer qd"  . Drug use: Never  . Sexual activity: Not Currently    Birth control/protection: None  Lifestyle  . Physical activity:    Days per week: Not on file    Minutes per session: Not on file  . Stress: Not on file  Relationships  . Social connections:    Talks on phone: Not on file    Gets together: Not on file    Attends religious service: Not on file    Active member of club or organization: Not on file    Attends meetings of clubs or organizations: Not on file    Relationship status: Not on file  . Intimate partner violence:    Fear of current or ex partner: Not on file    Emotionally abused: Not on file    Physically abused: Not on file    Forced sexual activity: Not on file  Other Topics Concern  . Not on file  Social History Narrative   Used to work in [employer] though unemployed since age 56 due to feet pain   Spends most of her days on a recliner watching TV and drinking beverages   Lives with a female partner        Review of Systems:    Constitutional: No weight loss, fever or chills Skin: No  rash  Cardiovascular: No chest pain Respiratory: No SOB Gastrointestinal: See HPI and otherwise negative   Physical Exam:  Vital signs: BP (!) 80/50   Pulse (!) 122   Ht 5\' 7"  (1.702 m)   Wt 90 lb (40.8 kg)   BMI 14.10 kg/m   Constitutional:  Chronically ill appearing, thin, cachectic Caucasian female appears to be in NAD,alert and cooperative Head:  Normocephalic and atraumatic. Eyes:   PEERL, EOMI. No icterus. Conjunctiva pink. Ears:  Normal auditory acuity. Neck:  Supple Throat: Oral cavity and pharynx without inflammation, swelling or lesion.  Respiratory: Respirations even and unlabored. Lungs clear to auscultation bilaterally.   No wheezes, crackles, or rhonchi.  Cardiovascular: Normal S1, S2. No MRG. Regular rate and rhythm. No peripheral edema, cyanosis or pallor.  Gastrointestinal:  Soft, +moderate distension, Marked generalized ttp to only light palpation, +guarding Normal bowel sounds. No appreciable masses or hepatomegaly. Rectal:  Not performed.  Msk:  Symmetrical without gross deformities. Without edema, no deformity or joint abnormality.  Neurologic:  Alert and  oriented x4;  grossly normal neurologically.  Skin:   Dry and intact without significant lesions or rashes.thin Psychiatric: Demonstrates good judgement and reason without abnormal affect or behaviors.  RELEVANT LABS AND IMAGING: CBC    Component Value Date/Time   WBC 12.4 (H) 11/08/2017 1748   RBC 2.60 (L) 11/08/2017 1748   HGB 7.8 (L) 11/08/2017 1748   HCT 25.1 (L) 11/08/2017 1748   PLT 393 11/08/2017 1748   MCV 96.5 11/08/2017 1748   MCH 30.0 11/08/2017 1748   MCHC 31.1 11/08/2017 1748   RDW 21.2 (H) 11/08/2017 1748   LYMPHSABS 1.0 10/10/2017 0504   MONOABS 0.7 10/10/2017 0504   EOSABS 0.0 10/10/2017 0504   BASOSABS 0.0 10/10/2017 0504    CMP     Component Value Date/Time   NA 137 11/08/2017 1748   K 3.4 (L) 11/08/2017 1748   CL 106 11/08/2017 1748   CO2 22 11/08/2017 1748   GLUCOSE 92  11/08/2017 1748   BUN <5 (L) 11/08/2017 1748   CREATININE 0.80  11/08/2017 1748   CREATININE 0.62 07/10/2015 1156   CALCIUM 8.0 (L) 11/08/2017 1748   PROT 5.7 (L) 11/08/2017 1748   ALBUMIN 2.1 (L) 11/08/2017 1748   AST 75 (H) 11/08/2017 1748   ALT 18 11/08/2017 1748   ALKPHOS 102 11/08/2017 1748   BILITOT 1.0 11/08/2017 1748   GFRNONAA >60 11/08/2017 1748   GFRNONAA >89 07/10/2015 1156   GFRAA >60 11/08/2017 1748   GFRAA >89 07/10/2015 1156    Assessment: 1.  Cirrhosis with ascites: Most recent CT 11/08/2017 now showing some nodularity to the patient's liver suggesting early cirrhosis, she does have known history of hep C and alcohol abuse which are likely the cause of this, but will also consider autoimmune versus other causes today, also patient with some abdominal distention, some better with Aldactone 25 mg daily, unable to tolerate higher dose of diuretics due to hypotension 2.  Alcohol abuse 3.  Hepatitis C: Antibody positive but HCV quant of negative in 2017 suggesting false positive/prior infection 4.  Anemia: Chronic for the patient, recent EGD and colonoscopy during hospitalization 10/11/2017, see HPI, continues with a small amount of bright red blood on wiping likely from hemorrhoids  Plan: 1.  Recheck CBC and CMP today.  Also added liver serologies to consider autoimmune cause of liver disease though likely this is from alcoholism and hep C. 2.  Ordered ultrasound-guided paracentesis with fluid for cytology, cell differential and culture as well as protein level 3.  Recommend patient continue her Aldactone 25 mg daily.  We did discuss that she would not be able to tolerate additional diuretics as her blood pressure is so low. 4.  Would recommend a less than 2 g sodium diet.  Provided handout on this and discussed in detail. 5.  Recommend patient check weight daily. 6.  Increased patient's Pantoprazole to 40 mg twice daily, 30-60 minutes before breakfast and dinner to assist with  continued heartburn symptoms. 7.  Recommend complete abstinence from alcohol. 8.  Suggested patient supplement her diet with Boost/Ensure shakes as her albumin is low and she needs to make sure she is taking in enough protein. 9.  [place] screening is up-to-date with recent CT in July.  Recent EGD and colonoscopy.  Repeat EGD recommended in 2 years for variceal screening and history of Barrett's.  Colonoscopy recommended in 3 years for history of tubular adenomas. 10.  Patient will follow in clinic with Dr. Fuller Plan at his next available appointment.  Ellouise Newer, PA-C [facility] 12/22/2017, 10:40 AM  Cc: No ref. provider found

## 2017-12-22 NOTE — Patient Instructions (Signed)
If you are age 52 or older, your body mass index should be between 23-30. Your Body mass index is 14.1 kg/m. If this is out of the aforementioned range listed, please consider follow up with your Primary Care Provider.  If you are age 39 or younger, your body mass index should be between 19-25. Your Body mass index is 14.1 kg/m. If this is out of the aformentioned range listed, please consider follow up with your Primary Care Provider.   We have sent the following medications to your pharmacy for you to pick up at your convenience: Pantoprazole 40 mg twice daily.  Your provider has requested that you go to the basement level for lab work before leaving today. Press "B" on the elevator. The lab is located at the first door on the left as you exit the elevator.  Start Boost/Ensure/Carnation breakfast shakes.  Start 2 gram Low Sodium Diet below.  You have been scheduled for an US guided paracentesis at [facility] on December 27, 2017 at 10:30 am. Arrive 15 minutes early.  Thank you for choosing me and [facility].   Ellouise Newer, PA-C   Two Gram Sodium Diet 2000 mg  What is Sodium? Sodium is a mineral found naturally in many foods. The most significant source of sodium in the diet is table salt, which is about 40% sodium.  Processed, convenience, and preserved foods also contain a large amount of sodium.  The body needs only 500 mg of sodium daily to function,  A normal diet provides more than enough sodium even if you do not use salt.  Why Limit Sodium? A build up of sodium in the body can cause thirst, increased blood pressure, shortness of breath, and water retention.  Decreasing sodium in the diet can reduce edema and risk of heart attack or stroke associated with high blood pressure.  Keep in mind that there are many other factors involved in these health problems.  Heredity, obesity, lack of exercise, cigarette smoking, stress and what you eat all play a  role.  General Guidelines:  Do not add salt at the table or in cooking.  One teaspoon of salt contains over 2 grams of sodium.  Read food labels  Avoid processed and convenience foods  Ask your dietitian before eating any foods not dicussed in the menu planning guidelines  Consult your physician if you wish to use a salt substitute or a sodium containing medication such as antacids.  Limit milk and milk products to 16 oz (2 cups) per day.  Shopping Hints:  READ LABELS!! "Dietetic" does not necessarily mean low sodium.  Salt and other sodium ingredients are often added to foods during processing.   Menu Planning Guidelines Food Group Choose More Often Avoid  Beverages (see also the milk group All fruit juices, low-sodium, salt-free vegetables juices, low-sodium carbonated beverages Regular vegetable or tomato juices, commercially softened water used for drinking or cooking  Breads and Cereals Enriched white, wheat, rye and pumpernickel bread, hard rolls and dinner rolls; muffins, cornbread and waffles; most dry cereals, cooked cereal without added salt; unsalted crackers and breadsticks; low sodium or homemade bread crumbs Bread, rolls and crackers with salted tops; quick breads; instant hot cereals; pancakes; commercial bread stuffing; self-rising flower and biscuit mixes; regular bread crumbs or cracker crumbs  Desserts and Sweets Desserts and sweets mad with mild should be within allowance Instant pudding mixes and cake mixes  Fats Butter or margarine; vegetable oils; unsalted salad dressings, regular salad  dressings limited to 1 Tbs; light, sour and heavy cream Regular salad dressings containing bacon fat, bacon bits, and salt pork; snack dips made with instant soup mixes or processed cheese; salted nuts  Fruits Most fresh, frozen and canned fruits Fruits processed with salt or sodium-containing ingredient (some dried fruits are processed with sodium sulfites        Vegetables  Fresh, frozen vegetables and low- sodium canned vegetables Regular canned vegetables, sauerkraut, pickled vegetables, and others prepared in brine; frozen vegetables in sauces; vegetables seasoned with ham, bacon or salt pork  Condiments, Sauces, Miscellaneous  Salt substitute with physician's approval; pepper, herbs, spices; vinegar, lemon or lime juice; hot pepper sauce; garlic powder, onion powder, low sodium soy sauce (1 Tbs.); low sodium condiments (ketchup, chili sauce, mustard) in limited amounts (1 tsp.) fresh ground horseradish; unsalted tortilla chips, pretzels, potato chips, popcorn, salsa (1/4 cup) Any seasoning made with salt including garlic salt, celery salt, onion salt, and seasoned salt; sea salt, rock salt, kosher salt; meat tenderizers; monosodium glutamate; mustard, regular soy sauce, barbecue, sauce, chili sauce, teriyaki sauce, steak sauce, Worcestershire sauce, and most flavored vinegars; canned gravy and mixes; regular condiments; salted snack foods, olives, picles, relish, horseradish sauce, catsup   Food preparation: Try these seasonings Meats:    Pork Sage, onion Serve with applesauce  Chicken Poultry seasoning, thyme, parsley Serve with cranberry sauce  Lamb Curry powder, rosemary, garlic, thyme Serve with mint sauce or jelly  Veal Marjoram, basil Serve with current jelly, cranberry sauce  Beef Pepper, bay leaf Serve with dry mustard, unsalted chive butter  Fish [place] leaf, dill Serve with unsalted lemon butter, unsalted parsley butter  Vegetables:    Asparagus Lemon juice   Broccoli Lemon juice   Carrots Mustard dressing parsley, mint, nutmeg, glazed with unsalted butter and sugar   Green beans Marjoram, lemon juice, nutmeg,dill seed   Tomatoes Basil, marjoram, onion   Spice /blend for [employer]" 4 tsp ground thyme 1 tsp ground sage 3 tsp ground rosemary 4 tsp ground marjoram   Test your knowledge 1. A product that says "Salt Free" may still contain sodium. True  or False 2. Garlic Powder and Hot Pepper Sauce an be used as alternative seasonings.True or False 3. Processed foods have more sodium than fresh foods.  True or False 4. Canned Vegetables have less sodium than froze True or False  WAYS TO DECREASE YOUR SODIUM INTAKE 1. Avoid the use of added salt in cooking and at the table.  Table salt (and other prepared seasonings which contain salt) is probably one of the greatest sources of sodium in the diet.  Unsalted foods can gain flavor from the sweet, sour, and butter taste sensations of herbs and spices.  Instead of using salt for seasoning, try the following seasonings with the foods listed.  Remember: how you use them to enhance natural food flavors is limited only by your creativity... Allspice-Meat, fish, eggs, fruit, peas, red and yellow vegetables Almond Extract-Fruit baked goods Anise Seed-Sweet breads, fruit, carrots, beets, cottage cheese, cookies (tastes like licorice) Basil-Meat, fish, eggs, vegetables, rice, vegetables salads, soups, sauces Bay Leaf-Meat, fish, stews, poultry Burnet-Salad, vegetables (cucumber-like flavor) Caraway Seed-Bread, cookies, cottage cheese, meat, vegetables, cheese, rice Cardamon-Baked goods, fruit, soups Celery Powder or seed-Salads, salad dressings, sauces, meatloaf, soup, bread.Do not use  celery salt Chervil-Meats, salads, fish, eggs, vegetables, cottage cheese (parsley-like flavor) Chili Power-Meatloaf, chicken cheese, corn, eggplant, egg dishes Chives-Salads cottage cheese, egg dishes, soups, vegetables, sauces Cilantro-Salsa, casseroles Cinnamon-Baked  goods, fruit, pork, lamb, chicken, carrots Cloves-Fruit, baked goods, fish, pot roast, green beans, beets, carrots Coriander-Pastry, cookies, meat, salads, cheese (lemon-orange flavor) Cumin-Meatloaf, fish,cheese, eggs, cabbage,fruit pie (caraway flavor) [employer], fruit, eggs, fish, poultry, cottage cheese, vegetables Dill Seed-Meat, cottage  cheese, poultry, vegetables, fish, salads, bread Fennel Seed-Bread, cookies, apples, pork, eggs, fish, beets, cabbage, cheese, Licorice-like flavor Garlic-(buds or powder) Salads, meat, poultry, fish, bread, butter, vegetables, potatoes.Do not  use garlic salt Ginger-Fruit, vegetables, baked goods, meat, fish, poultry Horseradish Root-Meet, vegetables, butter Lemon Juice or Extract-Vegetables, fruit, tea, baked goods, fish salads Mace-Baked goods fruit, vegetables, fish, poultry (taste like nutmeg) Maple Extract-Syrups Marjoram-Meat, chicken, fish, vegetables, breads, green salads (taste like Sage) Mint-Tea, lamb, sherbet, vegetables, desserts, carrots, cabbage Mustard, Dry or Seed-Cheese, eggs, meats, vegetables, poultry Nutmeg-Baked goods, fruit, chicken, eggs, vegetables, desserts Onion Powder-Meat, fish, poultry, vegetables, cheese, eggs, bread, rice salads (Do not use   Onion salt) Orange Extract-Desserts, baked goods Oregano-Pasta, eggs, cheese, onions, pork, lamb, fish, chicken, vegetables, green salads Paprika-Meat, fish, poultry, eggs, cheese, vegetables Parsley Flakes-Butter, vegetables, meat fish, poultry, eggs, bread, salads (certain forms may   Contain sodium Pepper-Meat fish, poultry, vegetables, eggs Peppermint Extract-Desserts, baked goods Poppy Seed-Eggs, bread, cheese, fruit dressings, baked goods, noodles, vegetables, cottage  [employer], poultry, meat, fish, cauliflower, turnips,eggs bread Saffron-Rice, bread, veal, chicken, fish, eggs Sage-Meat, fish, poultry, onions, eggplant, tomateos, pork, stews Savory-Eggs, salads, poultry, meat, rice, vegetables, soups, pork Tarragon-Meat, poultry, fish, eggs, butter, vegetables (licorice-like flavor)  Thyme-Meat, poultry, fish, eggs, vegetables, (clover-like flavor), sauces, soups Tumeric-Salads, butter, eggs, fish, rice, vegetables (saffron-like flavor) Vanilla Extract-Baked goods,  candy Vinegar-Salads, vegetables, meat marinades Walnut Extract-baked goods, candy  2. Choose your Foods Wisely   The following is a list of foods to avoid which are high in sodium:  Meats-Avoid all smoked, canned, salt cured, dried and kosher meat and fish as well as Anchovies   [employer] meats:Bologna, Liverwurst, Pastrami Canned meat or fish  Marinated herring Caviar    Pepperoni Corned Beef   Pizza Dried chipped beef  Salami Frozen breaded fish or meat Salt pork Frankfurters or hot dogs  Sardines Gefilte fish   Sausage Ham (boiled ham, Proscuitto Smoked butt    spiced ham)   Spam      TV Dinners Vegetables Canned vegetables (Regular) Relish Canned mushrooms  Sauerkraut Olives    Tomato juice Pickles  Bakery and Dessert Products Canned puddings  Cream pies Cheesecake   Decorated cakes Cookies  Beverages/Juices Tomato juice, regular  Gatorade   V-8 vegetable juice, regular  Breads and Cereals Biscuit mixes   Salted potato chips, corn chips, pretzels Bread stuffing mixes  Salted crackers and rolls Pancake and waffle mixes Self-rising flour  Seasonings Accent    Meat sauces Barbecue sauce  Meat tenderizer Catsup    Monosodium glutamate (MSG) Celery salt   Onion salt Chili sauce   Prepared mustard Garlic salt   Salt, seasoned salt, sea salt Gravy mixes   Soy sauce Horseradish   Steak sauce Ketchup   Tartar sauce Lite salt    Teriyaki sauce Marinade mixes   Worcestershire sauce  Others Baking powder   Cocoa and cocoa mixes Baking soda   Commercial casserole mixes Candy-caramels, chocolate  Dehydrated soups    Bars, fudge,nougats  Instant rice and pasta mixes Canned broth or soup  Maraschino cherries Cheese, aged and processed cheese and cheese spreads  Learning Assessment Quiz  Indicated T (for True) or F (  for False) for each of the following statements:  1. _____ Fresh fruits and vegetables and unprocessed grains are generally low in  sodium 2. _____ Water may contain a considerable amount of sodium, depending on the source 3. _____ You can always tell if a food is high in sodium by tasting it 4. _____ Certain laxatives my be high in sodium and should be avoided unless prescribed   by a physician or pharmacist 5. _____ Salt substitutes may be used freely by anyone on a sodium restricted diet 6. _____ Sodium is present in table salt, food additives and as a natural component of   most foods 7. _____ Table salt is approximately 90% sodium 8. _____ Limiting sodium intake may help prevent excess fluid accumulation in the body 9. _____ On a sodium-restricted diet, seasonings such as bouillon soy sauce, and    cooking wine should be used in place of table salt 10. _____ On an ingredient list, a product which lists monosodium glutamate as the first   ingredient is an appropriate food to include on a low sodium diet  Circle the best answer(s) to the following statements (Hint: there may be more than one correct answer)  11. On a low-sodium diet, some acceptable snack items are:    A. Olives  F. Bean dip   K. Grapefruit juice    B. Salted Pretzels G. Commercial Popcorn   L. Canned peaches    C. Carrot Sticks  H. Bouillon   M. Unsalted nuts   D. [place] fries  I. Peanut butter crackers N. Salami   E. Sweet pickles J. Tomato Juice   O. Pizza  12.  Seasonings that may be used freely on a reduced - sodium diet include   A. Lemon wedges F.Monosodium glutamate K. Celery seed    B.Soysauce   G. Pepper   L. Mustard powder   C. Sea salt  H. Cooking wine  M. Onion flakes   D. Vinegar  E. Prepared horseradish N. Salsa   E. Sage   J. Worcestershire sauce  O. Chutney

## 2017-12-26 ENCOUNTER — Other Ambulatory Visit: Payer: Self-pay

## 2017-12-26 DIAGNOSIS — D649 Anemia, unspecified: Secondary | ICD-10-CM

## 2017-12-26 DIAGNOSIS — K7460 Unspecified cirrhosis of liver: Secondary | ICD-10-CM

## 2017-12-26 DIAGNOSIS — R188 Other ascites: Principal | ICD-10-CM

## 2017-12-26 MED ORDER — POTASSIUM CHLORIDE ER 20 MEQ PO TBCR: 20.0000 meq | EXTENDED_RELEASE_TABLET | Freq: Every day | ORAL | 0 refills | Status: DC

## 2017-12-26 NOTE — Progress Notes (Signed)
Reviewed and agree with management plan.  Sharlett Lienemann T. Jerolene Kupfer, MD FACG 

## 2017-12-27 ENCOUNTER — Ambulatory Visit (HOSPITAL_COMMUNITY): Payer: Medicaid Other

## 2017-12-27 ENCOUNTER — Other Ambulatory Visit: Payer: Self-pay | Admitting: Family Medicine

## 2017-12-27 LAB — ANTI-SMOOTH MUSCLE ANTIBODY, IGG

## 2017-12-27 LAB — MITOCHONDRIAL ANTIBODIES

## 2017-12-27 LAB — CERULOPLASMIN: CERULOPLASMIN: 40 mg/dL (ref 18–53)

## 2017-12-27 LAB — ALPHA-1-ANTITRYPSIN: A-1 Antitrypsin, Ser: 231 mg/dL — ABNORMAL HIGH (ref 83–199)

## 2017-12-27 LAB — ANA: Anti Nuclear Antibody(ANA): NEGATIVE

## 2017-12-28 ENCOUNTER — Inpatient Hospital Stay: Payer: Medicaid Other | Admitting: Family Medicine

## 2017-12-29 ENCOUNTER — Ambulatory Visit (HOSPITAL_COMMUNITY)
Admission: RE | Admit: 2017-12-29 | Discharge: 2017-12-29 | Disposition: A | Discharge status: Home / Self Care | Payer: Medicaid Other | Source: Ambulatory Visit | Attending: Physician Assistant | Admitting: Physician Assistant

## 2017-12-29 DIAGNOSIS — R1084 Generalized abdominal pain: Secondary | ICD-10-CM | POA: Diagnosis not present

## 2017-12-29 DIAGNOSIS — K7460 Unspecified cirrhosis of liver: Secondary | ICD-10-CM

## 2017-12-29 DIAGNOSIS — R188 Other ascites: Secondary | ICD-10-CM | POA: Diagnosis not present

## 2017-12-29 LAB — PROTEIN, PLEURAL OR PERITONEAL FLUID

## 2017-12-29 LAB — BODY FLUID CELL COUNT WITH DIFFERENTIAL
Lymphs, Fluid: 70 %
MONOCYTE-MACROPHAGE-SEROUS FLUID: 14 % — AB (ref 50–90)
Neutrophil Count, Fluid: 16 % (ref 0–25)
Total Nucleated Cell Count, Fluid: 234 cu mm (ref 0–1000)

## 2017-12-29 MED ORDER — LIDOCAINE HCL 1 % IJ SOLN
INTRAMUSCULAR | Status: AC
  Filled 2017-12-29: qty 10

## 2017-12-29 NOTE — Procedures (Signed)
Ultrasound-guided diagnostic and therapeutic paracentesis performed yielding 1.7 liters of clear, yellow fluid. No immediate complications.  A portion of the fluid was submitted to the lab for preordered studies.

## 2018-01-01 LAB — BODY FLUID CULTURE
Culture: NO GROWTH
GRAM STAIN: NONE SEEN

## 2018-01-03 ENCOUNTER — Ambulatory Visit: Payer: Self-pay | Admitting: Gastroenterology

## 2018-01-22 ENCOUNTER — Inpatient Hospital Stay (HOSPITAL_COMMUNITY): Payer: Medicaid Other

## 2018-01-22 ENCOUNTER — Encounter (HOSPITAL_COMMUNITY): Payer: Self-pay

## 2018-01-22 ENCOUNTER — Other Ambulatory Visit: Payer: Self-pay

## 2018-01-22 ENCOUNTER — Inpatient Hospital Stay (HOSPITAL_COMMUNITY)
Admission: EM | Admit: 2018-01-22 | Discharge: 2018-01-28 | DRG: 380 | Disposition: A | Discharge status: Hospice | Payer: Medicaid Other | Attending: Internal Medicine | Admitting: Internal Medicine

## 2018-01-22 DIAGNOSIS — K920 Hematemesis: Secondary | ICD-10-CM

## 2018-01-22 DIAGNOSIS — F319 Bipolar disorder, unspecified: Secondary | ICD-10-CM | POA: Diagnosis present

## 2018-01-22 DIAGNOSIS — L89152 Pressure ulcer of sacral region, stage 2: Secondary | ICD-10-CM | POA: Diagnosis present

## 2018-01-22 DIAGNOSIS — F1010 Alcohol abuse, uncomplicated: Secondary | ICD-10-CM | POA: Diagnosis not present

## 2018-01-22 DIAGNOSIS — Z9119 Patient's noncompliance with other medical treatment and regimen: Secondary | ICD-10-CM

## 2018-01-22 DIAGNOSIS — L8990 Pressure ulcer of unspecified site, unspecified stage: Secondary | ICD-10-CM

## 2018-01-22 DIAGNOSIS — K3189 Other diseases of stomach and duodenum: Secondary | ICD-10-CM | POA: Diagnosis present

## 2018-01-22 DIAGNOSIS — N179 Acute kidney failure, unspecified: Secondary | ICD-10-CM | POA: Diagnosis present

## 2018-01-22 DIAGNOSIS — R791 Abnormal coagulation profile: Secondary | ICD-10-CM | POA: Diagnosis present

## 2018-01-22 DIAGNOSIS — R296 Repeated falls: Secondary | ICD-10-CM | POA: Diagnosis present

## 2018-01-22 DIAGNOSIS — E872 Acidosis, unspecified: Secondary | ICD-10-CM | POA: Diagnosis present

## 2018-01-22 DIAGNOSIS — K766 Portal hypertension: Secondary | ICD-10-CM | POA: Diagnosis present

## 2018-01-22 DIAGNOSIS — K7031 Alcoholic cirrhosis of liver with ascites: Secondary | ICD-10-CM | POA: Diagnosis present

## 2018-01-22 DIAGNOSIS — J449 Chronic obstructive pulmonary disease, unspecified: Secondary | ICD-10-CM | POA: Diagnosis present

## 2018-01-22 DIAGNOSIS — K2211 Ulcer of esophagus with bleeding: Principal | ICD-10-CM | POA: Diagnosis present

## 2018-01-22 DIAGNOSIS — K7460 Unspecified cirrhosis of liver: Secondary | ICD-10-CM

## 2018-01-22 DIAGNOSIS — K2210 Ulcer of esophagus without bleeding: Secondary | ICD-10-CM | POA: Diagnosis not present

## 2018-01-22 DIAGNOSIS — Z515 Encounter for palliative care: Secondary | ICD-10-CM | POA: Diagnosis not present

## 2018-01-22 DIAGNOSIS — K700 Alcoholic fatty liver: Secondary | ICD-10-CM | POA: Diagnosis present

## 2018-01-22 DIAGNOSIS — I472 Ventricular tachycardia: Secondary | ICD-10-CM | POA: Diagnosis not present

## 2018-01-22 DIAGNOSIS — F17210 Nicotine dependence, cigarettes, uncomplicated: Secondary | ICD-10-CM | POA: Diagnosis present

## 2018-01-22 DIAGNOSIS — D62 Acute posthemorrhagic anemia: Secondary | ICD-10-CM | POA: Diagnosis present

## 2018-01-22 DIAGNOSIS — Z7189 Other specified counseling: Secondary | ICD-10-CM

## 2018-01-22 DIAGNOSIS — E871 Hypo-osmolality and hyponatremia: Secondary | ICD-10-CM | POA: Diagnosis present

## 2018-01-22 DIAGNOSIS — K7011 Alcoholic hepatitis with ascites: Secondary | ICD-10-CM | POA: Diagnosis present

## 2018-01-22 DIAGNOSIS — I351 Nonrheumatic aortic (valve) insufficiency: Secondary | ICD-10-CM | POA: Diagnosis not present

## 2018-01-22 DIAGNOSIS — Z9911 Dependence on respirator [ventilator] status: Secondary | ICD-10-CM | POA: Diagnosis not present

## 2018-01-22 DIAGNOSIS — K2270 Barrett's esophagus without dysplasia: Secondary | ICD-10-CM | POA: Diagnosis present

## 2018-01-22 DIAGNOSIS — B1920 Unspecified viral hepatitis C without hepatic coma: Secondary | ICD-10-CM | POA: Diagnosis present

## 2018-01-22 DIAGNOSIS — R64 Cachexia: Secondary | ICD-10-CM | POA: Diagnosis present

## 2018-01-22 DIAGNOSIS — I4729 Other ventricular tachycardia: Secondary | ICD-10-CM

## 2018-01-22 DIAGNOSIS — E876 Hypokalemia: Secondary | ICD-10-CM | POA: Diagnosis not present

## 2018-01-22 DIAGNOSIS — D649 Anemia, unspecified: Secondary | ICD-10-CM

## 2018-01-22 DIAGNOSIS — E43 Unspecified severe protein-calorie malnutrition: Secondary | ICD-10-CM | POA: Diagnosis present

## 2018-01-22 DIAGNOSIS — R109 Unspecified abdominal pain: Secondary | ICD-10-CM

## 2018-01-22 DIAGNOSIS — E8770 Fluid overload, unspecified: Secondary | ICD-10-CM | POA: Diagnosis not present

## 2018-01-22 DIAGNOSIS — F1290 Cannabis use, unspecified, uncomplicated: Secondary | ICD-10-CM | POA: Diagnosis present

## 2018-01-22 DIAGNOSIS — F1020 Alcohol dependence, uncomplicated: Secondary | ICD-10-CM | POA: Diagnosis present

## 2018-01-22 DIAGNOSIS — Z681 Body mass index (BMI) 19 or less, adult: Secondary | ICD-10-CM | POA: Diagnosis not present

## 2018-01-22 DIAGNOSIS — K922 Gastrointestinal hemorrhage, unspecified: Secondary | ICD-10-CM | POA: Diagnosis not present

## 2018-01-22 DIAGNOSIS — Z66 Do not resuscitate: Secondary | ICD-10-CM | POA: Diagnosis present

## 2018-01-22 DIAGNOSIS — D500 Iron deficiency anemia secondary to blood loss (chronic): Secondary | ICD-10-CM | POA: Diagnosis present

## 2018-01-22 DIAGNOSIS — F17200 Nicotine dependence, unspecified, uncomplicated: Secondary | ICD-10-CM | POA: Diagnosis present

## 2018-01-22 DIAGNOSIS — F1910 Other psychoactive substance abuse, uncomplicated: Secondary | ICD-10-CM | POA: Diagnosis present

## 2018-01-22 DIAGNOSIS — R9389 Abnormal findings on diagnostic imaging of other specified body structures: Secondary | ICD-10-CM | POA: Diagnosis present

## 2018-01-22 DIAGNOSIS — K625 Hemorrhage of anus and rectum: Secondary | ICD-10-CM

## 2018-01-22 DIAGNOSIS — I952 Hypotension due to drugs: Secondary | ICD-10-CM | POA: Diagnosis not present

## 2018-01-22 DIAGNOSIS — R0600 Dyspnea, unspecified: Secondary | ICD-10-CM

## 2018-01-22 DIAGNOSIS — Z560 Unemployment, unspecified: Secondary | ICD-10-CM

## 2018-01-22 DIAGNOSIS — Z79899 Other long term (current) drug therapy: Secondary | ICD-10-CM

## 2018-01-22 DIAGNOSIS — K7290 Hepatic failure, unspecified without coma: Secondary | ICD-10-CM | POA: Diagnosis present

## 2018-01-22 DIAGNOSIS — K2971 Gastritis, unspecified, with bleeding: Secondary | ICD-10-CM

## 2018-01-22 DIAGNOSIS — Z9289 Personal history of other medical treatment: Secondary | ICD-10-CM

## 2018-01-22 LAB — CBC WITH DIFFERENTIAL/PLATELET
BASOS PCT: 0 %
Basophils Absolute: 0 10*3/uL (ref 0.0–0.1)
Eosinophils Absolute: 0 10*3/uL (ref 0.0–0.7)
Eosinophils Relative: 0 %
HCT: 13.3 % — ABNORMAL LOW (ref 36.0–46.0)
Hemoglobin: 4.7 g/dL — CL (ref 12.0–15.0)
LYMPHS ABS: 1 10*3/uL (ref 0.7–4.0)
Lymphocytes Relative: 10 %
MCH: 31.8 pg (ref 26.0–34.0)
MCHC: 35.3 g/dL (ref 30.0–36.0)
MCV: 89.9 fL (ref 78.0–100.0)
MONO ABS: 1 10*3/uL (ref 0.1–1.0)
Monocytes Relative: 11 %
Neutro Abs: 7.5 10*3/uL (ref 1.7–7.7)
Neutrophils Relative %: 79 %
PLATELETS: 164 10*3/uL (ref 150–400)
RBC: 1.48 MIL/uL — AB (ref 3.87–5.11)
RDW: 21.4 % — AB (ref 11.5–15.5)
WBC: 9.5 10*3/uL (ref 4.0–10.5)

## 2018-01-22 LAB — LIPASE, BLOOD: LIPASE: 31 U/L (ref 11–51)

## 2018-01-22 LAB — MAGNESIUM: MAGNESIUM: 1.5 mg/dL — AB (ref 1.7–2.4)

## 2018-01-22 LAB — IRON AND TIBC
Iron: 104 ug/dL (ref 28–170)
SATURATION RATIOS: 90 % — AB (ref 10.4–31.8)
TIBC: 116 ug/dL — AB (ref 250–450)
UIBC: 12 ug/dL

## 2018-01-22 LAB — PREPARE RBC (CROSSMATCH)

## 2018-01-22 LAB — BILIRUBIN, FRACTIONATED(TOT/DIR/INDIR)
BILIRUBIN DIRECT: 6.8 mg/dL — AB (ref 0.0–0.2)
BILIRUBIN INDIRECT: 5.3 mg/dL — AB (ref 0.3–0.9)
BILIRUBIN TOTAL: 12.1 mg/dL — AB (ref 0.3–1.2)

## 2018-01-22 LAB — COMPREHENSIVE METABOLIC PANEL
ALBUMIN: 1.9 g/dL — AB (ref 3.5–5.0)
ALT: 18 U/L (ref 0–44)
AST: 46 U/L — AB (ref 15–41)
Alkaline Phosphatase: 96 U/L (ref 38–126)
Anion gap: 14 (ref 5–15)
BUN: 27 mg/dL — AB (ref 6–20)
CO2: 20 mmol/L — ABNORMAL LOW (ref 22–32)
Calcium: 7.6 mg/dL — ABNORMAL LOW (ref 8.9–10.3)
Chloride: 93 mmol/L — ABNORMAL LOW (ref 98–111)
Creatinine, Ser: 1.86 mg/dL — ABNORMAL HIGH (ref 0.44–1.00)
GFR calc non Af Amer: 30 mL/min — ABNORMAL LOW (ref >60)
GFR, EST AFRICAN AMERICAN: 35 mL/min — AB (ref >60)
Glucose, Bld: 87 mg/dL (ref 70–99)
POTASSIUM: 2.9 mmol/L — AB (ref 3.5–5.1)
SODIUM: 127 mmol/L — AB (ref 135–145)
TOTAL PROTEIN: 5.4 g/dL — AB (ref 6.5–8.1)
Total Bilirubin: 12.2 mg/dL — ABNORMAL HIGH (ref 0.3–1.2)

## 2018-01-22 LAB — RETICULOCYTES
RBC.: 1.58 MIL/uL — AB (ref 3.87–5.11)
Retic Count, Absolute: 45.8 10*3/uL (ref 19.0–186.0)
Retic Ct Pct: 2.9 % (ref 0.4–3.1)

## 2018-01-22 LAB — FOLATE: FOLATE: 8.7 ng/mL (ref >5.9)

## 2018-01-22 LAB — AMMONIA: Ammonia: 67 umol/L — ABNORMAL HIGH (ref 9–35)

## 2018-01-22 LAB — FERRITIN: Ferritin: 300 ng/mL (ref 11–307)

## 2018-01-22 LAB — BRAIN NATRIURETIC PEPTIDE: B NATRIURETIC PEPTIDE 5: 75.2 pg/mL (ref 0.0–100.0)

## 2018-01-22 LAB — PHOSPHORUS: Phosphorus: 2.6 mg/dL (ref 2.5–4.6)

## 2018-01-22 LAB — ABO/RH: ABO/RH(D): A NEG

## 2018-01-22 LAB — VITAMIN B12: VITAMIN B 12: 1883 pg/mL — AB (ref 180–914)

## 2018-01-22 LAB — TROPONIN I: Troponin I: <0.03 ng/mL (ref <0.03)

## 2018-01-22 MED ORDER — POLYETHYLENE GLYCOL 3350 17 G PO PACK: 17.0000 g | PACK | Freq: Every day | ORAL | Status: DC | PRN

## 2018-01-22 MED ORDER — POTASSIUM CHLORIDE 20 MEQ PO PACK
40.0000 meq | PACK | ORAL | Status: DC
  Administered 2018-01-22 – 2018-01-23 (×2): 40 meq via ORAL
  Filled 2018-01-22 (×2): qty 2

## 2018-01-22 MED ORDER — POLYMYXIN B-TRIMETHOPRIM 10000-0.1 UNIT/ML-% OP SOLN
1.0000 [drp] | Freq: Four times a day (QID) | OPHTHALMIC | Status: DC
  Administered 2018-01-23 – 2018-01-26 (×7): 1 [drp] via OPHTHALMIC
  Filled 2018-01-22 (×2): qty 10

## 2018-01-22 MED ORDER — MAGNESIUM SULFATE 2 GM/50ML IV SOLN
2.0000 g | Freq: Once | INTRAVENOUS | Status: AC
  Administered 2018-01-22: 2 g via INTRAVENOUS
  Filled 2018-01-22: qty 50

## 2018-01-22 MED ORDER — FLUTICASONE FUROATE-VILANTEROL 100-25 MCG/INH IN AEPB
1.0000 | INHALATION_SPRAY | Freq: Every day | RESPIRATORY_TRACT | Status: DC
  Administered 2018-01-22: 1 via RESPIRATORY_TRACT
  Filled 2018-01-22: qty 28

## 2018-01-22 MED ORDER — PREDNISOLONE ACETATE 1 % OP SUSP
1.0000 [drp] | Freq: Every day | OPHTHALMIC | Status: DC
  Administered 2018-01-22 – 2018-01-26 (×4): 1 [drp] via OPHTHALMIC
  Filled 2018-01-22: qty 5

## 2018-01-22 MED ORDER — FLUTICASONE FUROATE-VILANTEROL 100-25 MCG/INH IN AEPB
1.0000 | INHALATION_SPRAY | Freq: Every day | RESPIRATORY_TRACT | Status: DC
  Filled 2018-01-22: qty 28

## 2018-01-22 MED ORDER — SODIUM CHLORIDE 0.9 % IV SOLN
1.0000 g | INTRAVENOUS | Status: DC
  Administered 2018-01-22 – 2018-01-27 (×6): 1 g via INTRAVENOUS
  Filled 2018-01-22 (×6): qty 1

## 2018-01-22 MED ORDER — ONDANSETRON HCL 4 MG/2ML IJ SOLN
4.0000 mg | Freq: Four times a day (QID) | INTRAMUSCULAR | Status: DC | PRN
  Administered 2018-01-23: 4 mg via INTRAVENOUS
  Filled 2018-01-22: qty 2

## 2018-01-22 MED ORDER — THIAMINE HCL 100 MG/ML IJ SOLN
Freq: Once | INTRAVENOUS | Status: AC
  Administered 2018-01-22: 18:00:00 via INTRAVENOUS
  Filled 2018-01-22: qty 1000

## 2018-01-22 MED ORDER — LORAZEPAM 2 MG/ML IJ SOLN: 1.0000 mg | INTRAMUSCULAR | Status: DC | PRN

## 2018-01-22 MED ORDER — SODIUM CHLORIDE 0.9 % IV BOLUS
500.0000 mL | Freq: Once | INTRAVENOUS | Status: AC
  Administered 2018-01-22: 500 mL via INTRAVENOUS

## 2018-01-22 MED ORDER — FAMOTIDINE IN NACL 20-0.9 MG/50ML-% IV SOLN
20.0000 mg | Freq: Two times a day (BID) | INTRAVENOUS | Status: DC
  Filled 2018-01-22: qty 50

## 2018-01-22 MED ORDER — TRAZODONE HCL 50 MG PO TABS: 25.0000 mg | ORAL_TABLET | Freq: Every evening | ORAL | Status: DC | PRN

## 2018-01-22 MED ORDER — ONDANSETRON HCL 4 MG PO TABS: 4.0000 mg | ORAL_TABLET | Freq: Four times a day (QID) | ORAL | Status: DC | PRN

## 2018-01-22 MED ORDER — SODIUM CHLORIDE 0.9 % IV BOLUS
1000.0000 mL | Freq: Once | INTRAVENOUS | Status: AC
  Administered 2018-01-22: 1000 mL via INTRAVENOUS

## 2018-01-22 MED ORDER — SODIUM CHLORIDE 0.9 % IV SOLN: 10.0000 mL/h | Freq: Once | INTRAVENOUS | Status: DC

## 2018-01-22 MED ORDER — RIFAXIMIN 550 MG PO TABS
550.0000 mg | ORAL_TABLET | Freq: Two times a day (BID) | ORAL | Status: DC
  Administered 2018-01-22 – 2018-01-26 (×8): 550 mg via ORAL
  Filled 2018-01-22 (×10): qty 1

## 2018-01-22 MED ORDER — NICOTINE 14 MG/24HR TD PT24
14.0000 mg | MEDICATED_PATCH | Freq: Every day | TRANSDERMAL | Status: DC
  Administered 2018-01-23: 14 mg via TRANSDERMAL
  Filled 2018-01-22: qty 1

## 2018-01-22 NOTE — ED Provider Notes (Signed)
[place] DEPT Provider Note   CSN: [id number] Arrival date & time: 01/22/18  1327     History   Chief Complaint Chief Complaint  Patient presents with  . Weakness    HPI Debbie Grimes is a 52 y.o. female.  HPI With multiple medical issues presents for evaluation of weakness. Patient appears ill, but is awake, alert, offers that she has felt progressively weak over the past few days, likely longer. She notes that she drinks approximately half a pint of alcohol daily. She denies focal pain, states that she hurts all over. She denies acute to skin color changes She denies overt confusion or disorientation. No clear precipitant for her progression of weakness, and since onset and she continues to feel worse, without clear exacerbating or alleviating factors.  Eventually the patient's family arrives, they note the patient has been progressively weaker.  Past Medical History:  Diagnosis Date  . Alcoholism ([place]) 2000   chronic  . Bipolar 1 disorder ([place]) 2010   with depression and anxiety  . COPD (chronic obstructive pulmonary disease) ([place])   . Falls frequently   . Hepatic steatosis 08/1698   alcoholic fatty liver.  Metavir fibrosis score F 2 with some F3  . Hepatitis C   . Hepatitis C antibody test positive 09/2015   HCV quant: no virus detected.   . Polysubstance abuse ([place]) 2000  . Protein-calorie malnutrition ([place]) 2012  . Symptomatic anemia 10/09/2017    Patient Active Problem List   Diagnosis Date Noted  . Heme positive stool   . Benign neoplasm of ascending colon   . Benign neoplasm of cecum   . Benign neoplasm of transverse colon   . Anemia due to blood loss 10/10/2017  . Malnutrition of moderate degree 10/10/2017  . Symptomatic anemia 10/09/2017  . Weakness   . Chronic obstructive pulmonary disease ([place])   . 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 Hepatic steatosis 09/30/2015    . Anemia, iron deficiency   . Chronic hepatitis C without hepatic coma ([place])   . Alcoholic liver disease ([place])   . Protein-calorie malnutrition, severe 09/29/2015  . Alcohol withdrawal ([place]) 09/28/2015  . Normocytic anemia 07/12/2015  . Leg wound, left 07/10/2015  . ETOH abuse 07/10/2015  . Heavy cigarette smoker (20-39 per day) 07/10/2015    Past Surgical History:  Procedure Laterality Date  . BIOPSY  10/11/2017   Procedure: BIOPSY;  Surgeon: Ladene Artist, MD;  Location: [place];  Service: Endoscopy;;  . COLONOSCOPY WITH PROPOFOL N/A 10/11/2017   Procedure: COLONOSCOPY WITH PROPOFOL;  Surgeon: Ladene Artist, MD;  Location[facility] ENDOSCOPY;  Service: Endoscopy;  Laterality: N/A;  . ESOPHAGOGASTRODUODENOSCOPY (EGD) WITH PROPOFOL N/A 10/11/2017   Procedure: ESOPHAGOGASTRODUODENOSCOPY (EGD) WITH PROPOFOL;  Surgeon: Ladene Artist, MD;  Location: [facility] ENDOSCOPY;  Service: Endoscopy;  Laterality: N/A;  . hallux laceration, repair distal phalanx fracture Left 01/2011   lawn mower injury.  . IRRIGATION AND DEBRIDEMENT SEBACEOUS CYST  06/2003   nasal   . POLYPECTOMY  10/11/2017   Procedure: POLYPECTOMY;  Surgeon: Ladene Artist, MD;  Location: [facility] ENDOSCOPY;  Service: Endoscopy;;  . TUBAL LIGATION       OB History    Gravida  2   Para  2   Term  0   Preterm  0   AB  0   Living        SAB  0  TAB  0   Ectopic  0   Multiple      Live Births               Home Medications    Prior to Admission medications   Medication Sig Start Date End Date Taking? Authorizing Provider  ibuprofen (ADVIL,MOTRIN) 200 MG tablet Take 200 mg by mouth daily as needed (pain).   Yes [provider]  pantoprazole (PROTONIX) 40 MG tablet Take 1 tablet (40 mg total) by mouth 2 (two) times daily. 12/22/17  Yes Levin Erp, PA  Potassium Chloride ER 20 MEQ TBCR Take 20 mEq by mouth daily for 2 days. 12/26/17 01/22/18 Yes Lemmon, Lavone Nian, PA  prednisoLONE  acetate (PRED FORTE) 1 % ophthalmic suspension Place 1 drop into the left eye daily. 01/04/18  Yes [provider]  spironolactone (ALDACTONE) 25 MG tablet Take 1 tablet (25 mg total) by mouth daily. 9/98/33  Yes Delora Fuel, MD  Tiotropium Bromide-Olodaterol (STIOLTO RESPIMAT) 2.5-2.5 MCG/ACT AERS Inhale 1 Inhaler into the lungs daily as needed (SOB).    Yes [provider]  trimethoprim-polymyxin b (POLYTRIM) ophthalmic solution Place 1 drop into both eyes every 6 (six) hours. 01/05/18  Yes [provider]  AMBULATORY NON FORMULARY MEDICATION Ensure - drink Ensure three times daily. Patient not taking: Reported on 01/22/2018 12/22/17   Levin Erp, PA  folic acid (FOLVITE) 1 MG tablet TAKE 1 TABLET BY MOUTH EVERY DAY Patient not taking: Reported on 01/22/2018 10/16/17   Guadalupe Dawn, MD    Family History History reviewed. No pertinent family history.  Social History Social History   Tobacco Use  . Smoking status: Current Every Day Smoker    Packs/day: 1.00    Years: 39.00    Pack years: 39.00    Types: Cigarettes  . Smokeless tobacco: Never Used  . Tobacco comment: 2-3 packs/day since age 72  Substance Use Topics  . Alcohol use: Not Currently    Alcohol/week: 14.0 standard drinks    Types: 14 Cans of beer per week    Comment: 09/08/2017 "22oz beer qd"  . Drug use: Never     Allergies   Patient has no known allergies.   Review of Systems Review of Systems  Constitutional:       Per HPI, otherwise negative  HENT:       Per HPI, otherwise negative  Respiratory:       Per HPI, otherwise negative  Cardiovascular:       Per HPI, otherwise negative  Gastrointestinal: Negative for vomiting.  Endocrine:       Negative aside from HPI  Genitourinary:       Neg aside from HPI   Musculoskeletal:       Per HPI, otherwise negative  Skin: Positive for color change.  Neurological: Positive for weakness. Negative for syncope.    Psychiatric/Behavioral: Positive for dysphoric mood. Negative for suicidal ideas.     Physical Exam Updated Vital Signs BP (!) 152/68 (BP Location: Right Arm)   Pulse 62   Temp 98 F (36.7 C) (Oral)   Resp 12   SpO2 99%   Physical Exam  Constitutional: She is oriented to person, place, and time. She appears ill.  Ill-appearing cachectic female who appears substantially older than stated age  HENT:  Head: Normocephalic and atraumatic.  Eyes: Conjunctivae and EOM are normal.  Cardiovascular: Normal rate and regular rhythm.  Pulmonary/Chest: Effort normal and breath sounds normal. No stridor. No  respiratory distress.  Abdominal: She exhibits no distension. There is no tenderness. There is no guarding.  Genitourinary: Rectal exam shows guaiac positive stool.  Musculoskeletal: She exhibits no edema.  Neurological: She is alert and oriented to person, place, and time. She displays atrophy. She displays no tremor. No cranial nerve deficit. She exhibits normal muscle tone. She displays no seizure activity. Coordination normal.  Skin: Skin is warm and dry.  Psychiatric: Her speech is delayed. She is slowed and withdrawn.  Nursing note and vitals reviewed.    ED Treatments / Results  Labs (all labs ordered are listed, but only abnormal results are displayed) Labs Reviewed  COMPREHENSIVE METABOLIC PANEL - Abnormal; Notable for the following components:      Result Value   Sodium 127 (*)    Potassium 2.9 (*)    Chloride 93 (*)    CO2 20 (*)    BUN 27 (*)    Creatinine, Ser 1.86 (*)    Calcium 7.6 (*)    Total Protein 5.4 (*)    Albumin 1.9 (*)    AST 46 (*)    Total Bilirubin 12.2 (*)    GFR calc non Af Amer 30 (*)    GFR calc Af Amer 35 (*)    All other components within normal limits  CBC WITH DIFFERENTIAL/PLATELET - Abnormal; Notable for the following components:   RBC 1.48 (*)    Hemoglobin 4.7 (*)    HCT 13.3 (*)    RDW 21.4 (*)    All other components within normal  limits  LIPASE, BLOOD  TROPONIN I  BRAIN NATRIURETIC PEPTIDE  URINALYSIS, ROUTINE W REFLEX MICROSCOPIC  RAPID URINE DRUG SCREEN, HOSP PERFORMED  TYPE AND SCREEN  PREPARE RBC (CROSSMATCH)    EKG EKG Interpretation  Date/Time:  Monday January 22 2018 14:12:17 EDT Ventricular Rate:  99 PR Interval:    QRS Duration: 80 QT Interval:  344 QTC Calculation: 442 R Axis:   65 Text Interpretation:  Sinus rhythm Low voltage, precordial leads Borderline repolarization abnormality Artifact Abnormal ekg Confirmed by Carmin Muskrat [phone]) on 01/22/2018 2:18:09 PM Also confirmed by Carmin Muskrat ([id number]), editor Philomena Doheny [phone])  on 01/22/2018 3:43:54 PM   Radiology No results found.  Procedures Procedures (including critical care time)  Medications Ordered in ED Medications  0.9 %  sodium chloride infusion (has no administration in time range)  sodium chloride 0.9 % bolus 1,000 mL (1,000 mLs Intravenous New Bag/Given 01/22/18 1423)     Initial Impression / Assessment and Plan / ED Course  I have reviewed the triage vital signs and the nursing notes.  Pertinent labs & imaging results that were available during my care of the patient were reviewed by me and considered in my medical decision making (see chart for details).    Initial labs notable for critical abnormal hemoglobin value, 4.7 She is Hemoccult positive, has a type and screen, is preparing for transfusion.  4:06 PM Patient's remaining labs notable for acute kidney injury.  Nowpreparing for blood transfusion, is receiving IV fluids.  This cachectic female continues to alcohol daily presents with weakness. Patient is ill in appearance, but awake and alert. Patient found to have critical abnormal hemoglobin value, Hemoccult positive stool, as well as acute kidney injury.  She also found to have hyperbilirubinemia with a value of greater than 12. However, the patient is not initially hypotensive. Patient had initial  fluid resuscitation, was preparing for blood transfusion. I discussed her case with our  gastroenterology colleagues who will follows as a Doctor, general practice.  Final Clinical Impressions(s) / ED Diagnoses  Symptomatic anemia Acute kidney injury Hyperbilirubinemia  CRITICAL CARE Performed by: Carmin Muskrat Total critical care time: 40 minutes Critical care time was exclusive of separately billable procedures and treating other patients. Critical care was necessary to treat or prevent imminent or life-threatening deterioration. Critical care was time spent personally by me on the following activities: development of treatment plan with patient and/or surrogate as well as nursing, discussions with consultants, evaluation of patient's response to treatment, examination of patient, obtaining history from patient or surrogate, ordering and performing treatments and interventions, ordering and review of laboratory studies, ordering and review of radiographic studies, pulse oximetry and re-evaluation of patient's condition.   Carmin Muskrat, MD 01/22/18 [phone]

## 2018-01-22 NOTE — ED Notes (Signed)
ULTRASOUND AT BEDSIDE

## 2018-01-22 NOTE — ED Notes (Signed)
Date and time results received: 01/22/18 15:30 (use smartphrase ".now" to insert current time)  Test: HGB Critical Value: 4.7   Name of Provider Notified: Dr. Vanita Panda   Orders Received? Or Actions Taken?: Will continue to monitor and await for new orders.

## 2018-01-22 NOTE — ED Notes (Signed)
Bed: [facility] Expected date:  Expected time:  Means of arrival:  Comments: EMS ETOH 

## 2018-01-22 NOTE — ED Triage Notes (Addendum)
Patient BIB EMS from home. Patient has history of alcoholism daily. Patient's boyfriend called EMS for decreased PO intake and generalized weakness. Patient hypotensive and lethargic in triage. Patient denies complaints. Diffuse jaundice noted. EMS VS: BP 96 palpate, HR102, RR16, O2 97%, CBG=112

## 2018-01-22 NOTE — Consult Note (Addendum)
Referring Provider: [facility]  Primary Care Physician:  Patient, No Pcp Per Primary Gastroenterologist:  Lucio Edward, MD  Reason for Consultation: recurrent anemia    ASSESSMENT AND PLAN:     1.  Decompensated cirrhosis severe anemia. MELD 24. DF < 44.  52 year old female with longstanding history of alcohol abuse (ongoing) and recent finding of possible cirrhosis / ascites in July.  Patient admitted today with weakness, severe anemia, new jaundice T. Bili up from < 2 to 12.2. Oriented now but per sister patient was confused earlier.  -Start Rocephin for SBP prophylaxis -Watch for ETOH withdrawal.  -hold diuretics, in AKI -dx paracentesis with cell count, gram stain, albumin. Will calculate SAAG.  -Start Xifaxan. Not confused at present, no asterixis but confusion at home which could be multifactorial. No lactulose for now given that she is already having loose stools and K+ is low.   -HCC: No focal liver lesions on CT scan mid July, will check an AFP -Varices screening: No varices seen on EGD in June 2019 Surveillance EGD at 2 years -She will need hepatitis B vaccines at some point.  Her hep A total antibody is positive -Severe hypoalbuminemia, for most part this is likely from malnourishment.   2. Recurrent normocytic anemia. Having "dark" brown stool for a couple of months. Not on iron, Heme +. Takes daily NSAIDS.  -She will need EGD when medically stable. -If EGD negative, may need repeat colonoscopy given hx of large angiodysplastic lesions on recent colonoscopy.  -blood transfusion already ordered -continue H2 blocker   3.  Barrett's esophagus, due for surveillance EGD June 2021  4. History of colonic tubular adenomas, due for surveillance June 2022    HPI: Debbie Grimes is a 52 y.o. female with hx of ETOH abuse, HCV ab+ / negative quant, recurrent anemia, bipolar disorder. We saw her in the hospital 2017 for evaluation of anemia.  Patient declined  endoscopic work-up.  We saw her again in consultation this past June, again for acute on chronic anemia.  Inpatient endoscopic work-up - EGD remarkable for long segment Barrett's esophagus, small hiatal hernia, normal duodenum.. She had a complete colonoscopy with polypectomy including a 14 mm polyp at the ileocecal valve.  Polyp pathology compatible tubular adenoma without HGD.  Other findings included 2 nonbleeding angiodysplastic lesions in the cecum.  Esophageal biopsy was compatible with Barrett's without dysplasia.   Patient seen in ED mid July for abdominal pain and edema. CT scan of the abdomen and pelvis showed ascites, possible cirrhosis.  Started on low-dose diuretics.  Patient was seen in follow-up in our office 12/22/2017, abdominal distention had improved on Aldactone.  We ordered a complete serologic work-up for chronic liver disease (negative) as well as an ultrasound-guided paracentesis with fluid studies including cytology (negative for malignancy)  Brought to ED today with weakness, poor PO intake.  ED evaluation: Hypotensive, HR 102.  Sodium 127 Potassium 2.9 AKI with creatinine 1.86 Albumin 1.9    Past Medical History:  Diagnosis Date  . Alcoholism ([place]) 2000   chronic  . Bipolar 1 disorder ([place]) 2010   with depression and anxiety  . COPD (chronic obstructive pulmonary disease) ([place])   . Falls frequently   . Hepatic steatosis 0/9326   alcoholic fatty liver.  Metavir fibrosis score F 2 with some F3  . Hepatitis C   . Hepatitis C antibody test positive 09/2015   HCV quant: no virus detected.   . Polysubstance abuse ([place]) 2000  .  Protein-calorie malnutrition ([place]) 2012  . Symptomatic anemia 10/09/2017    Past Surgical History:  Procedure Laterality Date  . BIOPSY  10/11/2017   Procedure: BIOPSY;  Surgeon: Ladene Artist, MD;  Location: [place];  Service: Endoscopy;;  . COLONOSCOPY WITH PROPOFOL N/A 10/11/2017   Procedure: COLONOSCOPY WITH PROPOFOL;  Surgeon:  Ladene Artist, MD;  Location[facility] ENDOSCOPY;  Service: Endoscopy;  Laterality: N/A;  . ESOPHAGOGASTRODUODENOSCOPY (EGD) WITH PROPOFOL N/A 10/11/2017   Procedure: ESOPHAGOGASTRODUODENOSCOPY (EGD) WITH PROPOFOL;  Surgeon: Ladene Artist, MD;  Location: [facility] ENDOSCOPY;  Service: Endoscopy;  Laterality: N/A;  . hallux laceration, repair distal phalanx fracture Left 01/2011   lawn mower injury.  . IRRIGATION AND DEBRIDEMENT SEBACEOUS CYST  06/2003   nasal   . POLYPECTOMY  10/11/2017   Procedure: POLYPECTOMY;  Surgeon: Ladene Artist, MD;  Location: [place];  Service: Endoscopy;;  . TUBAL LIGATION      Prior to Admission medications   Medication Sig Start Date End Date Taking? Authorizing Provider  ibuprofen (ADVIL,MOTRIN) 200 MG tablet Take 200 mg by mouth daily as needed (pain).   Yes [provider]  pantoprazole (PROTONIX) 40 MG tablet Take 1 tablet (40 mg total) by mouth 2 (two) times daily. 12/22/17  Yes Levin Erp, PA  Potassium Chloride ER 20 MEQ TBCR Take 20 mEq by mouth daily for 2 days. 12/26/17 01/22/18 Yes Lemmon, Lavone Nian, PA  prednisoLONE acetate (PRED FORTE) 1 % ophthalmic suspension Place 1 drop into the left eye daily. 01/04/18  Yes [provider]  spironolactone (ALDACTONE) 25 MG tablet Take 1 tablet (25 mg total) by mouth daily. 04/25/73  Yes Delora Fuel, MD  Tiotropium Bromide-Olodaterol (STIOLTO RESPIMAT) 2.5-2.5 MCG/ACT AERS Inhale 1 Inhaler into the lungs daily as needed (SOB).    Yes [provider]  trimethoprim-polymyxin b (POLYTRIM) ophthalmic solution Place 1 drop into both eyes every 6 (six) hours. 01/05/18  Yes [provider]  AMBULATORY NON FORMULARY MEDICATION Ensure - drink Ensure three times daily. Patient not taking: Reported on 01/22/2018 12/22/17   Levin Erp, PA  folic acid (FOLVITE) 1 MG tablet TAKE 1 TABLET BY MOUTH EVERY DAY Patient not taking: Reported on 01/22/2018 10/16/17   Guadalupe Dawn, MD    Current Facility-Administered Medications  Medication Dose Route Frequency Provider Last Rate Last Dose  . 0.9 %  sodium chloride infusion  10 mL/hr Intravenous Once Carmin Muskrat, MD      . nicotine (NICODERM CQ - dosed in mg/24 hours) patch 14 mg  14 mg Transdermal Daily Gonfa, Taye T, MD      . potassium chloride (KLOR-CON) packet 40 mEq  40 mEq Oral Q4H Mercy Riding, MD       Current Outpatient Medications  Medication Sig Dispense Refill  . ibuprofen (ADVIL,MOTRIN) 200 MG tablet Take 200 mg by mouth daily as needed (pain).    . pantoprazole (PROTONIX) 40 MG tablet Take 1 tablet (40 mg total) by mouth 2 (two) times daily. 60 tablet 1  . Potassium Chloride ER 20 MEQ TBCR Take 20 mEq by mouth daily for 2 days. 2 tablet 0  . prednisoLONE acetate (PRED FORTE) 1 % ophthalmic suspension Place 1 drop into the left eye daily.  0  . spironolactone (ALDACTONE) 25 MG tablet Take 1 tablet (25 mg total) by mouth daily. 90 tablet 0  . Tiotropium Bromide-Olodaterol (STIOLTO RESPIMAT) 2.5-2.5 MCG/ACT AERS Inhale 1 Inhaler into the lungs daily as needed (SOB).     Marland Kitchen  trimethoprim-polymyxin b (POLYTRIM) ophthalmic solution Place 1 drop into both eyes every 6 (six) hours.  0  . AMBULATORY NON FORMULARY MEDICATION Ensure - drink Ensure three times daily. (Patient not taking: Reported on 10/23/1599) 90 Can 3  . folic acid (FOLVITE) 1 MG tablet TAKE 1 TABLET BY MOUTH EVERY DAY (Patient not taking: Reported on 01/22/2018) 30 tablet 0    Allergies as of 01/22/2018  . (No Known Allergies)    History reviewed. No pertinent family history.  Social History   Socioeconomic History  . Marital status: Single    Spouse name: Not on file  . Number of children: Not on file  . Years of education: Not on file  . Highest education level: Not on file  Occupational History  . Not on file  Social Needs  . Financial resource strain: Not on file  . Food insecurity:    Worry: Not on file     Inability: Not on file  . Transportation needs:    Medical: Not on file    Non-medical: Not on file  Tobacco Use  . Smoking status: Current Every Day Smoker    Packs/day: 1.00    Years: 39.00    Pack years: 39.00    Types: Cigarettes  . Smokeless tobacco: Never Used  . Tobacco comment: 2-3 packs/day since age 53  Substance and Sexual Activity  . Alcohol use: Not Currently    Alcohol/week: 14.0 standard drinks    Types: 14 Cans of beer per week    Comment: 09/08/2017 "22oz beer qd"  . Drug use: Never  . Sexual activity: Not Currently    Birth control/protection: None  Lifestyle  . Physical activity:    Days per week: Not on file    Minutes per session: Not on file  . Stress: Not on file  Relationships  . Social connections:    Talks on phone: Not on file    Gets together: Not on file    Attends religious service: Not on file    Active member of club or organization: Not on file    Attends meetings of clubs or organizations: Not on file    Relationship status: Not on file  . Intimate partner violence:    Fear of current or ex partner: Not on file    Emotionally abused: Not on file    Physically abused: Not on file    Forced sexual activity: Not on file  Other Topics Concern  . Not on file  Social History Narrative   Used to work in [employer] though unemployed since age 30 due to feet pain   Spends most of her days on a recliner watching TV and drinking beverages   Lives with a female partner        Review of Systems: All systems reviewed and negative except where noted in HPI.  Physical Exam: Vital signs in last 24 hours: Temp:  [98 F (36.7 C)] 98 F (36.7 C) (09/30 1535) Pulse Rate:  [62-107] 104 (09/30 1615) Resp:  [12-33] 25 (09/30 1645) BP: (83-152)/(52-68) 89/52 (09/30 1645) SpO2:  [92 %-99 %] 94 % (09/30 1615)   General:   Alert, emaciated white female who appears older than stated age.  Psych:  cooperative. Normal mood and affect. Eyes:  Pupils  equal, sclera clear,+ icterus Ears:  Normal auditory acuity. Nose:  No deformity, discharge,  or lesions. Neck:  Supple; no masses Lungs:  Clear throughout to auscultation.   No wheezes, crackles, or  rhonchi.  Heart:  Regular rate and rhythm; no murmurs, no edema Abdomen:  Soft, moderately distended, mild diffuse tenderness, BS active, no palp mass    Rectal:  Deferred. Dark brown stool in diaper Msk:  Symmetrical without gross deformities. . Neurologic:  Alert and  oriented x4;  grossly normal neurologically.No asterixis Skin:  Intact without significant lesions or rashes..   Intake/Output from previous day: No intake/output data recorded. Intake/Output this shift: Total I/O In: 1004 [IV Piggyback:1004] Out: -   Lab Results: Recent Labs    01/22/18 1403  WBC 9.5  HGB 4.7*  HCT 13.3*  PLT 164   BMET Recent Labs    01/22/18 1403  NA 127*  K 2.9*  CL 93*  CO2 20*  GLUCOSE 87  BUN 27*  CREATININE 1.86*  CALCIUM 7.6*   LFT Recent Labs    01/22/18 1403  PROT 5.4*  ALBUMIN 1.9*  AST 46*  ALT 18  ALKPHOS 96  BILITOT 12.2*     Studies/Results: No results found.   Tye Savoy, NP-C @  01/22/2018, 5:03 PM    Attending physician's note   I have taken a history, examined the patient and reviewed the chart. I agree with the Advanced Practitioner's note, impression and recommendations. (Acute alcoholic hepatitis) Acute decompensation of underlying chronic liver disease with alcoholic cirrhosis Meld sodium score 29.  Discriminant function less than 32.  No indication for steroids. Overall prognosis poor Diagnostic paracentesis to exclude SBP Severe anemia, hemoglobin 4.  Transfuse to hemoglobin 7 We will plan for EGD once patient is adequately resuscitated Currently has no overt sign of GI bleeding, stool heme positive dark brown on rectal exam. Monitor for alcohol withdrawal Continue ceftriaxone for SBP prophylaxis, has ascites and GI bleed Rifaximin for  hepatic encephalopathy  Damaris Hippo , MD [phone]

## 2018-01-22 NOTE — H&P (Signed)
History and Physical    Debbie Grimes YPP:[id number] DOB: [birth year] DOA: 01/22/2018  PCP: Patient, No Pcp Per Patient coming from: home  Chief Complaint: Weakness  HPI: Debbie Grimes is a 52 y.o. female with medical history significant for alcohol use disorder, BPD-1, COPD, cirrhosis/ascites, polysubstance use, protein calorie malnutrition, anemia and tobacco use who presented to ED with generalized weakness. History provided by patient, patient's sister and boyfriend.  They report that she has been intermittently weak for over a year but worse over the last 1 week.  She also had poor p.o. intake over the last 1 week.  He had a skin has been yellowish as well.  She fell about 2 days ago.  Denies hitting her head or loss of consciousness at that time.  Reports significant weight loss.  They also report hematochezia for the last 2 to 3 months with bowel movement.  She has history of type I bipolar disorder.  They says she has not been able to get her medication due to cost. She denies other symptoms but responded yes to individual review of system. Reports smoking about 10 cigarettes a day.  Reports smoking marijuana.  Reports drinking alcohol.  She had about half a bottle of vodka last night. In ED, hypotensive but resuscitated with good response.  Also tachycardic to low 100s.  Labs remarkable for sodium to 127, potassium 2.9, creatinine 1.86, total bili 12.2, hemoglobin 4.7.  BNP and troponin within normal limits.  EKG without acute ischemic finding.  GI was consulted in ED.  I was called to admit patient for symptomatic anemia and electrolyte derangement.   ROS Review of Systems  Constitutional: Positive for malaise/fatigue and weight loss. Negative for chills and fever.  HENT: Negative for congestion and sore throat.   Eyes: Negative for blurred vision, photophobia and pain.  Respiratory: Positive for cough, sputum production and shortness of breath. Negative for hemoptysis and  wheezing.   Cardiovascular: Positive for chest pain. Negative for palpitations, orthopnea, leg swelling and PND.  Gastrointestinal: Positive for blood in stool. Negative for abdominal pain, diarrhea, melena and vomiting.  Genitourinary: Negative for dysuria and hematuria.  Musculoskeletal: Negative for myalgias.  Skin: Negative for rash.  Neurological: Negative for speech change, focal weakness, weakness and headaches.  Endo/Heme/Allergies: Does not bruise/bleed easily.  Psychiatric/Behavioral: Positive for depression. Negative for substance abuse and suicidal ideas. The patient is not nervous/anxious.     PMH Past Medical History:  Diagnosis Date  . Alcoholism ([place]) 2000   chronic  . Bipolar 1 disorder ([place]) 2010   with depression and anxiety  . COPD (chronic obstructive pulmonary disease) ([place])   . Falls frequently   . Hepatic steatosis 07/5807   alcoholic fatty liver.  Metavir fibrosis score F 2 with some F3  . Hepatitis C   . Hepatitis C antibody test positive 09/2015   HCV quant: no virus detected.   . Polysubstance abuse ([place]) 2000  . Protein-calorie malnutrition ([place]) 2012  . Symptomatic anemia 10/09/2017   PSH Past Surgical History:  Procedure Laterality Date  . BIOPSY  10/11/2017   Procedure: BIOPSY;  Surgeon: Ladene Artist, MD;  Location: [facility];  Service: Endoscopy;;  . COLONOSCOPY WITH PROPOFOL N/A 10/11/2017   Procedure: COLONOSCOPY WITH PROPOFOL;  Surgeon: Ladene Artist, MD;  Location: [facility] ENDOSCOPY;  Service: Endoscopy;  Laterality: N/A;  . ESOPHAGOGASTRODUODENOSCOPY (EGD) WITH PROPOFOL N/A 10/11/2017   Procedure: ESOPHAGOGASTRODUODENOSCOPY (EGD) WITH PROPOFOL;  Surgeon: Ladene Artist, MD;  Location:  [facility] ENDOSCOPY;  Service: Endoscopy;  Laterality: N/A;  . hallux laceration, repair distal phalanx fracture Left 01/2011   lawn mower injury.  . IRRIGATION AND DEBRIDEMENT SEBACEOUS CYST  06/2003   nasal   . POLYPECTOMY  10/11/2017   Procedure: POLYPECTOMY;   Surgeon: Ladene Artist, MD;  Location[facility] ENDOSCOPY;  Service: Endoscopy;;  . TUBAL LIGATION     Fam HX History reviewed. No pertinent family history.  Social Hx  reports that she has been smoking cigarettes. She has a 39.00 pack-year smoking history. She has never used smokeless tobacco. She reports that she drank about 14.0 standard drinks of alcohol per week. She reports that she does not use drugs.  Allergy No Known Allergies Home Meds Prior to Admission medications   Medication Sig Start Date End Date Taking? Authorizing Provider  ibuprofen (ADVIL,MOTRIN) 200 MG tablet Take 200 mg by mouth daily as needed (pain).   Yes [provider]  pantoprazole (PROTONIX) 40 MG tablet Take 1 tablet (40 mg total) by mouth 2 (two) times daily. 12/22/17  Yes Levin Erp, PA  Potassium Chloride ER 20 MEQ TBCR Take 20 mEq by mouth daily for 2 days. 12/26/17 01/22/18 Yes Lemmon, Lavone Nian, PA  prednisoLONE acetate (PRED FORTE) 1 % ophthalmic suspension Place 1 drop into the left eye daily. 01/04/18  Yes [provider]  spironolactone (ALDACTONE) 25 MG tablet Take 1 tablet (25 mg total) by mouth daily. 01/16/25  Yes Delora Fuel, MD  Tiotropium Bromide-Olodaterol (STIOLTO RESPIMAT) 2.5-2.5 MCG/ACT AERS Inhale 1 Inhaler into the lungs daily as needed (SOB).    Yes [provider]  trimethoprim-polymyxin b (POLYTRIM) ophthalmic solution Place 1 drop into both eyes every 6 (six) hours. 01/05/18  Yes [provider]  AMBULATORY NON FORMULARY MEDICATION Ensure - drink Ensure three times daily. Patient not taking: Reported on 01/22/2018 12/22/17   Levin Erp, PA  folic acid (FOLVITE) 1 MG tablet TAKE 1 TABLET BY MOUTH EVERY DAY Patient not taking: Reported on 01/22/2018 10/16/17   Guadalupe Dawn, MD    Physical Exam: Vitals:   01/22/18 1530 01/22/18 1535 01/22/18 1615 01/22/18 1645  BP: 92/62 (!) 152/68 (!) 84/55 (!) 89/52  Pulse: (!) 107 62 (!)  104   Resp: (!) 33 12 16 (!) 25  Temp:  98 F (36.7 C)    TempSrc:  Oral    SpO2: 96% 99% 94%     Constitutional: Cachectic, jaundiced all over Eyes: Icteric sclera and conjunctiva HENMT: atraumatic. Normocephalic. Hearing grossly intact. No rhinorrhea.  No dentition Neck: normal. Supple. No mass.  Resp: normal effort.  Poor air movement bilaterally.  Some rhonchi no crackles or wheeze. CVS: Tachycardic.  RR. S1 & S2 heard. No murmurs. No edema.  GI: normal bowel sound. No tenderness.  Really distended.  Dull to percussion laterally. Possible ascites.  MSK: Significant global muscle mass wasting Skin: no apparent lesion. Normal warmth. Neuro: Awake. Alert. Oriented to self, place, person, month but not date and year. CN 2-12 grossly intact. Light sensation intact. Normal strength. DTR symmetric.  Psych: Flat affect.  Denies SI/HI.  Labs on Admission: I have personally reviewed following labs and imaging studies  CBC: Recent Labs  Lab 01/22/18 1403  WBC 9.5  NEUTROABS 7.5  HGB 4.7*  HCT 13.3*  MCV 89.9  PLT 834   Basic Metabolic Panel: Recent Labs  Lab 01/22/18 1403  NA 127*  K 2.9*  CL 93*  CO2 20*  GLUCOSE 87  BUN 27*  CREATININE 1.86*  CALCIUM 7.6*   GFR: CrCl cannot be calculated (Unknown ideal weight.). Liver Function Tests: Recent Labs  Lab 01/22/18 1403  AST 46*  ALT 18  ALKPHOS 96  BILITOT 12.2*  PROT 5.4*  ALBUMIN 1.9*   Recent Labs  Lab 01/22/18 1403  LIPASE 31   No results for input(s): AMMONIA in the last 168 hours. Coagulation Profile: No results for input(s): INR, PROTIME in the last 168 hours. Cardiac Enzymes: Recent Labs  Lab 01/22/18 1403  TROPONINI <0.03   BNP (last 3 results) No results for input(s): PROBNP in the last 8760 hours. HbA1C: No results for input(s): HGBA1C in the last 72 hours. CBG: No results for input(s): GLUCAP in the last 168 hours. Lipid Profile: No results for input(s): CHOL, HDL, LDLCALC, TRIG,  CHOLHDL, LDLDIRECT in the last 72 hours. Thyroid Function Tests: No results for input(s): TSH, T4TOTAL, FREET4, T3FREE, THYROIDAB in the last 72 hours. Anemia Panel: No results for input(s): VITAMINB12, FOLATE, FERRITIN, TIBC, IRON, RETICCTPCT in the last 72 hours. Urine analysis:    Component Value Date/Time   COLORURINE AMBER (A) 11/09/2017 0050   APPEARANCEUR CLOUDY (A) 11/09/2017 0050   LABSPEC 1.019 11/09/2017 0050   PHURINE 5.0 11/09/2017 0050   GLUCOSEU NEGATIVE 11/09/2017 0050   HGBUR NEGATIVE 11/09/2017 0050   BILIRUBINUR NEGATIVE 11/09/2017 0050   KETONESUR NEGATIVE 11/09/2017 0050   PROTEINUR 30 (A) 11/09/2017 0050   UROBILINOGEN 0.2 01/27/2014 2000   NITRITE POSITIVE (A) 11/09/2017 0050   LEUKOCYTESUR TRACE (A) 11/09/2017 0050    Sepsis Labs: none @LABRCNTIP (procalcitonin:4,lacticidven:4) )No results found for this or any previous visit (from the past 240 hour(s)).   Radiological Exams on Admission: No results found.  All images have been reviewed by me personally.   EKG: Mild sinus tachycardia without acute ischemic finding.  Assessment/Plan Active Problems:   ETOH abuse   Tobacco use disorder   Protein-calorie malnutrition, severe   Symptomatic anemia   GI bleed   Hyponatremia   Hypokalemia   Hyperbilirubinemia   AKI (acute kidney injury) ([place])   Metabolic acidosis with increased anion gap and accumulation of organic acids   Symptomatic normocytic anemia: Multifactorial.  History of GI bleed.  History of alcohol abuse.  Poor p.o. intake as well.  Hemoglobin 4.7 on admission.  Baseline 7-8. -Typed and crossed.  2 units ordered by EDP -Anemia panel -Monitor H&H  Rectal bleed: Without abdominal pain.  Differentials include diverticular bleed, variceal bleed or hemorrhoid.  Likely contributing to her anemia.  -Two bore PIV lines -Blood products as above -Pepcid twice daily -Monitor H&H -Follow GI recommendations -Clear liquid pending GI  recommendation  Hyponatremia: Acute on chronic.  Differentials include beer potomania and ascites -Monitor BMP  Hypokalemia: Likely due to malnutrition/EtOH -Replete -Check mag and phosphorus and replete as needed  AKI: Prerenal. ?HRS -IV fluid -Recheck BMP in the morning -Avoid nephrotoxic medications  Hyperbilirubinemia: Total bili elevated to 12.2. Jaundice -Check fractionated bili -Abdominal ultrasound  Anion gap metabolic acidosis -Likely due to EtOH  Alcohol use disorder: Half bottle of vodka last night -CIWA -Monitor and replete electrolytes  Protein calorie malnutrition: -Nutrition consult  Tobacco use disorder -Nicotine patch  Bipolar disorder: Stable.  Not on any medication.  -Monitor  Polysubstance use: Admits marijuana.   -UDS  DVT prophylaxis: SCD Code Status: Full code but doesn't want to live hooked to a vent. States that sister, Tobe Sos [id number]) can make a decision when she is not  able Family Communication: Sister and boyfriend at bedside. Disposition Plan: Admit to stepdown Consults called: GI consulted by EDP Admission status: Inpatient status   Mercy Riding MD [facility] Pager [phone]  If 7PM-7AM, please contact night-coverage [email or web address] Password [email or web address]  01/22/2018, 5:02 PM

## 2018-01-22 NOTE — ED Notes (Signed)
GI consult at bedside.

## 2018-01-22 NOTE — ED Notes (Signed)
[phone], Debbie Grimes, to be contacted with updates.

## 2018-01-23 ENCOUNTER — Inpatient Hospital Stay (HOSPITAL_COMMUNITY): Payer: Medicaid Other

## 2018-01-23 ENCOUNTER — Encounter (HOSPITAL_COMMUNITY): Payer: Self-pay | Admitting: *Deleted

## 2018-01-23 ENCOUNTER — Encounter (HOSPITAL_COMMUNITY)
Admission: EM | Disposition: A | Discharge status: Hospice | Payer: Self-pay | Source: Home / Self Care | Attending: Internal Medicine

## 2018-01-23 DIAGNOSIS — F1010 Alcohol abuse, uncomplicated: Secondary | ICD-10-CM

## 2018-01-23 DIAGNOSIS — E876 Hypokalemia: Secondary | ICD-10-CM

## 2018-01-23 DIAGNOSIS — E871 Hypo-osmolality and hyponatremia: Secondary | ICD-10-CM

## 2018-01-23 DIAGNOSIS — L8990 Pressure ulcer of unspecified site, unspecified stage: Secondary | ICD-10-CM

## 2018-01-23 DIAGNOSIS — Z9911 Dependence on respirator [ventilator] status: Secondary | ICD-10-CM

## 2018-01-23 DIAGNOSIS — N179 Acute kidney failure, unspecified: Secondary | ICD-10-CM

## 2018-01-23 HISTORY — PX: ESOPHAGOGASTRODUODENOSCOPY: SHX5428

## 2018-01-23 LAB — COMPREHENSIVE METABOLIC PANEL
ALK PHOS: 75 U/L (ref 38–126)
ALT: 17 U/L (ref 0–44)
ANION GAP: 13 (ref 5–15)
AST: 40 U/L (ref 15–41)
Albumin: 1.7 g/dL — ABNORMAL LOW (ref 3.5–5.0)
BILIRUBIN TOTAL: 12.1 mg/dL — AB (ref 0.3–1.2)
BUN: 26 mg/dL — ABNORMAL HIGH (ref 6–20)
CALCIUM: 6.7 mg/dL — AB (ref 8.9–10.3)
CO2: 16 mmol/L — ABNORMAL LOW (ref 22–32)
Chloride: 103 mmol/L (ref 98–111)
Creatinine, Ser: 1.39 mg/dL — ABNORMAL HIGH (ref 0.44–1.00)
GFR, EST AFRICAN AMERICAN: 50 mL/min — AB (ref >60)
GFR, EST NON AFRICAN AMERICAN: 43 mL/min — AB (ref >60)
Glucose, Bld: 103 mg/dL — ABNORMAL HIGH (ref 70–99)
Potassium: 3.3 mmol/L — ABNORMAL LOW (ref 3.5–5.1)
SODIUM: 132 mmol/L — AB (ref 135–145)
TOTAL PROTEIN: 4.8 g/dL — AB (ref 6.5–8.1)

## 2018-01-23 LAB — BLOOD GAS, ARTERIAL
Acid-base deficit: 8.7 mmol/L — ABNORMAL HIGH (ref 0.0–2.0)
Bicarbonate: 14.7 mmol/L — ABNORMAL LOW (ref 20.0–28.0)
Drawn by: 295031
FIO2: 50
O2 Saturation: 99.4 %
PEEP: 5 cmH2O
Patient temperature: 98.6
RATE: 12 resp/min
VT: 450 mL
pCO2 arterial: 24.3 mmHg — ABNORMAL LOW (ref 32.0–48.0)
pH, Arterial: 7.399 (ref 7.350–7.450)
pO2, Arterial: 184 mmHg — ABNORMAL HIGH (ref 83.0–108.0)

## 2018-01-23 LAB — PROTIME-INR
INR: 1.39
Prothrombin Time: 17 seconds — ABNORMAL HIGH (ref 11.4–15.2)

## 2018-01-23 LAB — CBC
HCT: 22.3 % — ABNORMAL LOW (ref 36.0–46.0)
Hemoglobin: 8 g/dL — ABNORMAL LOW (ref 12.0–15.0)
MCH: 30.3 pg (ref 26.0–34.0)
MCHC: 35.9 g/dL (ref 30.0–36.0)
MCV: 84.5 fL (ref 78.0–100.0)
PLATELETS: 106 10*3/uL — AB (ref 150–400)
RBC: 2.64 MIL/uL — AB (ref 3.87–5.11)
RDW: 16.7 % — AB (ref 11.5–15.5)
WBC: 7.8 10*3/uL (ref 4.0–10.5)

## 2018-01-23 LAB — BODY FLUID CELL COUNT WITH DIFFERENTIAL
Eos, Fluid: 0 %
Lymphs, Fluid: 11 %
Monocyte-Macrophage-Serous Fluid: 19 % — ABNORMAL LOW (ref 50–90)
Neutrophil Count, Fluid: 70 % — ABNORMAL HIGH (ref 0–25)
Total Nucleated Cell Count, Fluid: 26 cu mm (ref 0–1000)

## 2018-01-23 LAB — GRAM STAIN

## 2018-01-23 LAB — PREPARE RBC (CROSSMATCH)

## 2018-01-23 LAB — ALBUMIN, PLEURAL OR PERITONEAL FLUID

## 2018-01-23 LAB — HEMOGLOBIN AND HEMATOCRIT, BLOOD
HEMATOCRIT: 29.2 % — AB (ref 36.0–46.0)
Hemoglobin: 10.3 g/dL — ABNORMAL LOW (ref 12.0–15.0)

## 2018-01-23 LAB — APTT: APTT: 37 s — AB (ref 24–36)

## 2018-01-23 LAB — PATHOLOGIST SMEAR REVIEW

## 2018-01-23 SURGERY — EGD (ESOPHAGOGASTRODUODENOSCOPY)
Anesthesia: Moderate Sedation

## 2018-01-23 MED ORDER — SODIUM CHLORIDE 0.9 % IV SOLN
750.0000 mL | INTRAVENOUS | Status: DC | PRN
  Administered 2018-01-23: 750 mL via INTRAVENOUS

## 2018-01-23 MED ORDER — MIDAZOLAM HCL 2 MG/2ML IJ SOLN: 1.0000 mg | INTRAMUSCULAR | Status: DC | PRN

## 2018-01-23 MED ORDER — POTASSIUM CHLORIDE IN NACL 40-0.9 MEQ/L-% IV SOLN
INTRAVENOUS | Status: DC
  Administered 2018-01-23: 50 mL/h via INTRAVENOUS
  Administered 2018-01-25: 10 mL/h via INTRAVENOUS
  Filled 2018-01-23 (×4): qty 1000

## 2018-01-23 MED ORDER — PANTOPRAZOLE SODIUM 40 MG IV SOLR
40.0000 mg | Freq: Two times a day (BID) | INTRAVENOUS | Status: DC
  Administered 2018-01-26 – 2018-01-28 (×4): 40 mg via INTRAVENOUS
  Filled 2018-01-23 (×4): qty 40

## 2018-01-23 MED ORDER — MIDAZOLAM HCL 5 MG/ML IJ SOLN
INTRAMUSCULAR | Status: AC
  Filled 2018-01-23: qty 3

## 2018-01-23 MED ORDER — CHLORHEXIDINE GLUCONATE 0.12% ORAL RINSE (MEDLINE KIT)
15.0000 mL | Freq: Two times a day (BID) | OROMUCOSAL | Status: DC
  Administered 2018-01-23 – 2018-01-26 (×3): 15 mL via OROMUCOSAL

## 2018-01-23 MED ORDER — FENTANYL CITRATE (PF) 100 MCG/2ML IJ SOLN
INTRAMUSCULAR | Status: AC
  Filled 2018-01-23: qty 4

## 2018-01-23 MED ORDER — SODIUM CHLORIDE 0.9 % IV SOLN
50.0000 ug/h | INTRAVENOUS | Status: DC
  Administered 2018-01-23 – 2018-01-25 (×6): 50 ug/h via INTRAVENOUS
  Filled 2018-01-23 (×11): qty 1

## 2018-01-23 MED ORDER — FENTANYL CITRATE (PF) 100 MCG/2ML IJ SOLN
50.0000 ug | INTRAMUSCULAR | Status: DC | PRN
  Filled 2018-01-23: qty 2

## 2018-01-23 MED ORDER — ETOMIDATE 2 MG/ML IV SOLN
0.3000 mg/kg | Freq: Once | INTRAVENOUS | Status: AC
  Administered 2018-01-23: 13.92 mg via INTRAVENOUS

## 2018-01-23 MED ORDER — ROCURONIUM BROMIDE 50 MG/5ML IV SOLN
1.0000 mg/kg | Freq: Once | INTRAVENOUS | Status: AC
  Administered 2018-01-23: 40 mg via INTRAVENOUS

## 2018-01-23 MED ORDER — SODIUM CHLORIDE 0.9 % IV SOLN
80.0000 mg | Freq: Once | INTRAVENOUS | Status: AC
  Administered 2018-01-23: 80 mg via INTRAVENOUS
  Filled 2018-01-23: qty 80

## 2018-01-23 MED ORDER — FENTANYL CITRATE (PF) 100 MCG/2ML IJ SOLN
INTRAMUSCULAR | Status: AC
  Administered 2018-01-23: 100 ug
  Filled 2018-01-23: qty 6

## 2018-01-23 MED ORDER — LIDOCAINE HCL 1 % IJ SOLN
INTRAMUSCULAR | Status: AC
  Filled 2018-01-23: qty 20

## 2018-01-23 MED ORDER — SODIUM CHLORIDE 0.9 % IV SOLN
8.0000 mg/h | INTRAVENOUS | Status: AC
  Administered 2018-01-23 – 2018-01-26 (×8): 8 mg/h via INTRAVENOUS
  Filled 2018-01-23 (×12): qty 80

## 2018-01-23 MED ORDER — ORAL CARE MOUTH RINSE
15.0000 mL | OROMUCOSAL | Status: DC
  Administered 2018-01-23 – 2018-01-24 (×9): 15 mL via OROMUCOSAL

## 2018-01-23 MED ORDER — EPINEPHRINE PF 1 MG/10ML IJ SOSY
PREFILLED_SYRINGE | INTRAMUSCULAR | Status: AC
  Filled 2018-01-23: qty 10

## 2018-01-23 MED ORDER — FAMOTIDINE IN NACL 20-0.9 MG/50ML-% IV SOLN
20.0000 mg | Freq: Two times a day (BID) | INTRAVENOUS | Status: DC
  Administered 2018-01-23 (×2): 20 mg via INTRAVENOUS
  Filled 2018-01-23: qty 50

## 2018-01-23 MED ORDER — MIDAZOLAM HCL 2 MG/2ML IJ SOLN
INTRAMUSCULAR | Status: AC
  Administered 2018-01-23: 2 mg
  Filled 2018-01-23: qty 6

## 2018-01-23 MED ORDER — FENTANYL CITRATE (PF) 100 MCG/2ML IJ SOLN
50.0000 ug | INTRAMUSCULAR | Status: DC | PRN
  Administered 2018-01-24: 50 ug via INTRAVENOUS
  Filled 2018-01-23: qty 2

## 2018-01-23 MED ORDER — NOREPINEPHRINE 4 MG/250ML-% IV SOLN
0.0000 ug/min | INTRAVENOUS | Status: DC
  Filled 2018-01-23: qty 250

## 2018-01-23 MED ORDER — DEXMEDETOMIDINE HCL IN NACL 200 MCG/50ML IV SOLN
0.4000 ug/kg/h | INTRAVENOUS | Status: DC
  Administered 2018-01-23: 0.4 ug/kg/h via INTRAVENOUS
  Filled 2018-01-23 (×2): qty 50

## 2018-01-23 NOTE — Progress Notes (Signed)
PT Cancellation Note  Patient Details Name: Debbie Grimes MRN: [id number] DOB: 1965/11/20   Cancelled Treatment:    Reason Eval/Treat Not Completed: Patient at procedure or test/unavailable   [facility] 01/23/2018, 10:27 AM

## 2018-01-23 NOTE — Op Note (Signed)
[facility] Patient Name: Debbie Grimes Procedure Date: 01/23/2018 MRN: [id number] Attending MD: Mauri Pole , MD Date of Birth: June 29, 1965 CSN: [id number] Age: 52 Admit Type: Inpatient Procedure:                Upper GI endoscopy Indications:              Active gastrointestinal bleeding Providers:                Mauri Pole, MD, Vista Lawman, RN, Charolette Child, Technician Referring MD:              Medicines:                Monitored Anesthesia Care Complications:            No immediate complications. Estimated Blood Loss:     Estimated blood loss was minimal. Procedure:                Pre-Anesthesia Assessment:                           - Prior to the procedure, a History and Physical                            was performed, and patient medications and                            allergies were reviewed. The patient's tolerance of                            previous anesthesia was also reviewed. The risks                            and benefits of the procedure and the sedation                            options and risks were discussed with the patient.                            All questions were answered, and informed consent                            was obtained. Prior Anticoagulants: The patient has                            taken no previous anticoagulant or antiplatelet                            agents. ASA Grade Assessment: IV - A patient with                            severe systemic disease that is a constant threat  to life. After reviewing the risks and benefits,                            the patient was deemed in satisfactory condition to                            undergo the procedure.                           After obtaining informed consent, the endoscope was                            passed under direct vision. Throughout the                            procedure, the  patient's blood pressure, pulse, and                            oxygen saturations were monitored continuously. The                            GIF-H190 ([id number]) Olympus adult endoscope was                            introduced through the mouth, and advanced to the                            second part of duodenum. The upper GI endoscopy was                            accomplished without difficulty. The patient                            tolerated the procedure well. Scope In: Scope Out: Findings:      LA Grade D (one or more mucosal breaks involving at least 75% of       esophageal circumference) esophagitis with no bleeding was found 24 to       36 cm from the incisors. Didnot visualize any definitive varices,       ?decompressed in the setting of hypotension      Moderate portal hypertensive gastropathy was found in the entire       examined stomach. No gastric varices      The examined duodenum was normal. Impression:               - LA Grade D erosive esophagitis. Cannot exclude                            underlying esophageal varices                           - Portal hypertensive gastropathy.                           - Normal examined duodenum.                           -  No specimens collected. Moderate Sedation:      N/A- Per Anesthesia Care Recommendation:           - Patient has a contact number available for                            emergencies. The signs and symptoms of potential                            delayed complications were discussed with the                            patient. Return to normal activities tomorrow.                            Written discharge instructions were provided to the                            patient.                           - NPO                           - Continue present medications.                           - Continue Octreotide gtt X 72 hours                           - Continue PPI                           -  Ceftriaxone X 5 days                           - If no further bleeding within next 24 hours, ok                            to extubate Procedure Code(s):        --- Professional ---                           [phone], Esophagogastroduodenoscopy, flexible,                            transoral; diagnostic, including collection of                            specimen(s) by brushing or washing, when performed                            (separate procedure) Diagnosis Code(s):        --- Professional ---                           K20.8, Other esophagitis  K76.6, Portal hypertension                           K31.89, Other diseases of stomach and duodenum                           K92.2, Gastrointestinal hemorrhage, unspecified CPT copyright 2017 [employer]. All rights reserved. The codes documented in this report are preliminary and upon coder review may  be revised to meet current compliance requirements. Mauri Pole, MD 01/23/2018 4:39:25 PM This report has been signed electronically. Number of Addenda: 0

## 2018-01-23 NOTE — Progress Notes (Signed)
Patient ID: Debbie Grimes, female   DOB: 03-10-1966, 52 y.o.   MRN: [id number]  PROGRESS NOTE    Debbie Grimes  MCN:[id number] DOB: Jul 04, 1965 DOA: 01/22/2018 PCP: Patient, No Pcp Per   Brief Narrative:  52 year old female with history of alcohol abuse, bipolar disorder, COPD, cirrhosis with ascites, polysubstance abuse, protein calorie malnutrition, anemia and tobacco use presented with weakness along with black/bloody stools.  She was found to have hemoglobin of 4.7.  GI was consulted.  Assessment & Plan:   Active Problems:   ETOH abuse   Tobacco use disorder   Protein-calorie malnutrition, severe   Symptomatic anemia   Acute GI bleeding   Hyponatremia   Hypokalemia   Hyperbilirubinemia   AKI (acute kidney injury) ([place])   Metabolic acidosis with increased anion gap and accumulation of organic acids  Probable upper GI bleeding -There is a concern for variceal bleeding in a patient with history of alcohol abuse and cirrhosis -GI following.  Patient is having coffee-ground emesis this morning.  Currently on octreotide and Protonix drip.  For probable upper GI endoscopy today  Acute on chronic blood loss anemia -Probably secondary to above -Status post 2 units packed red cells transfusion.  Hemoglobin 8 today.  Monitor  Decompensated cirrhosis of liver with ascites with worsening bilirubin with probable hepatic encephalopathy as well -Most likely secondary to alcohol abuse -Empirically has been started on Rocephin for SBP prophylaxis.  For paracentesis today -Patient has also been started on rifaximin by GI.  Lactulose has not been given because of loose stools and hypokalemia -Monitor mental status -Fall precautions -Patient continues to drink alcohol.  Overall prognosis is guarded to poor -Palliative care consult for goals of care  Hyponatremia -Probably acute on chronic -Probably secondary to beer potomania and ascites -Monitor.  Currently not on diuretics.  Will  give gentle hydration for now  Hypokalemia -Replace.  Repeat a.m. labs  Acute kidney injury -Improving.  Continue IV fluids.  Repeat a.m. labs  Continued alcohol abuse -Monitor for alcohol withdrawal.  Continue CIWA protocol.  We will add thiamine, multivitamin and folate  Severe malnutrition -Follow nutrition recommendations  Bipolar disorder -Not on any medication.  Outpatient follow-up  History of polysubstance abuse -Patient admits to marijuana use.  Tobacco use disorder -Nicotine patch   DVT prophylaxis: SCDs Code Status: Full Family Communication: Spoke to multiple family members including sister and daughter at bedside  disposition Plan: Depends on clinical outcome  Consultants: GI  Procedures: None  Antimicrobials: Rocephin from 01/22/2018 onwards   Subjective: Patient seen and examined at bedside.  She is a poor historian, awake.  Complains of nausea and vomiting.  Wondering when she would eat or drink anything.  Denies any worsening abdominal pain or fever.  Objective: Vitals:   01/23/18 1100 01/23/18 1200 01/23/18 1220 01/23/18 1300  BP: (!) 145/69 (!) 146/59  (!) 143/112  Pulse: 84 89  (!) 121  Resp: 12 (!) 9  (!) 22  Temp:      TempSrc:      SpO2: 98% 100% 100% 100%  Weight:      Height:   5\' 4"  (1.626 m)     Intake/Output Summary (Last 24 hours) at 01/23/2018 1333 Last data filed at 01/23/2018 1300 Gross per 24 hour  Intake 3097.01 ml  Output 152 ml  Net 2945.01 ml   Filed Weights   01/22/18 1722 01/23/18 0346  Weight: 44.5 kg 46.4 kg    Examination:  General exam: Very thinly  built female, looks older than stated age.  Poor historian.  No distress Respiratory system: Bilateral decreased breath sounds at bases Cardiovascular system: S1 & S2 heard, Rate controlled Gastrointestinal system: Abdomen is distended, soft and nontender. Normal bowel sounds heard. Extremities: No cyanosis, clubbing, edema  Central nervous system: Awake, slow to  respond to questions, poor historian no focal neurological deficits. Moving extremities Skin: No rashes, lesions or ulcers Psychiatry: Looks anxious  Data Reviewed: I have personally reviewed following labs and imaging studies  CBC: Recent Labs  Lab 01/22/18 1403 01/23/18 0439  WBC 9.5 7.8  NEUTROABS 7.5  --   HGB 4.7* 8.0*  HCT 13.3* 22.3*  MCV 89.9 84.5  PLT 164 956*   Basic Metabolic Panel: Recent Labs  Lab 01/22/18 1403 01/22/18 1647 01/23/18 0439  NA 127*  --  132*  K 2.9*  --  3.3*  CL 93*  --  103  CO2 20*  --  16*  GLUCOSE 87  --  103*  BUN 27*  --  26*  CREATININE 1.86*  --  1.39*  CALCIUM 7.6*  --  6.7*  MG  --  1.5*  --   PHOS 2.6  --   --    GFR: Estimated Creatinine Clearance: 34.7 mL/min (A) (by C-G formula based on SCr of 1.39 mg/dL (H)). Liver Function Tests: Recent Labs  Lab 01/22/18 1403 01/22/18 1647 01/23/18 0439  AST 46*  --  40  ALT 18  --  17  ALKPHOS 96  --  75  BILITOT 12.2* 12.1* 12.1*  PROT 5.4*  --  4.8*  ALBUMIN 1.9*  --  1.7*   Recent Labs  Lab 01/22/18 1403  LIPASE 31   Recent Labs  Lab 01/22/18 1902  AMMONIA 67*   Coagulation Profile: Recent Labs  Lab 01/23/18 0439  INR 1.39   Cardiac Enzymes: Recent Labs  Lab 01/22/18 1403  TROPONINI <0.03   BNP (last 3 results) No results for input(s): PROBNP in the last 8760 hours. HbA1C: No results for input(s): HGBA1C in the last 72 hours. CBG: No results for input(s): GLUCAP in the last 168 hours. Lipid Profile: No results for input(s): CHOL, HDL, LDLCALC, TRIG, CHOLHDL, LDLDIRECT in the last 72 hours. Thyroid Function Tests: No results for input(s): TSH, T4TOTAL, FREET4, T3FREE, THYROIDAB in the last 72 hours. Anemia Panel: Recent Labs    01/22/18 1646 01/22/18 1647  VITAMINB12 1,883*  --   FOLATE  --  8.7  FERRITIN 300  --   TIBC 116*  --   IRON 104  --   RETICCTPCT  --  2.9   Sepsis Labs: No results for input(s): PROCALCITON, LATICACIDVEN in the  last 168 hours.  No results found for this or any previous visit (from the past 240 hour(s)).       Radiology Studies: Dg Abd 1 View  Result Date: 01/23/2018 CLINICAL DATA:  Hematemesis, anemia EXAM: ABDOMEN - 1 VIEW COMPARISON:  CT abdomen/pelvis dated 11/09/2017 FINDINGS: Nonobstructive bowel gas pattern. Mild degenerative changes of the lower thoracic spine. IMPRESSION: Unremarkable abdominal radiograph. Electronically Signed   By: Julian Hy M.D.   On: 01/23/2018 01:09   US Abdomen Complete  Result Date: 01/22/2018 CLINICAL DATA:  Bilirubinemia. Alcoholism, polysubstance abuse. Hepatitis-C EXAM: ABDOMEN ULTRASOUND COMPLETE COMPARISON:  CT abdomen pelvis 11/09/2017 FINDINGS: Gallbladder: Gallbladder sludge with small gallstones, 4 mm. Gallbladder wall mildly thickened 3.1 mm likely due to ascites. Negative sonographic Murphy sign. Common bile duct: Diameter: 4 mm  Liver: Diffusely hyperechoic liver parenchyma without focal lesion. Portal vein is patent on color Doppler imaging with normal direction of blood flow towards the liver. IVC: No abnormality visualized. Pancreas: Visualized portion unremarkable. Spleen: Size and appearance within normal limits. Right Kidney: Length: 10.6 cm. Echogenicity within normal limits. No mass or hydronephrosis visualized. Left Kidney: Length: 10.4 cm. Echogenicity within normal limits. No mass or hydronephrosis visualized. Abdominal aorta: No aneurysm visualized. Other findings: Moderate ascites. IMPRESSION: Gallbladder sludge and small gallstones.  No biliary dilatation. Moderate ascites Electronically Signed   By: Franchot Gallo M.D.   On: 01/22/2018 18:30   Dg Chest Port 1 View  Result Date: 01/23/2018 CLINICAL DATA:  Endotracheal tube placement. EXAM: PORTABLE CHEST 1 VIEW COMPARISON:  10/09/2017. FINDINGS: Endotracheal tube noted with tip 4.0 cm above the carina. Heart size normal. Low lung volumes with mild bibasilar atelectasis/infiltrates. No  pleural effusion or pneumothorax. IMPRESSION: 1.  Endotracheal tube noted with tip 4.0 cm above the carina. 2.  Low lung volumes with mild bibasilar atelectasis/infiltrates. Electronically Signed   By: [place]   On: 01/23/2018 12:49        Scheduled Meds: . fluticasone furoate-vilanterol  1 puff Inhalation Q2000  . lidocaine      . nicotine  14 mg Transdermal Daily  . [START ON 01/26/2018] pantoprazole  40 mg Intravenous Q12H  . prednisoLONE acetate  1 drop Left Eye Daily  . rifaximin  550 mg Oral BID  . trimethoprim-polymyxin b  1 drop Both Eyes Q6H   Continuous Infusions: . sodium chloride    . cefTRIAXone (ROCEPHIN)  IV Stopped (01/22/18 1940)  . dexmedetomidine (PRECEDEX) IV infusion 0.4 mcg/kg/hr (01/23/18 1225)  . octreotide  (SANDOSTATIN)    IV infusion 50 mcg/hr (01/23/18 1142)  . pantoprozole (PROTONIX) infusion 8 mg/hr (01/23/18 1111)     LOS: 1 day        Aline August, MD [facility] Pager [phone]  If 7PM-7AM, please contact night-coverage [email or web address] Password [email or web address] 01/23/2018, 1:33 PM

## 2018-01-23 NOTE — Progress Notes (Signed)
Initial Nutrition Assessment  DOCUMENTATION CODES:   Severe malnutrition in context of social or environmental circumstances, Underweight  INTERVENTION:  - Diet advancement if medically feasible. - If diet unable to be advanced, recommend small bore NGT placement with Osmolite 1.2 @ 20 mL/hr to advance by 10 mL every 24 hours to reach goal rate of Osmolite 1.2 @ 60 mL/hr. At goal rate, this regimen will provide 1728 kcal, 80 grams of protein, and 1181 mL free water.  - If TF initiated, monitor magnesium, potassium, and phosphorus daily for at least 3 days, MD to replete as needed, as pt is at risk for refeeding syndrome given severe malnutrition, alcohol abuse hx/ongoing alcohol abuse.   NUTRITION DIAGNOSIS:   Severe Malnutrition related to social / environmental circumstances as evidenced by severe muscle depletion, severe fat depletion.  GOAL:   Patient will meet greater than or equal to 90% of their needs  MONITOR:   Diet advancement, Weight trends, Labs  REASON FOR ASSESSMENT:   Malnutrition Screening Tool, Consult Assessment of nutrition requirement/status  ASSESSMENT:   52 y.o. female with medical history significant for alcohol use disorder, BPD-1, COPD, cirrhosis/ascites, polysubstance use, protein calorie malnutrition, anemia, and tobacco use. She presented to the ED with generalized weakness. History provided by patient, patient's sister and boyfriend. They report that she has been intermittently weak for over a year but worse over the last 1 week. She also had poor p.o. intake over the last 1 week and reports significant weight loss. Her skin has been yellowish. She fell about 2 days PTA but denies hitting her head or loss of consciousness. Reports smoking about 10 cigarettes a day, smoking marijuana, and drinking daily; she drank about a half bottle of vodka the night PTA. Patient admitted for symptomatic anemia and electrolyte imbalance.  Patient has been NPO since  admission. Sister and daughter, who are at bedside, provide information other than patient denying abdominal pain, stating she has been nauseated and that she vomited blood earlier this AM. Sister reports that patient's boyfriend told her that patient did not eat for 4 days PTA. Family reports that patient will sometimes go a day or more without eating when she is drinking heavily. At baseline, she "nibbles" on snack foods during the day, does not eat meals. She does not have any teeth and, therefore, needs softer foods.   Unable to obtain any additional nutrition-related information at this time as patient is being prepared to be transferred to a different room. Current weight is 102 lb and weight on 105/18 was 107 lb. This indicates 5 lb weight loss (4.7% body weight loss) in the past 1 year; not significant for time frame.  She went to IR for R paracentesis this AM and 500 mL yellow fluid removed.    Medications reviewed; 2 g IV Mg sulfate x1 run yesterday, 80 mg IV Protonix x1 dose today, 8 mg/hr IV Protonix x72 hours starting 10/1 at 1100. Labs reviewed; Na: 132 mmol/L, K: 3.3 mmol/L, BUN: 26 mg/dL, creatinine: 1.39 mg/dL, Ca: 6.7 mg/dL, GFR: 43 mL/min.  IVF; banana bag @ 75 mL/hr x1 bag started yesterday evening, now completed.      NUTRITION - FOCUSED PHYSICAL EXAM:    Most Recent Value  Orbital Region  Moderate depletion  Upper Arm Region  Severe depletion  Thoracic and Lumbar Region  Severe depletion  Buccal Region  Severe depletion  Temple Region  Unable to assess  Clavicle Bone Region  Severe depletion  Clavicle and  Acromion Bone Region  Severe depletion  Scapular Bone Region  Unable to assess  Dorsal Hand  Severe depletion  Patellar Region  Severe depletion  Anterior Thigh Region  Severe depletion  Posterior Calf Region  Severe depletion  Edema (RD Assessment)  None  Hair  Reviewed  Eyes  Reviewed  Mouth  Reviewed  Skin  Reviewed  Nails  Reviewed       Diet Order:    Diet Order            Diet NPO time specified  Diet effective now              EDUCATION NEEDS:   Not appropriate for education at this time  Skin:  Skin Assessment: Skin Integrity Issues: Skin Integrity Issues:: Stage II Stage II: coccyx  Last BM:  9/30  Height:   Ht Readings from Last 1 Encounters:  01/22/18 5\' 6"  (1.676 m)    Weight:   Wt Readings from Last 1 Encounters:  01/23/18 46.4 kg    Ideal Body Weight:  59.09 kg  BMI:  Body mass index is 16.51 kg/m.  Estimated Nutritional Needs:   Kcal:  1625-1855 (35-40 kcal/kg)  Protein:  70-83 grams (1.5-1.8 grams/kg)  Fluid:  >/= 1.7 L/day     Jarome Matin, MS, RD, LDN, [facility] Inpatient Clinical Dietitian Pager # [phone] After hours/weekend pager # [phone]

## 2018-01-23 NOTE — Progress Notes (Signed)
PCCM:  Long discussion with family at the bedside regarding code status. The patient has not been married. She does have a boyfriend. No medical power of attorney defined per the family. She does have 2 daughters present as well as a sister.   They all agree that a Full DNR status would be most appropriate.   Orders have been placed.  Garner Nash, DO [facility] 01/23/2018 5:41 PM  Personal pager: [phone] If unanswered, please page CCM On-call: [phone]

## 2018-01-23 NOTE — Procedures (Signed)
PROCEDURE SUMMARY:  Successful US guided paracentesis from right lateral abdomen.  Yielded 500 mL of yellow fluid.  No immediate complications.  Pt tolerated well.   Specimen was sent for labs.  Docia Barrier PA-C 01/23/2018 10:47 AM

## 2018-01-23 NOTE — Progress Notes (Addendum)
[facility] Progress Note   Chief Complaint:   Recurrent anemia, now vomiting dark brown emesis   SUBJECTIVE:    no specific complaints. Vomited dark emesis x 3. Just back from dx paracentesis   ASSESSMENT AND PLAN:   1.  Decompensated cirrhosis/ severe anemia. MELD 24. DF < 2.   52 year old female with longstanding history of alcohol abuse (ongoing) / recent finding of possible cirrhosis / ascites in July.  Patient admitted decompensated cirrhosis, severe anemia, confusion at home. Now with overt GI bleeding (hematemesis) -Patient will need bedside EGD today.PCCM to intubate her -already on octreotide -will change H2 to IV PPI.  -continue Rocephin for SBP prophylaxis -holding diuretics for AKI. Cr better 1.86 >>>1.39 -s/p dx paracentesis with cell count, gram stain, culture, albumin. Follow up results. -continue Xifaxan.  No lactulose for now given that she is already having loose stools and K+ is low.   -HCC: No focal liver lesions on CT scan mid July, AFP pending -She will need hepatitis B vaccines at some point.  Her hep A total antibody is positive -Severe hypoalbuminemia, for most part this is likely from malnourishment.    3.  Barrett's esophagus, due for surveillance EGD June 2021  4. History of colonic tubular adenomas, due for surveillance June 2022   OBJECTIVE:     Vital signs in last 24 hours: Temp:  [97.3 F (36.3 C)-99.3 F (37.4 C)] 99.3 F (37.4 C) (10/01 0800) Pulse Rate:  [62-112] 86 (10/01 0800) Resp:  [11-33] 16 (10/01 0700) BP: (81-152)/(35-68) 133/47 (10/01 0800) SpO2:  [89 %-99 %] 96 % (10/01 0700) Weight:  [44.5 kg-46.4 kg] 46.4 kg (10/01 0346) Last BM Date: 01/22/18 General:  Chronically ill appearing female in NAD EENT:  Normal hearing, non icteric sclera, conjunctive pink.  Heart:  Regular rate and rhythm. Trace BLE Pulm: Normal respiratory effort\ Abdomen:  Soft, moderately distended, diffusely tender.  Normal  bowel sounds, no masses felt.     Neurologic:  Alert and  oriented x4;  grossly normal neurologically. Psych:  Pleasant, cooperative.  Normal mood and affect.   Intake/Output from previous day: 09/30 0701 - 10/01 0700 In: 2821.2 [I.V.:129.2; Blood:588; IV Piggyback:2104] Out: 152 [Urine:1; Emesis/NG output:150; Stool:1] Intake/Output this shift: No intake/output data recorded.  Lab Results: Recent Labs    01/22/18 1403 01/23/18 0439  WBC 9.5 7.8  HGB 4.7* 8.0*  HCT 13.3* 22.3*  PLT 164 106*   BMET Recent Labs    01/22/18 1403 01/23/18 0439  NA 127* 132*  K 2.9* 3.3*  CL 93* 103  CO2 20* 16*  GLUCOSE 87 103*  BUN 27* 26*  CREATININE 1.86* 1.39*  CALCIUM 7.6* 6.7*   LFT Recent Labs    01/22/18 1647 01/23/18 0439  PROT  --  4.8*  ALBUMIN  --  1.7*  AST  --  40  ALT  --  17  ALKPHOS  --  75  BILITOT 12.1* 12.1*  BILIDIR 6.8*  --   IBILI 5.3*  --    PT/INR Recent Labs    01/23/18 0439  LABPROT 17.0*  INR 1.39   Hepatitis Panel   Dg Abd 1 View  Result Date: 01/23/2018 CLINICAL DATA:  Hematemesis, anemia EXAM: ABDOMEN - 1 VIEW COMPARISON:  CT abdomen/pelvis dated 11/09/2017 FINDINGS: Nonobstructive bowel gas pattern. Mild degenerative changes of the lower thoracic spine. IMPRESSION: Unremarkable abdominal radiograph. Electronically Signed   By: Julian Hy M.D.   On: 01/23/2018 01:09  US Abdomen Complete  Result Date: 01/22/2018 CLINICAL DATA:  Bilirubinemia. Alcoholism, polysubstance abuse. Hepatitis-C EXAM: ABDOMEN ULTRASOUND COMPLETE COMPARISON:  CT abdomen pelvis 11/09/2017 FINDINGS: Gallbladder: Gallbladder sludge with small gallstones, 4 mm. Gallbladder wall mildly thickened 3.1 mm likely due to ascites. Negative sonographic Murphy sign. Common bile duct: Diameter: 4 mm Liver: Diffusely hyperechoic liver parenchyma without focal lesion. Portal vein is patent on color Doppler imaging with normal direction of blood flow towards the liver. IVC: No  abnormality visualized. Pancreas: Visualized portion unremarkable. Spleen: Size and appearance within normal limits. Right Kidney: Length: 10.6 cm. Echogenicity within normal limits. No mass or hydronephrosis visualized. Left Kidney: Length: 10.4 cm. Echogenicity within normal limits. No mass or hydronephrosis visualized. Abdominal aorta: No aneurysm visualized. Other findings: Moderate ascites. IMPRESSION: Gallbladder sludge and small gallstones.  No biliary dilatation. Moderate ascites Electronically Signed   By: Franchot Gallo M.D.   On: 01/22/2018 18:30    Active Problems:   ETOH abuse   Tobacco use disorder   Protein-calorie malnutrition, severe   Symptomatic anemia   GI bleed   Hyponatremia   Hypokalemia   Hyperbilirubinemia   AKI (acute kidney injury) ([place])   Metabolic acidosis with increased anion gap and accumulation of organic acids     LOS: 1 day   Tye Savoy ,NP 01/23/2018, 10:00 AM    Attending physician's note   I have taken an interval history, reviewed the chart and examined the patient. I agree with the Advanced Practitioner's note, impression and recommendations.   She had 2 episodes of hematemesis overnight and one episode this morning.  She started vomiting after blood transfusion .started on octreotide gtt Concerning for acute variceal hemorrhage Plan for bedside EGD .  Requested PCCM to intubate patient to protect airway and prevent aspiration Decompensated cirrhosis, acute alcoholic hepatitis Meld sodium score 29 on admission.  Overall poor prognosis with high morbidity and mortality risk. The risks and benefits as well as alternatives of endoscopic procedure(s) have been discussed and reviewed. All questions answered. The patient agrees to proceed.   Damaris Hippo , MD [phone]

## 2018-01-23 NOTE — Consult Note (Signed)
NAME:  Debbie Grimes, MRN:  [id number], DOB:  [birth year], LOS: 1 ADMISSION DATE:  01/22/2018, CONSULTATION DATE:  10/1 REFERRING MD:  Silverio Decamp , CHIEF COMPLAINT:  GIB concern for airway protection    Brief History   52 year old female patient with alcohol related cirrhosis admitted with symptomatic anemia in the setting of upper GI bleed on 9/30.  Initial hemoglobin 4.6.  Developed hematemesis on 10/1, critical care asked to assist with airway management for urgent endoscopy.  Past Medical History  Alcohol abuse, hepatitis C, decompensated cirrhosis with prior need for paracentesis, and occult noncompliance.  Barrett's esophagus  Significant Hospital Events   9/30: Admitted with GI bleed with symptomatic anemia and decompensated cirrhosis. Hemoglobin 4.6, received 2 units PRBC.  GI consulted. 10/1: hemogobin update.  Developed hematemesis Consults: date of consult/date signed off & final recs:  GI 9/30 for GIB IR consulted for paracentesis on 10/1 10/1 PCCM consulted for airway management   Procedures (surgical and bedside):  Paracentesis 10/1: 500 mL of yellow ascites drained from right lateral abdomen  Significant Diagnostic Tests:   Paracentesis labs: Gm stain 10/1>>> Albumin 10/1>> Body fluid ct 10/1>>>   Ammonia 9/30: 67   Micro Data:  Ascites Culture 10/1>>>  Antimicrobials:  Rocephin 10/1>>>  Subjective:  No distress prior to intubation   Objective   Blood pressure (Abnormal) 133/47, pulse 86, temperature 99.3 F (37.4 C), temperature source Oral, resp. rate 16, height 5\' 6"  (1.676 m), weight 46.4 kg, SpO2 96 %.        Intake/Output Summary (Last 24 hours) at 01/23/2018 1103 Last data filed at 01/23/2018 0600 Gross per 24 hour  Intake 2821.22 ml  Output 152 ml  Net 2669.22 ml   Filed Weights   01/22/18 1722 01/23/18 0346  Weight: 44.5 kg 46.4 kg    Examination: General: frail 52 year old female. Resting in bed no acute distress  HENT: NCAT sclera  are icteric MMM edentulous  Lungs: clear no accessory use  Cardiovascular: RRR  Abdomen: soft not tender  Extremities: brisk CR warm and dry  Neuro: awake and oriented  GU: voids.   Resolved Hospital Problem list   barrettt's esophagus (on surveillance) H/o colonic tubular adenomas (surveillance)   Assessment & Plan:   Acute blood loss anemia due to UGIB w/ hematemesis in cirrhotic  Presumptively variceal bleed.  -s/p 2 units PRBC Plan For urgent EGD today Cont PPI infusion  Cont octreotide infusion Serial cbcs transfuse for hgb < 7 or active and symptomatic bleeding Trend coags  Will plan on intubation for airway protection   Decompensated cirrhosis MELD 24.  -not candidate for TIPS  -GI following -paracentesis completed Plan Cont rocephin day 2 for SBP prophylaxis F/u paracentesis labs and cultures xifaxan  Cont supportive care  Will need palliative care consult Alcohol cessation    Acute kidney injury -Improved with IV hydration Plan Renal dose medications Avoid hypotension Follow-up a.m. Chemistry  Fluid and electrolyte imbalance: Hyponatremia and hypokalemia -Both improved Plan Continuing gentle IV hydration Replace potassium Follow-up chemistry a.m.  Mixed NAG and mild AG metabolic acidosis  Plan Repeat chemistry later this afternoon Avoid hypotension Consider bicarbonate infusion if NAG continues  Hyperbilirubinemia  W/ jaundice Plan F/u abd [place] Further recs per GI   Bipolar disease  Plan Cont home rx  H/o tobacco abuse Plan Cont home rx  Severe protein calorie malnutrition Plan Dietary support.   Disposition / Summary of Today's Plan 01/23/18   For urgent EGD today  Diet: NPO Pain/Anxiety/Delirium protocol (if indicated): PAD while intubated  VAP protocol (if indicated): ordered DVT prophylaxis: SCD GI prophylaxis: PPI Hyperglycemia protocol: na  Mobility: BR Code Status: full code  Family Communication: NA   Labs     CBC: Recent Labs  Lab 01/22/18 1403 01/23/18 0439  WBC 9.5 7.8  NEUTROABS 7.5  --   HGB 4.7* 8.0*  HCT 13.3* 22.3*  MCV 89.9 84.5  PLT 164 106*    Basic Metabolic Panel: Recent Labs  Lab 01/22/18 1403 01/22/18 1647 01/23/18 0439  NA 127*  --  132*  K 2.9*  --  3.3*  CL 93*  --  103  CO2 20*  --  16*  GLUCOSE 87  --  103*  BUN 27*  --  26*  CREATININE 1.86*  --  1.39*  CALCIUM 7.6*  --  6.7*  MG  --  1.5*  --   PHOS 2.6  --   --    GFR: Estimated Creatinine Clearance: 34.7 mL/min (A) (by C-G formula based on SCr of 1.39 mg/dL (H)). Recent Labs  Lab 01/22/18 1403 01/23/18 0439  WBC 9.5 7.8    Liver Function Tests: Recent Labs  Lab 01/22/18 1403 01/22/18 1647 01/23/18 0439  AST 46*  --  40  ALT 18  --  17  ALKPHOS 96  --  75  BILITOT 12.2* 12.1* 12.1*  PROT 5.4*  --  4.8*  ALBUMIN 1.9*  --  1.7*   Recent Labs  Lab 01/22/18 1403  LIPASE 31   Recent Labs  Lab 01/22/18 1902  AMMONIA 67*    ABG No results found for: PHART, PCO2ART, PO2ART, HCO3, TCO2, ACIDBASEDEF, O2SAT   Coagulation Profile: Recent Labs  Lab 01/23/18 0439  INR 1.39    Cardiac Enzymes: Recent Labs  Lab 01/22/18 1403  TROPONINI <0.03    HbA1C: No results found for: HGBA1C  CBG: No results for input(s): GLUCAP in the last 168 hours.  Admitting History of Present Illness.   52 year old female with a significant history of alcohol related cirrhosis, meld score 24, still actively drinking.  9/30 with about 1 week history of worsening weakness.  Poor p.o. intake.  Worsening yellow discoloration of skin.  Admitted to the internal medicine service with a working diagnosis of symptomatic anemia with hemoglobin of 4.7, as well as presumptive diagnosis of upper GI bleed complicated by decompensated cirrhosis with ascites.  GI medicine was consulted, lactic ceftriaxone was started, diagnostic paracentesis ordered.  She was transfused 2 units of blood.  On 10/1 hemoglobin was up to  8.  Patient is she was seen by GI medicine with plan for upper endoscopy, however the urgency for need of ED escalated after patient had 3 episodes of hematemesis.  Critical care asked to assist with airway management and critical care services  Review of Systems:   Review of Systems - History obtained from the patient General ROS: positive for  - fatigue, malaise and weight loss Psychological ROS: positive for - anxiety and depression ENT ROS: negative Hematological and Lymphatic ROS: positive for - bleeding problems, fatigue and jaundice Endocrine ROS: negative Respiratory ROS: no cough, shortness of breath, or wheezing Cardiovascular ROS: no chest pain or dyspnea on exertion Gastrointestinal ROS: positive for - abdominal pain, blood in stools and hematemesis Musculoskeletal ROS: negative Neurological ROS: no TIA or stroke symptoms Dermatological ROS: positive for dry skin    Past Medical History  She,  has a past medical history of  Alcoholism ([place]) (2000), Bipolar 1 disorder ([place]) (2010), COPD (chronic obstructive pulmonary disease) ([place]), Falls frequently, Hepatic steatosis (09/2015), Hepatitis C, Hepatitis C antibody test positive (09/2015), Polysubstance abuse ([place]) (2000), Protein-calorie malnutrition ([place]) (2012), and Symptomatic anemia (10/09/2017).   Surgical History    Past Surgical History:  Procedure Laterality Date  . BIOPSY  10/11/2017   Procedure: BIOPSY;  Surgeon: Ladene Artist, MD;  Location: [place];  Service: Endoscopy;;  . COLONOSCOPY WITH PROPOFOL N/A 10/11/2017   Procedure: COLONOSCOPY WITH PROPOFOL;  Surgeon: Ladene Artist, MD;  Location[facility]COPY;  Service: Endoscopy;  Laterality: N/A;  . ESOPHAGOGASTRODUODENOSCOPY (EGD) WITH PROPOFOL N/A 10/11/2017   Procedure: ESOPHAGOGASTRODUODENOSCOPY (EGD) WITH PROPOFOL;  Surgeon: Ladene Artist, MD;  Location: [facility] ENDOSCOPY;  Service: Endoscopy;  Laterality: N/A;  . hallux laceration, repair distal phalanx  fracture Left 01/2011   lawn mower injury.  . IRRIGATION AND DEBRIDEMENT SEBACEOUS CYST  06/2003   nasal   . POLYPECTOMY  10/11/2017   Procedure: POLYPECTOMY;  Surgeon: Ladene Artist, MD;  Location: [facility] ENDOSCOPY;  Service: Endoscopy;;  . TUBAL LIGATION       Social History   Social History   Socioeconomic History  . Marital status: Single    Spouse name: Not on file  . Number of children: Not on file  . Years of education: Not on file  . Highest education level: Not on file  Occupational History  . Not on file  Social Needs  . Financial resource strain: Not on file  . Food insecurity:    Worry: Not on file    Inability: Not on file  . Transportation needs:    Medical: Not on file    Non-medical: Not on file  Tobacco Use  . Smoking status: Current Every Day Smoker    Packs/day: 1.00    Years: 39.00    Pack years: 39.00    Types: Cigarettes  . Smokeless tobacco: Never Used  . Tobacco comment: 2-3 packs/day since age 30  Substance and Sexual Activity  . Alcohol use: Not Currently    Alcohol/week: 14.0 standard drinks    Types: 14 Cans of beer per week    Comment: 09/08/2017 "22oz beer qd"  . Drug use: Never  . Sexual activity: Not Currently    Birth control/protection: None  Lifestyle  . Physical activity:    Days per week: Not on file    Minutes per session: Not on file  . Stress: Not on file  Relationships  . Social connections:    Talks on phone: Not on file    Gets together: Not on file    Attends religious service: Not on file    Active member of club or organization: Not on file    Attends meetings of clubs or organizations: Not on file    Relationship status: Not on file  . Intimate partner violence:    Fear of current or ex partner: Not on file    Emotionally abused: Not on file    Physically abused: Not on file    Forced sexual activity: Not on file  Other Topics Concern  . Not on file  Social History Narrative   Used to work in [employer]  though unemployed since age 42 due to feet pain   Spends most of her days on a recliner watching TV and drinking beverages   Lives with a female partner      ,  reports that she has been smoking cigarettes.  She has a 39.00 pack-year smoking history. She has never used smokeless tobacco. She reports that she drank about 14.0 standard drinks of alcohol per week. She reports that she does not use drugs.   Family History   Her family history is not on file.   Allergies No Known Allergies   Home Medications  Prior to Admission medications   Medication Sig Start Date End Date Taking? Authorizing Provider  ibuprofen (ADVIL,MOTRIN) 200 MG tablet Take 200 mg by mouth daily as needed (pain).   Yes [provider]  pantoprazole (PROTONIX) 40 MG tablet Take 1 tablet (40 mg total) by mouth 2 (two) times daily. 12/22/17  Yes Levin Erp, PA  Potassium Chloride ER 20 MEQ TBCR Take 20 mEq by mouth daily for 2 days. 12/26/17 01/22/18 Yes Lemmon, Lavone Nian, PA  prednisoLONE acetate (PRED FORTE) 1 % ophthalmic suspension Place 1 drop into the left eye daily. 01/04/18  Yes [provider]  spironolactone (ALDACTONE) 25 MG tablet Take 1 tablet (25 mg total) by mouth daily. 5/79/72  Yes Delora Fuel, MD  Tiotropium Bromide-Olodaterol (STIOLTO RESPIMAT) 2.5-2.5 MCG/ACT AERS Inhale 1 Inhaler into the lungs daily as needed (SOB).    Yes [provider]  trimethoprim-polymyxin b (POLYTRIM) ophthalmic solution Place 1 drop into both eyes every 6 (six) hours. 01/05/18  Yes [provider]  AMBULATORY NON FORMULARY MEDICATION Ensure - drink Ensure three times daily. Patient not taking: Reported on 01/22/2018 12/22/17   Levin Erp, PA  folic acid (FOLVITE) 1 MG tablet TAKE 1 TABLET BY MOUTH EVERY DAY Patient not taking: Reported on 01/22/2018 10/16/17   Guadalupe Dawn, MD     Erick Colace ACNP-BC [place] Pager # [phone] OR # [phone] if  no answer

## 2018-01-23 NOTE — Procedures (Signed)
Intubation Procedure Note SARAHY CREEDON [id number] 10-10-1965  Procedure: Intubation Indications: Airway protection and maintenance  Procedure Details Consent: Risks of procedure as well as the alternatives and risks of each were explained to the (patient/caregiver).  Consent for procedure obtained. Time Out: Verified patient identification, verified procedure, site/side was marked, verified correct patient position, special equipment/implants available, medications/allergies/relevent history reviewed, required imaging and test results available.  Performed  Maximum sterile technique was used including antiseptics, cap, gloves, gown, hand hygiene, mask and sheet.  MAC and 3 ; size 3 blade w/ glide scope  Premedicated w/ 78m versed, 588m IV fentanyl, etomidate 1521mnd rocuronium 21m57mEvaluation Hemodynamic Status: BP stable throughout; O2 sats: stable throughout Patient's Current Condition: stable Complications: No apparent complications Patient did tolerate procedure well. Chest X-ray ordered to verify placement.  CXR: pending.   PeteClementeen Graham1/2019  PeteErick ColaceP-BC [place] # [phone] answer

## 2018-01-23 NOTE — Progress Notes (Signed)
OT Cancellation Note  Patient Details Name: Debbie Grimes MRN: [id number] DOB: 05/11/1965   Cancelled Treatment:    Reason Eval/Treat Not Completed: Medical issues which prohibited therapy  Essence Merle 01/23/2018, 11:44 AM  Lesle Chris, OTR/L Acute Rehabilitation Services [phone] [facility] pager [phone] office 01/23/2018

## 2018-01-23 NOTE — Progress Notes (Signed)
Lengthy discussion between Dr. Valeta Harms, this nurse, patients daughters Darden Amber and Beverly) and sister (BJ).  It was mutually determined among all family members that no previous medical power of attorney had been completed.  Both of the patients daughters and sister mutually agreed to change the patients code status to DNR.

## 2018-01-24 DIAGNOSIS — Z66 Do not resuscitate: Secondary | ICD-10-CM

## 2018-01-24 DIAGNOSIS — Z7189 Other specified counseling: Secondary | ICD-10-CM

## 2018-01-24 DIAGNOSIS — K922 Gastrointestinal hemorrhage, unspecified: Secondary | ICD-10-CM

## 2018-01-24 DIAGNOSIS — E43 Unspecified severe protein-calorie malnutrition: Secondary | ICD-10-CM

## 2018-01-24 DIAGNOSIS — K7031 Alcoholic cirrhosis of liver with ascites: Secondary | ICD-10-CM

## 2018-01-24 DIAGNOSIS — K2210 Ulcer of esophagus without bleeding: Secondary | ICD-10-CM

## 2018-01-24 DIAGNOSIS — Z515 Encounter for palliative care: Secondary | ICD-10-CM

## 2018-01-24 DIAGNOSIS — D62 Acute posthemorrhagic anemia: Secondary | ICD-10-CM

## 2018-01-24 DIAGNOSIS — K920 Hematemesis: Secondary | ICD-10-CM

## 2018-01-24 LAB — BPAM RBC
BLOOD PRODUCT EXPIRATION DATE: 201910292359
Blood Product Expiration Date: 201910292359
Blood Product Expiration Date: 201910312359
ISSUE DATE / TIME: 201910010032
ISSUE DATE / TIME: 201909302115
ISSUE DATE / TIME: 201910011536
Unit Type and Rh: 600
Unit Type and Rh: 600
Unit Type and Rh: 600

## 2018-01-24 LAB — CBC WITH DIFFERENTIAL/PLATELET
BASOS ABS: 0 10*3/uL (ref 0.0–0.1)
Basophils Relative: 0 %
Eosinophils Absolute: 0 10*3/uL (ref 0.0–0.7)
Eosinophils Relative: 1 %
HEMATOCRIT: 27.7 % — AB (ref 36.0–46.0)
HEMOGLOBIN: 9.9 g/dL — AB (ref 12.0–15.0)
LYMPHS PCT: 10 %
Lymphs Abs: 0.6 10*3/uL — ABNORMAL LOW (ref 0.7–4.0)
MCH: 30.1 pg (ref 26.0–34.0)
MCHC: 35.7 g/dL (ref 30.0–36.0)
MCV: 84.2 fL (ref 78.0–100.0)
MONO ABS: 0.6 10*3/uL (ref 0.1–1.0)
Monocytes Relative: 9 %
NEUTROS ABS: 5 10*3/uL (ref 1.7–7.7)
NEUTROS PCT: 80 %
Platelets: 79 10*3/uL — ABNORMAL LOW (ref 150–400)
RBC: 3.29 MIL/uL — AB (ref 3.87–5.11)
RDW: 18 % — AB (ref 11.5–15.5)
WBC: 6.2 10*3/uL (ref 4.0–10.5)

## 2018-01-24 LAB — HEMOGLOBIN AND HEMATOCRIT, BLOOD
HEMATOCRIT: 25.2 % — AB (ref 36.0–46.0)
Hemoglobin: 8.9 g/dL — ABNORMAL LOW (ref 12.0–15.0)

## 2018-01-24 LAB — TYPE AND SCREEN
ABO/RH(D): A NEG
ANTIBODY SCREEN: NEGATIVE
Unit division: 0
Unit division: 0
Unit division: 0

## 2018-01-24 LAB — COMPREHENSIVE METABOLIC PANEL
ALT: 21 U/L (ref 0–44)
ANION GAP: 13 (ref 5–15)
AST: 53 U/L — AB (ref 15–41)
Albumin: 1.7 g/dL — ABNORMAL LOW (ref 3.5–5.0)
Alkaline Phosphatase: 72 U/L (ref 38–126)
BUN: 25 mg/dL — AB (ref 6–20)
CHLORIDE: 109 mmol/L (ref 98–111)
CO2: 16 mmol/L — ABNORMAL LOW (ref 22–32)
Calcium: 6.6 mg/dL — ABNORMAL LOW (ref 8.9–10.3)
Creatinine, Ser: 1.36 mg/dL — ABNORMAL HIGH (ref 0.44–1.00)
GFR calc Af Amer: 51 mL/min — ABNORMAL LOW (ref >60)
GFR, EST NON AFRICAN AMERICAN: 44 mL/min — AB (ref >60)
Glucose, Bld: 93 mg/dL (ref 70–99)
POTASSIUM: 3.9 mmol/L (ref 3.5–5.1)
Sodium: 138 mmol/L (ref 135–145)
Total Bilirubin: 12.9 mg/dL — ABNORMAL HIGH (ref 0.3–1.2)
Total Protein: 5 g/dL — ABNORMAL LOW (ref 6.5–8.1)

## 2018-01-24 LAB — AFP TUMOR MARKER: AFP, Serum, Tumor Marker: 4.1 ng/mL (ref 0.0–8.3)

## 2018-01-24 LAB — MAGNESIUM: MAGNESIUM: 1.8 mg/dL (ref 1.7–2.4)

## 2018-01-24 LAB — MRSA PCR SCREENING: MRSA BY PCR: NEGATIVE

## 2018-01-24 MED ORDER — CALCIUM CARBONATE ANTACID 500 MG PO CHEW: 1.0000 | CHEWABLE_TABLET | Freq: Four times a day (QID) | ORAL | Status: DC | PRN

## 2018-01-24 MED ORDER — BOOST / RESOURCE BREEZE PO LIQD CUSTOM
1.0000 | Freq: Two times a day (BID) | ORAL | Status: DC
  Administered 2018-01-24 – 2018-01-26 (×3): 1 via ORAL

## 2018-01-24 MED ORDER — PHENOL 1.4 % MT LIQD
1.0000 | OROMUCOSAL | Status: DC | PRN
  Administered 2018-01-24: 1 via OROMUCOSAL
  Filled 2018-01-24: qty 177

## 2018-01-24 MED ORDER — ORAL CARE MOUTH RINSE
15.0000 mL | Freq: Two times a day (BID) | OROMUCOSAL | Status: DC
  Administered 2018-01-26 – 2018-01-28 (×2): 15 mL via OROMUCOSAL

## 2018-01-24 MED ORDER — ADULT MULTIVITAMIN LIQUID CH
15.0000 mL | Freq: Every day | ORAL | Status: DC
  Administered 2018-01-24 – 2018-01-26 (×2): 15 mL via ORAL
  Filled 2018-01-24 (×5): qty 15

## 2018-01-24 MED ORDER — GUAIFENESIN 100 MG/5ML PO SOLN
5.0000 mL | ORAL | Status: DC | PRN
  Administered 2018-01-24: 100 mg via ORAL
  Filled 2018-01-24: qty 10

## 2018-01-24 NOTE — Progress Notes (Signed)
PT Cancellation Note  Patient Details Name: ALEZA PEW MRN: [id number] DOB: [birth year]   Cancelled Treatment:     PT order received but eval deferred - Palliative Care consult ongoing.  Will follow.   Sherrell Weir 01/24/2018, 4:59 PM

## 2018-01-24 NOTE — Progress Notes (Signed)
NAME:  Debbie Grimes, MRN:  [id number], DOB:  [birth year], LOS: 2 ADMISSION DATE:  01/22/2018, CONSULTATION DATE:  10/1 REFERRING MD:  Silverio Decamp , CHIEF COMPLAINT:  GIB concern for airway protection    Brief History   52 year old female patient with alcohol related cirrhosis admitted with symptomatic anemia in the setting of upper GI bleed on 9/30.  Initial hemoglobin 4.6.  Developed hematemesis on 10/1, critical care asked to assist with airway management for urgent endoscopy.  Past Medical History  Alcohol abuse, hepatitis C, decompensated cirrhosis with prior need for paracentesis, and occult noncompliance.  Barrett's esophagus  Significant Hospital Events   9/30: Admitted with GI bleed with symptomatic anemia and decompensated cirrhosis. Hemoglobin 4.6, received 2 units PRBC.  GI consulted. 10/1: hemogobin update.  Developed hematemesis.  Received another unit of blood.  EGD showed greater than 75% of esophagus with significant erosive esophagitis.  Presumptive portal hypertension and gastropathy.  Was hypotensive at times and there were no varices identified 10/2: Stable overnight no evidence of gross bleeding hemoglobin stable.  Passed spontaneous breathing trial.  Extubated. Consults: date of consult/date signed off & final recs:  GI 9/30 for GIB IR consulted for paracentesis on 10/1 10/1 PCCM consulted for airway management   Procedures (surgical and bedside):  Paracentesis 10/1: 500 mL of yellow ascites drained from right lateral abdomen Endotracheal tube 10/1 through 10/2. EGD completed on 10/1: Showed erosive esophagitis Significant Diagnostic Tests:   Paracentesis labs: Gm stain 10/1>>> negative Albumin 10/1>> less than 1 Body fluid ct 10/1>>> negative   Ammonia 9/30: 67   Micro Data:  Ascites Culture 10/1>>> negative  Antimicrobials:  Rocephin 10/1>>>  Subjective:  Awake, denies distress.  Objective   Blood pressure (Abnormal) 96/44, pulse 86, temperature  (Abnormal) 97.4 F (36.3 C), temperature source Axillary, resp. rate 18, height 5\' 4"  (1.626 m), weight 46.4 kg, SpO2 100 %.    Vent Mode: PRVC FiO2 (%):  [30 %-50 %] 30 % Set Rate:  [12 bmp] 12 bmp Vt Set:  [430 mL-450 mL] 430 mL PEEP:  [5 cmH20] 5 cmH20 Plateau Pressure:  [12 cmH20-13 cmH20] 12 cmH20   Intake/Output Summary (Last 24 hours) at 01/24/2018 0757 Last data filed at 01/24/2018 0600 Gross per 24 hour  Intake 1325.42 ml  Output 100 ml  Net 1225.42 ml   Filed Weights   01/22/18 1722 01/23/18 0346  Weight: 44.5 kg 46.4 kg    Examination: General: This is a frail 52 year old female currently on pressure support ventilation she is in no acute distress and exhibiting no accessory use HEENT normocephalic atraumatic sclera are icteric orally intubated no JVD Pulmonary: Clear to auscultation diminished bases tidal volume in the 400 range on spontaneous breathing trial Cardiac: Regular rate and rhythm Abdomen: Soft nontender no organomegaly Extremities: Jaundice, no edema, brisk cap refill, warm. Neuro: Awake alert oriented follows commands no GU: Bilious urine output 64  Resolved Hospital Problem list   barrettt's esophagus (on surveillance) H/o colonic tubular adenomas (surveillance) hematemesis   Assessment & Plan:   Acute blood loss anemia due to UGIB severe erosive esophagitis and presumptive portal hypertensive gastropathy w/ varices (not identified on endoscopy likely due to hypotension at time) -s/p 3 units PRBC Plan PPI and octreotide.  Gastroenterology Serial hemoglobins transfuse for hemoglobin less than 7  Need for mechanical ventilation -Intubated for airway protection on 10/1 -Stable overnight and passed spontaneous breathing trial Plan Extubate Wean oxygen  Decompensated cirrhosis MELD 24.  -not candidate for TIPS  -  GI following -paracentesis completed Plan Day #3 Rocephin for SBP prophylaxis, would complete 5 to 7 days depending on bleeding  duration Continue Xifaxan We will consult palliative care Alcohol cessation  Acute kidney injury -Improved with IV hydration Plan Renal dose medications Avoid hypotension  Fluid and electrolyte imbalance: Hyponatremia and hypokalemia -Both improved Plan Replace and recheck as indicated  Mixed NAG and mild AG metabolic acidosis  Plan Follow-up a.m. chemistry still pending Intake and output  Hyperbilirubinemia  W/ jaundice -> Probably gallbladder sludge with small gallstones without biliary dilation identified by abdominal ultrasound Plan Trend chemistry   Bipolar disease  Plan Cont home Rx  H/o tobacco abuse Plan Cont home RX  Severe protein calorie malnutrition Plan Advance diet when okay with GI team  Disposition / Summary of Today's Plan 01/24/18   Ready for extubation.  Passed spontaneous breathing trial.  We will extubate.    Diet: NPO Pain/Anxiety/Delirium protocol (if indicated): PAD while intubated will discontinue 10/2 VAP protocol (if indicated): ordered; discontinued 10/2 DVT prophylaxis: SCD GI prophylaxis: PPI Hyperglycemia protocol: na  Mobility: BR Code Status: Goals of care discussed.  Now full DO NOT RESUSCITATE as of 10/1 Family Communication: NA   Labs   CBC: Recent Labs  Lab 01/22/18 1403 01/23/18 0439 01/23/18 1857  WBC 9.5 7.8  --   NEUTROABS 7.5  --   --   HGB 4.7* 8.0* 10.3*  HCT 13.3* 22.3* 29.2*  MCV 89.9 84.5  --   PLT 164 106*  --     Basic Metabolic Panel: Recent Labs  Lab 01/22/18 1403 01/22/18 1647 01/23/18 0439  NA 127*  --  132*  K 2.9*  --  3.3*  CL 93*  --  103  CO2 20*  --  16*  GLUCOSE 87  --  103*  BUN 27*  --  26*  CREATININE 1.86*  --  1.39*  CALCIUM 7.6*  --  6.7*  MG  --  1.5*  --   PHOS 2.6  --   --    GFR: Estimated Creatinine Clearance: 34.7 mL/min (A) (by C-G formula based on SCr of 1.39 mg/dL (H)). Recent Labs  Lab 01/22/18 1403 01/23/18 0439  WBC 9.5 7.8    Liver Function  Tests: Recent Labs  Lab 01/22/18 1403 01/22/18 1647 01/23/18 0439  AST 46*  --  40  ALT 18  --  17  ALKPHOS 96  --  75  BILITOT 12.2* 12.1* 12.1*  PROT 5.4*  --  4.8*  ALBUMIN 1.9*  --  1.7*   Recent Labs  Lab 01/22/18 1403  LIPASE 31   Recent Labs  Lab 01/22/18 1902  AMMONIA 67*    ABG    Component Value Date/Time   PHART 7.399 01/23/2018 1350   PCO2ART 24.3 (L) 01/23/2018 1350   PO2ART 184 (H) 01/23/2018 1350   HCO3 14.7 (L) 01/23/2018 1350   ACIDBASEDEF 8.7 (H) 01/23/2018 1350   O2SAT 99.4 01/23/2018 1350     Coagulation Profile: Recent Labs  Lab 01/23/18 0439  INR 1.39    Cardiac Enzymes: Recent Labs  Lab 01/22/18 1403  TROPONINI <0.03    HbA1C: No results found for: HGBA1C  CBG: No results for input(s): GLUCAP in the last 168 hours.    Erick Colace ACNP-BC [place] Pager # [phone] OR # [phone] if no answer

## 2018-01-24 NOTE — Procedures (Signed)
Extubation Procedure Note  Patient Details:   Name: Debbie Grimes DOB: 1965-05-31 MRN: [id number]   Airway Documentation:  Airway 7.5 mm (Active)  Secured at (cm) 20 cm 01/24/2018  3:44 AM  Measured From Lips 01/24/2018  3:44 AM  [place] 01/24/2018  3:44 AM  Secured By [employer] 01/24/2018  3:44 AM  Tube Holder Repositioned Yes 01/24/2018  3:44 AM  Cuff Pressure (cm H2O) 24 cm H2O 01/23/2018  7:43 PM   Vent end date: 01/24/18 Vent end time: 0808   Evaluation  O2 sats: stable throughout Complications: No apparent complications Patient did tolerate procedure well. Bilateral Breath Sounds: Clear, Rhonchi, Diminished   Yes    Patient extubated to 3 L [place]. RT will continue to monitor patient.   Lamonte Sakai 01/24/2018, 8:16 AM

## 2018-01-24 NOTE — Progress Notes (Signed)
Nutrition Follow-up  DOCUMENTATION CODES:   Severe malnutrition in context of social or environmental circumstances, Underweight  INTERVENTION:  - Will order Boost Breeze BID, each supplement provides 250 kcal and 9 grams of protein - Will order 15 mL liquid multivitamin/day. - Continue diet advancement as medically feasible. - Encourage PO intakes.   Monitor magnesium, potassium, and phosphorus daily for at least 3 days, MD to replete as needed, as pt is at risk for refeeding syndrome given severe malnutrition, poor PO intakes PTA, hypokalemia and hypomagnesemia on admission.   NUTRITION DIAGNOSIS:   Severe Malnutrition related to social / environmental circumstances as evidenced by severe muscle depletion, severe fat depletion. -ongoing  GOAL:   Patient will meet greater than or equal to 90% of their needs -unmet/unable to meet at this time.   MONITOR:   PO intake, Supplement acceptance, Diet advancement, Weight trends, Labs  ASSESSMENT:   51 y.o. female with medical history significant for alcohol use disorder, BPD-1, COPD, cirrhosis/ascites, polysubstance use, protein calorie malnutrition, anemia, and tobacco use. She presented to the ED with generalized weakness. History provided by patient, patient's sister and boyfriend. They report that she has been intermittently weak for over a year but worse over the last 1 week. She also had poor p.o. intake over the last 1 week and reports significant weight loss. Her skin has been yellowish. She fell about 2 days PTA but denies hitting her head or loss of consciousness. Reports smoking about 10 cigarettes a day, smoking marijuana, and drinking daily; she drank about a half bottle of vodka the night PTA. Patient admitted for symptomatic anemia and electrolyte imbalance.  Patient was intubated following RD assessment yesterday AM. Patient was subsequently extubated today around 8:15 AM. Diet advanced from NPO to CLD at 9:03 AM. Will order  items as outlined above.   Per Pete's note this AM: acute blood loss anemia d/t UGIB with severe erosive esophagitis and presumptive portal hypertensive gastropathy with varices, decompensated cirrhosis and not a candidate for TIPS, plan for consult to Palliative Care, AKI improved with IVF, hyperbilirubinemia with jaundice, abdominal ultrasound showed probable gallbladder sludge with small gallstones without biliary dilation, diet advancement to be per GI.     Medications reviewed; 8 mg/hr IV Protonix x72 hours started 10/1 at 1100, 40 mg IV Protonix BID. Labs reviewed; BUN: 25 mg/dL, creatinine: 1.36 mg/dL, Ca: 6.6 mg/dL, GFR: 44 mL/min. IVF; NS-40 mEq KCl @ 10 mL/hr.     Diet Order:   Diet Order            Diet clear liquid Room service appropriate? Yes; Fluid consistency: Thin  Diet effective now              EDUCATION NEEDS:   Not appropriate for education at this time  Skin:  Skin Assessment: Skin Integrity Issues: Skin Integrity Issues:: Stage II Stage II: coccyx  Last BM:  10/1  Height:   Ht Readings from Last 1 Encounters:  01/23/18 5\' 4"  (1.626 m)    Weight:   Wt Readings from Last 1 Encounters:  01/23/18 46.4 kg    Ideal Body Weight:  59.09 kg  BMI:  Body mass index is 17.56 kg/m.  Estimated Nutritional Needs:   Kcal:  1625-1855 (35-40 kcal/kg)  Protein:  70-83 grams (1.5-1.8 grams/kg)  Fluid:  >/= 1.7 L/day     Jarome Matin, MS, RD, LDN, [facility] Inpatient Clinical Dietitian Pager # [phone] After hours/weekend pager # [phone]

## 2018-01-24 NOTE — Consult Note (Signed)
Consultation Note Date: 01/24/2018   Patient Name: Debbie Grimes  DOB: February 20, 1966  MRN: [id number]  Age / Sex: 52 y.o., female  PCP: Patient, No Pcp Per Referring Physician: Garner Nash, DO  Reason for Consultation: Establishing goals of care  HPI/Patient Profile: 52 y.o. female     admitted on 01/22/2018    Clinical Assessment and Goals of Care: 52 yo lady with life limiting illness of acute on chronic decompensated liver disease, due to ETOH use.  She was admitted with a Hgb of 4 gm/dL, GI bleed with symptomatic anemia and decompensated cirrhosis, she received 2 units PRBC.  GI was consulted. She developed hematemesis, EGD showed greater than 75% of esophagus with significant erosive esophagitis.  Presumptive portal hypertension and gastropathy. In this hospitalization, she was briefly intubated for airway protection, she passed spontaneous breathing trial and has since been extubated. It is noted that her long-term prognosis is still poor due to her severity of underlying cirrhosis and portal hypertension and severe protein calorie malnutrition.   A palliative consult has been requested for goals of care discussions.   The patient appears frail weak icteric. She has muscle wasting. Her daughter, friend and also her ex sister in law are all at the bedside.   I introduced palliative care as follows: Palliative medicine is specialized medical care for people living with serious illness. It focuses on providing relief from the symptoms and stress of a serious illness. The goal is to improve quality of life for both the patient and the family.  We discussed frankly and compassionately about the patient's current condition, her underlying illness and this hospitalization. Goals, wishes and values explored. Provided patient with active listening, support and presence.   Please note additional discussions  and recommendations as listed below. Thank you for the consult.   NEXT OF KIN  has 2 daughters She lives with significant other.    SUMMARY OF RECOMMENDATIONS   Agree with DNR Cautiously monitor hospital trajectory of her disease, patient understands the severe incurable irreversible nature of her illness.  I discussed with her and her family in detail about hospice philosophy of care.  Agree that the patient is hospice eligible, consider residential hospice on discharge if ongoing decline, should the patient have some what of a rally, would consider SNF rehab attempt with palliative. Discussed with patient and her daughter. Continue to monitor.  Thank you for the consult.  Code Status/Advance Care Planning:  DNR    Symptom Management:    as above   Palliative Prophylaxis:   Aspiration     Psycho-social/Spiritual:   Desire for further Chaplaincy support:yes  Additional Recommendations: Education on Hospice  Prognosis:   Guarded   Discharge Planning: To Be Determined      Primary Diagnoses: Present on Admission: . Symptomatic anemia . ETOH abuse . Tobacco use disorder . Protein-calorie malnutrition, severe . Hyponatremia . Hypokalemia . Hyperbilirubinemia . AKI (acute kidney injury) ([place]) . Metabolic acidosis with increased anion gap and accumulation of organic acids  I have reviewed the medical record, interviewed the patient and family, and examined the patient. The following aspects are pertinent.  Past Medical History:  Diagnosis Date  . Alcoholism ([place]) 2000   chronic  . Bipolar 1 disorder ([place]) 2010   with depression and anxiety  . COPD (chronic obstructive pulmonary disease) ([place])   . Falls frequently   . Hepatic steatosis 09/9676   alcoholic fatty liver.  Metavir fibrosis score F 2 with some F3  . Hepatitis C   . Hepatitis C antibody test positive 09/2015   HCV quant: no virus detected.   . Polysubstance abuse ([place]) 2000  . Protein-calorie  malnutrition ([place]) 2012  . Symptomatic anemia 10/09/2017   Social History   Socioeconomic History  . Marital status: Single    Spouse name: Not on file  . Number of children: Not on file  . Years of education: Not on file  . Highest education level: Not on file  Occupational History  . Not on file  Social Needs  . Financial resource strain: Not on file  . Food insecurity:    Worry: Not on file    Inability: Not on file  . Transportation needs:    Medical: Not on file    Non-medical: Not on file  Tobacco Use  . Smoking status: Current Every Day Smoker    Packs/day: 1.00    Years: 39.00    Pack years: 39.00    Types: Cigarettes  . Smokeless tobacco: Never Used  . Tobacco comment: 2-3 packs/day since age 24  Substance and Sexual Activity  . Alcohol use: Not Currently    Alcohol/week: 14.0 standard drinks    Types: 14 Cans of beer per week    Comment: 09/08/2017 "22oz beer qd"  . Drug use: Never  . Sexual activity: Not Currently    Birth control/protection: None  Lifestyle  . Physical activity:    Days per week: Not on file    Minutes per session: Not on file  . Stress: Not on file  Relationships  . Social connections:    Talks on phone: Not on file    Gets together: Not on file    Attends religious service: Not on file    Active member of club or organization: Not on file    Attends meetings of clubs or organizations: Not on file    Relationship status: Not on file  Other Topics Concern  . Not on file  Social History Narrative   Used to work in [employer] though unemployed since age 26 due to feet pain   Spends most of her days on a recliner watching TV and drinking beverages   Lives with a female partner       History reviewed. No pertinent family history. Scheduled Meds: . chlorhexidine gluconate (MEDLINE KIT)  15 mL Mouth Rinse BID  . feeding supplement  1 Container Oral BID BM  . mouth rinse  15 mL Mouth Rinse 10 times per day  . multivitamin  15 mL Oral  Daily  . [START ON 01/26/2018] pantoprazole  40 mg Intravenous Q12H  . prednisoLONE acetate  1 drop Left Eye Daily  . rifaximin  550 mg Oral BID  . trimethoprim-polymyxin b  1 drop Both Eyes Q6H   Continuous Infusions: . 0.9 % NaCl with KCl 40 mEq / L 10 mL/hr (01/24/18 0810)  . cefTRIAXone (ROCEPHIN)  IV Stopped (01/23/18 2032)  . dexmedetomidine (PRECEDEX) IV infusion 0.4 mcg/kg/hr (01/23/18 1225)  .  octreotide  (SANDOSTATIN)    IV infusion 50 mcg/hr (01/24/18 0800)  . pantoprozole (PROTONIX) infusion 8 mg/hr (01/24/18 0802)   PRN Meds:.ondansetron **OR** ondansetron (ZOFRAN) IV, polyethylene glycol Medications Prior to Admission:  Prior to Admission medications   Medication Sig Start Date End Date Taking? Authorizing Provider  ibuprofen (ADVIL,MOTRIN) 200 MG tablet Take 200 mg by mouth daily as needed (pain).   Yes [provider]  pantoprazole (PROTONIX) 40 MG tablet Take 1 tablet (40 mg total) by mouth 2 (two) times daily. 12/22/17  Yes Levin Erp, PA  Potassium Chloride ER 20 MEQ TBCR Take 20 mEq by mouth daily for 2 days. 12/26/17 01/22/18 Yes Lemmon, Lavone Nian, PA  prednisoLONE acetate (PRED FORTE) 1 % ophthalmic suspension Place 1 drop into the left eye daily. 01/04/18  Yes [provider]  spironolactone (ALDACTONE) 25 MG tablet Take 1 tablet (25 mg total) by mouth daily. 08/08/36  Yes Delora Fuel, MD  Tiotropium Bromide-Olodaterol (STIOLTO RESPIMAT) 2.5-2.5 MCG/ACT AERS Inhale 1 Inhaler into the lungs daily as needed (SOB).    Yes [provider]  trimethoprim-polymyxin b (POLYTRIM) ophthalmic solution Place 1 drop into both eyes every 6 (six) hours. 01/05/18  Yes [provider]  AMBULATORY NON FORMULARY MEDICATION Ensure - drink Ensure three times daily. Patient not taking: Reported on 01/22/2018 12/22/17   Levin Erp, PA  folic acid (FOLVITE) 1 MG tablet TAKE 1 TABLET BY MOUTH EVERY DAY Patient not taking: Reported on  01/22/2018 10/16/17   Guadalupe Dawn, MD   No Known Allergies Review of Systems Complains of generalized weakness  Physical Exam   frail 52 year old female    she is in no acute distress    shallow clear breath sounds On O2  Cardiac: Regular rate and rhythm Abdomen: Soft nontender no organomegaly, abdomen is distended Extremities: Jaundice, no edema, brisk cap refill, warm. Neuro: Awake alert oriented      Vital Signs: BP 122/68   Pulse 85   Temp 98 F (36.7 C) (Axillary)   Resp 15   Ht 5' 4"  (1.626 m)   Wt 46.4 kg   SpO2 100%   BMI 17.56 kg/m  Pain Scale: 0-10 POSS *See Group Information*: S-Acceptable,Sleep, easy to arouse Pain Score: 0-No pain   SpO2: SpO2: 100 % O2 Device:SpO2: 100 % O2 Flow Rate: .   IO: Intake/output summary:   Intake/Output Summary (Last 24 hours) at 01/24/2018 1540 Last data filed at 01/24/2018 1400 Gross per 24 hour  Intake 1266.7 ml  Output 165 ml  Net 1101.7 ml    LBM: Last BM Date: 01/23/18 Baseline Weight: Weight: 44.5 kg Most recent weight: Weight: 46.4 kg     Palliative Assessment/Data:  PPS 30%   Time In:  1300 Time Out:  1410 Time Total:  70 min  Greater than 50%  of this time was spent counseling and coordinating care related to the above assessment and plan.  Signed by: Loistine Chance, MD  [phone]  Please contact Palliative Medicine Team phone at [phone] for questions and concerns.  For individual provider: See Shea Evans

## 2018-01-24 NOTE — Progress Notes (Addendum)
[facility] Progress Note   Chief Complaint: anemia, GI bleed      SUBJECTIVE:   No specific complaints today.   ASSESSMENT AND PLAN:   1. 52 yo female with decompensated ETOH cirrhosis, high MELD.  DF < 32.  Presented with severe anemia, developed hematemesis this admission.  Severe esophagitis on EGD yesterday, underlying varices could not be excluded though no varices were seen on EGD in June of this year. -Continue octreotide through today -Continue with IV PPI -Hemoglobin up from 4.7-9.9 after total of 3 units of blood -no evidence for SBP on diagnostic tap yesterday.  Given GI bleed with continue empiric Rocephin -Continue Xifaxan.  -Diuretics on hold given AKI which is slowly improving -Clear liquids ordered -HCC: No focal liver lesions on CT scan mid July, AFP this admission is normal.   Barrett's esophagus, due for surveillance EGD 2021.  Severe esophagitis on EGD yesterday. -Continue PPI -Continue antireflux measures   OBJECTIVE:    EGD 10/1/1 Severe, LA Grade D erosive esophagitis, can't exclude underlying varices.  Portal hypertensive gastropathy.    Vital signs in last 24 hours: Temp:  [97.4 F (36.3 C)-97.6 F (36.4 C)] 97.6 F (36.4 C) (10/02 0805) Pulse Rate:  [65-121] 86 (10/02 0600) Resp:  [9-24] 18 (10/02 0600) BP: (69-146)/(40-112) 96/44 (10/02 0600) SpO2:  [95 %-100 %] 100 % (10/02 0600) FiO2 (%):  [30 %-50 %] 30 % (10/02 0344) Last BM Date: 01/23/18 General:   Alert, tonically ill-appearing frail female in NAD.  Family in room EENT:  Normal hearing, non icteric sclera, conjunctive pink.  Heart:  Regular rate and rhythm, no lower extremity edema Pulm: Normal respiratory effort, lungs CTA bilaterally without wheezes or crackles. Abdomen:  Soft, moderately distended, nontender.  Normal bowel sounds, no masses felt.     Neurologic:  Alert and  oriented x4;  grossly normal neurologically. No asterixis Psych:  Pleasant,  cooperative.  Normal mood and affect.   Intake/Output from previous day: 10/01 0701 - 10/02 0700 In: 1325.4 [I.V.:1223.1; IV Piggyback:102.3] Out: 100 [Urine:100] Intake/Output this shift: No intake/output data recorded.  Lab Results: Recent Labs    01/22/18 1403 01/23/18 0439 01/23/18 1857 01/24/18 0806  WBC 9.5 7.8  --  6.2  HGB 4.7* 8.0* 10.3* 9.9*  HCT 13.3* 22.3* 29.2* 27.7*  PLT 164 106*  --  79*   BMET Recent Labs    01/22/18 1403 01/23/18 0439 01/24/18 0806  NA 127* 132* 138  K 2.9* 3.3* 3.9  CL 93* 103 109  CO2 20* 16* 16*  GLUCOSE 87 103* 93  BUN 27* 26* 25*  CREATININE 1.86* 1.39* 1.36*  CALCIUM 7.6* 6.7* 6.6*   LFT Recent Labs    01/22/18 1647  01/24/18 0806  PROT  --    < > 5.0*  ALBUMIN  --    < > 1.7*  AST  --    < > 53*  ALT  --    < > 21  ALKPHOS  --    < > 72  BILITOT 12.1*   < > 12.9*  BILIDIR 6.8*  --   --   IBILI 5.3*  --   --    < > = values in this interval not displayed.   PT/INR Recent Labs    01/23/18 0439  LABPROT 17.0*  INR 1.39   Hepatitis Panel No results for input(s): HEPBSAG, HCVAB, HEPAIGM, HEPBIGM in the last 72 hours.  Dg Abd 1 View  Result Date: 01/23/2018 CLINICAL DATA:  Hematemesis, anemia EXAM: ABDOMEN - 1 VIEW COMPARISON:  CT abdomen/pelvis dated 11/09/2017 FINDINGS: Nonobstructive bowel gas pattern. Mild degenerative changes of the lower thoracic spine. IMPRESSION: Unremarkable abdominal radiograph. Electronically Signed   By: Julian Hy M.D.   On: 01/23/2018 01:09   US Abdomen Complete  Result Date: 01/22/2018 CLINICAL DATA:  Bilirubinemia. Alcoholism, polysubstance abuse. Hepatitis-C EXAM: ABDOMEN ULTRASOUND COMPLETE COMPARISON:  CT abdomen pelvis 11/09/2017 FINDINGS: Gallbladder: Gallbladder sludge with small gallstones, 4 mm. Gallbladder wall mildly thickened 3.1 mm likely due to ascites. Negative sonographic Murphy sign. Common bile duct: Diameter: 4 mm Liver: Diffusely hyperechoic liver  parenchyma without focal lesion. Portal vein is patent on color Doppler imaging with normal direction of blood flow towards the liver. IVC: No abnormality visualized. Pancreas: Visualized portion unremarkable. Spleen: Size and appearance within normal limits. Right Kidney: Length: 10.6 cm. Echogenicity within normal limits. No mass or hydronephrosis visualized. Left Kidney: Length: 10.4 cm. Echogenicity within normal limits. No mass or hydronephrosis visualized. Abdominal aorta: No aneurysm visualized. Other findings: Moderate ascites. IMPRESSION: Gallbladder sludge and small gallstones.  No biliary dilatation. Moderate ascites Electronically Signed   By: Franchot Gallo M.D.   On: 01/22/2018 18:30   US Paracentesis  Result Date: 01/23/2018 INDICATION: Patient with alcoholic cirrhosis, ascites. Request is made for diagnostic paracentesis with 500 mL maximum to be withdrawn from abdomen today. EXAM: ULTRASOUND GUIDED DIAGNOSTIC PARACENTESIS MEDICATIONS: 10 mL 1% lidocaine COMPLICATIONS: None immediate. PROCEDURE: Informed written consent was obtained from the patient after a discussion of the risks, benefits and alternatives to treatment. A timeout was performed prior to the initiation of the procedure. Initial ultrasound scanning demonstrates a moderate amount of ascites within the right lower abdominal quadrant. The right lower abdomen was prepped and draped in the usual sterile fashion. 1% lidocaine was used for local anesthesia. Following this, a 19 gauge, 7-cm, Yueh catheter was introduced. An ultrasound image was saved for documentation purposes. The paracentesis was performed. The catheter was removed and a dressing was applied. The patient tolerated the procedure well without immediate post procedural complication. FINDINGS: A total of approximately 500 mL of yellow fluid was removed. Samples were sent to the laboratory as requested by the clinical team. IMPRESSION: Successful ultrasound-guided  paracentesis yielding 500 mL of peritoneal fluid. Read by: Brynda Greathouse PA-C Electronically Signed   By: Marybelle Killings M.D.   On: 01/23/2018 13:39   Dg Chest Port 1 View  Result Date: 01/23/2018 CLINICAL DATA:  Endotracheal tube placement. EXAM: PORTABLE CHEST 1 VIEW COMPARISON:  10/09/2017. FINDINGS: Endotracheal tube noted with tip 4.0 cm above the carina. Heart size normal. Low lung volumes with mild bibasilar atelectasis/infiltrates. No pleural effusion or pneumothorax. IMPRESSION: 1.  Endotracheal tube noted with tip 4.0 cm above the carina. 2.  Low lung volumes with mild bibasilar atelectasis/infiltrates. Electronically Signed   By: Marcello Moores  Register   On: 01/23/2018 12:49   Active Problems:   ETOH abuse   Tobacco use disorder   Protein-calorie malnutrition, severe   Symptomatic anemia   Acute GI bleeding   Hyponatremia   Hypokalemia   Hyperbilirubinemia   AKI (acute kidney injury) ([place])   Metabolic acidosis with increased anion gap and accumulation of organic acids   Pressure injury of skin     LOS: 2 days   Tye Savoy ,NP 01/24/2018, 9:03 AM   I have discussed the case with the PA (with whom I saw the patient), and that is the plan I  formulated. I personally interviewed and examined the patient.  She is stable overall from her multisystem organ disease.  There does not appear to be any ongoing GI bleeding.  Upper endoscopy findings yesterday were reviewed with patient and family.  We will continue a total 48 hours of IV Protonix and octreotide due to upper GI bleed in the setting of underlying cirrhosis.  There was some concern on the part of Dr. Silverio Decamp that there may have been esophageal varices that were decompressed by the bleeding.  She has a moderate amount of ascites, it is certainly not tense and she has no respiratory compromise.  I would not perform any large volume paracentesis at this time because it would risk renal decompensation.  Our plan right now is close  observation, medicines as described above, begin a clear liquid diet, serial hemoglobin and hematocrit checks, and eventual plans for outpatient follow-up.  Her long-term prognosis is still poor due to her severity of underlying cirrhosis and portal hypertension and severe protein calorie malnutrition.  Strict alcohol abstinence is essential going forward.  Total time 35 minutes, over half spent in direct face-to-face conversation with patient and multiple family members, updating on this complex condition and answering questions.   Debbie Grimes Office: [phone]

## 2018-01-24 NOTE — Care Management Note (Signed)
Case Management Note  Patient Details  Name: Debbie Grimes MRN: [id number] Date of Birth: 10-14-1965  Subjective/Objective:                  Gi bleed hgb on admission 4.7,m jnow 8.0 s/p 1 unit of prbc/ extubateed to [place] 02 this am, iv rocephine, iv sandostatin, iv protonix  Action/Plan: Will follow for progression of care and clinical status. Will follow for case management needs none present at this time.  Expected Discharge Date:  (unknown)               Expected Discharge Plan:  Home/Self Care  In-House Referral:     Discharge planning Services  CM Consult  Post Acute Care Choice:    Choice offered to:     DME Arranged:    DME Agency:     HH Arranged:    HH Agency:     Status of Service:  In process, will continue to follow  If discussed at Long Length of Stay Meetings, dates discussed:    Additional Comments:  Leeroy Cha, RN 01/24/2018, 8:35 AM

## 2018-01-25 ENCOUNTER — Encounter (HOSPITAL_COMMUNITY): Payer: Self-pay | Admitting: Gastroenterology

## 2018-01-25 ENCOUNTER — Inpatient Hospital Stay (HOSPITAL_COMMUNITY): Payer: Medicaid Other

## 2018-01-25 DIAGNOSIS — I4729 Other ventricular tachycardia: Secondary | ICD-10-CM

## 2018-01-25 DIAGNOSIS — I472 Ventricular tachycardia: Secondary | ICD-10-CM

## 2018-01-25 DIAGNOSIS — K3189 Other diseases of stomach and duodenum: Secondary | ICD-10-CM

## 2018-01-25 DIAGNOSIS — K766 Portal hypertension: Secondary | ICD-10-CM

## 2018-01-25 DIAGNOSIS — K7290 Hepatic failure, unspecified without coma: Secondary | ICD-10-CM

## 2018-01-25 DIAGNOSIS — F17200 Nicotine dependence, unspecified, uncomplicated: Secondary | ICD-10-CM

## 2018-01-25 DIAGNOSIS — K7031 Alcoholic cirrhosis of liver with ascites: Secondary | ICD-10-CM | POA: Diagnosis present

## 2018-01-25 DIAGNOSIS — K2210 Ulcer of esophagus without bleeding: Secondary | ICD-10-CM | POA: Diagnosis present

## 2018-01-25 LAB — HEMOGLOBIN AND HEMATOCRIT, BLOOD
HCT: 27.5 % — ABNORMAL LOW (ref 36.0–46.0)
HCT: 27.6 % — ABNORMAL LOW (ref 36.0–46.0)
HEMOGLOBIN: 9.9 g/dL — AB (ref 12.0–15.0)
Hemoglobin: 9.8 g/dL — ABNORMAL LOW (ref 12.0–15.0)

## 2018-01-25 MED ORDER — SODIUM CHLORIDE 0.9 % IV BOLUS
500.0000 mL | Freq: Once | INTRAVENOUS | Status: AC
  Administered 2018-01-25: 500 mL via INTRAVENOUS

## 2018-01-25 NOTE — Progress Notes (Addendum)
Pt w/ SBP in 80s. MAPS > 55. All other VSS. Mentation wdl. MD made aware of BP. RN instructed to continue to monitor and advised that SBP in 80s and 90s was okay unless a change in mental status is noted.

## 2018-01-25 NOTE — Progress Notes (Signed)
Called e-link about low BP (88/56) Map 65, to see if a bolus was needed. Pt. is resting well. Will continue to monitor.

## 2018-01-25 NOTE — Evaluation (Signed)
Physical Therapy Evaluation Patient Details Name: Debbie Grimes MRN: [id number] DOB: 1966-03-24 Today's Date: 01/25/2018   History of Present Illness  52 yo lady with life limiting illness of acute on chronic decompensated liver disease, due to ETOH use. She was admitted with a Hgb of 4 gm/dL, GI bleed with symptomatic anemia and decompensated cirrhosis, she received 2 units PRBC.  GI was consulted. She developed hematemesis, EGD showed greater than 75% of esophagus with significant erosive esophagitis.  Presumptive portal hypertension and gastropathy.  briefly intubated 10/1--10/2;                   Clinical Impression  Past Medical History:  Diagnosis Date  . Alcoholism ([place]) 2000   chronic  . Bipolar 1 disorder ([place]) 2010   with depression and anxiety  . COPD (chronic obstructive pulmonary disease) ([place])   . Falls frequently   . Hepatic steatosis 09/7589   alcoholic fatty liver.  Metavir fibrosis score F 2 with some F3  . Hepatitis C   . Hepatitis C antibody test positive 09/2015   HCV quant: no virus detected.   . Polysubstance abuse ([place]) 2000  . Protein-calorie malnutrition ([place]) 2012  . Symptomatic anemia 10/09/2017    Pt admitted with above diagnosis. Pt currently with functional limitations due to the deficits listed below (see PT Problem List).   Pt is extremely dyspneic with decr BP and  elevated RR at time of PT/OT eval, activity tolerance is quite low with rest period needed after  minimal effort/bed mobility;   VS as follows:  RR 23-35, SpO2 = 97-99% on $L [place], HR 100s, BP in supine 85/54, BP in sitting 95/55 (denies dizziness)  will continue to follow in acute setting however feel pt has extremely limited rehab potential given her serious, chronic medical issues;  Pt will benefit from skilled PT to increase their independence and safety with mobility to allow discharge to the venue listed below.       Follow Up Recommendations SNF(vs residential  hospice or SNF with Palliative no rehab)    Equipment Recommendations  None recommended by PT    Recommendations for Other Services       Precautions / Restrictions Precautions Precautions: Fall Restrictions Weight Bearing Restrictions: No      Mobility  Bed Mobility Overal bed mobility: Needs Assistance Bed Mobility: Supine to Sit     Supine to sit: Min assist;+2 for safety/equipment;+2 for physical assistance     General bed mobility comments: incr time, assist with LEs, trunk, bed pad utilized to scoot to EOB; 4/4 DOE on [place] O2  Transfers Overall transfer level: Needs assistance Equipment used: Rolling walker (2 wheeled) Transfers: Sit to/from [facility] Sit to Stand: [employer] safety/equipment Stand pivot transfers: Min assist;+2 safety/equipment       General transfer comment: light assist to rise and stabilize, activity limited by dyspnea  Ambulation/Gait                Stairs            Wheelchair Mobility    Modified Rankin (Stroke Patients Only)       Balance Overall balance assessment: Needs assistance Sitting-balance support: Feet supported;Single extremity supported Sitting balance-Leahy Scale: Fair       Standing balance-Leahy Scale: Poor Standing balance comment: reliant on UEs  Pertinent Vitals/Pain Pain Assessment: No/denies pain    Home Living Family/patient expects to be discharged to:: Unsure Living Arrangements: Spouse/significant other(lives with boyfriend) Available Help at Discharge: Family Type of Home: Mobile home Home Access: Stairs to enter Entrance Stairs-Rails: None Entrance Stairs-Number of Steps: 2 Home Layout: One level Home Equipment: Cane - single point;Walker - 2 wheels Additional Comments: info above taken from previous chart review from 4 mos ago    Prior Function Level of Independence: Needs assistance   Gait / Transfers Assistance  Needed: use of RW for  ambulation; history of falls           Hand Dominance        Extremity/Trunk Assessment   Upper Extremity Assessment Upper Extremity Assessment: Defer to OT evaluation    Lower Extremity Assessment Lower Extremity Assessment: Generalized weakness(significant generalized muscle atrophy)       Communication   Communication: No difficulties  Cognition Arousal/Alertness: Awake/alert Behavior During Therapy: Flat affect Overall Cognitive Status: Within Functional Limits for tasks assessed                                 General Comments: verbalizes very little d/t DOE      General Comments      Exercises     Assessment/Plan    PT Assessment Patient needs continued PT services  PT Problem List Decreased strength;Decreased activity tolerance;Cardiopulmonary status limiting activity;Decreased mobility       PT Treatment Interventions Therapeutic activities;Therapeutic exercise;Patient/family education;Gait training;Functional mobility training    PT Goals (Current goals can be found in the Care Plan section)  Acute Rehab PT Goals PT Goal Formulation: With patient Time For Goal Achievement: 02/08/18 Potential to Achieve Goals: Poor    Frequency Min 2X/week   Barriers to discharge        Co-evaluation PT/OT/SLP Co-Evaluation/Treatment: Yes Reason for Co-Treatment: Complexity of the patient's impairments (multi-system involvement) PT goals addressed during session: Mobility/safety with mobility         AM-PAC PT "6 Clicks" Daily Activity  Outcome Measure Difficulty turning over in bed (including adjusting bedclothes, sheets and blankets)?: A Lot Difficulty moving from lying on back to sitting on the side of the bed? : Unable Difficulty sitting down on and standing up from a chair with arms (e.g., wheelchair, bedside commode, etc,.)?: Unable Help needed moving to and from a bed to chair (including a wheelchair)?: A  Little Help needed walking in hospital room?: A Lot Help needed climbing 3-5 steps with a railing? : Total 6 Click Score: 10    End of Session   Activity Tolerance: Patient limited by fatigue;Treatment limited secondary to medical complications (Comment)(cardiopulmonary status) Patient left: in chair;with call bell/phone within reach;with family/visitor present   PT Visit Diagnosis: Muscle weakness (generalized) (M62.81)    Time: 2542-7062 PT Time Calculation (min) (ACUTE ONLY): 21 min   Charges:   PT Evaluation $PT Eval Moderate Complexity: 1 Mod          Kenyon Ana, PT Pager: [phone] 01/25/2018   [facility] [phone]   [facility] 01/25/2018, 9:47 AM

## 2018-01-25 NOTE — Evaluation (Addendum)
Occupational Therapy Evaluation Patient Details Name: Debbie Grimes MRN: [id number] DOB: 29-Oct-1965 Today's Date: 01/25/2018    History of Present Illness 52 yo lady with life limiting illness of acute on chronic decompensated liver disease, due to ETOH use. She was admitted with a Hgb of 4 gm/dL, GI bleed with symptomatic anemia and decompensated cirrhosis, she received 2 units PRBC.  GI was consulted. She developed hematemesis, EGD showed greater than 75% of esophagus with significant erosive esophagitis.  Presumptive portal hypertension and gastropathy.  briefly intubated 10/1--10/2;    Clinical Impression   Pt seen for initial evaluation. She was tired of being in bed and happy to get up into chair.  She was not able to tolerate ADL activities at this time due to dyspnea.  Pt wants to walk. Will follow in acute setting to increase activity tolerance as she is able and as she chooses, to enhance quality of life.      Follow Up Recommendations  Supervision/Assistance - 24 hour;SNF (per notes, possible hospice services)   Equipment Recommendations  3 in 1 bedside commode    Recommendations for Other Services       Precautions / Restrictions Precautions Precautions: Fall Restrictions Weight Bearing Restrictions: No      Mobility Bed Mobility Overal bed mobility: Needs Assistance Bed Mobility: Supine to Sit     Supine to sit: Min assist;+2 for safety/equipment;+2 for physical assistance     General bed mobility comments: incr time, assist with LEs, trunk, bed pad utilized to scoot to EOB; 4/4 DOE on [place] O2  Transfers Overall transfer level: Needs assistance Equipment used: Rolling walker (2 wheeled) Transfers: Sit to/from [facility] Sit to Stand: [employer] safety/equipment Stand pivot transfers: Min assist;+2 safety/equipment       General transfer comment: light assist to rise and stabilize, activity limited by dyspnea    Balance Overall  balance assessment: Needs assistance Sitting-balance support: Feet supported;Single extremity supported Sitting balance-Leahy Scale: Fair       Standing balance-Leahy Scale: Poor Standing balance comment: reliant on UEs                           ADL either performed or assessed with clinical judgement   ADL Overall ADL's : Needs assistance/impaired Eating/Feeding: Set up   Grooming: Set up                   Toilet Transfer: Minimal assistance;Stand-pivot(hand held assistance to chair)             General ADL Comments: pt does not have endurance for ADLs at this time.  dyspnea with any activity.  Took breaks between sips.  Based on clinical judgment, she needs max A for UB and total A for LB adls     Vision         Perception     Praxis      Pertinent Vitals/Pain Pain Assessment: No/denies pain     Hand Dominance     Extremity/Trunk Assessment Upper Extremity Assessment Upper Extremity Assessment: Generalized weakness   Lower Extremity Assessment Lower Extremity Assessment: Generalized weakness(significant generalized muscle atrophy)       Communication Communication Communication: No difficulties   Cognition Arousal/Alertness: Awake/alert Behavior During Therapy: Flat affect Overall Cognitive Status: Within Functional Limits for tasks assessed  General Comments: verbalizes very little d/t DOE   General Comments   RR 25-36; sats 97-99% on 4 liters; HR in low 100s. BPs soft but no dizziness--raised slightly when sitting up     Exercises     Shoulder Instructions      Home Living Family/patient expects to be discharged to:: Unsure Living Arrangements: Spouse/significant other(lives with boyfriend) Available Help at Discharge: Family Type of Home: Mobile home Home Access: Stairs to enter Entrance Stairs-Number of Steps: 2 Entrance Stairs-Rails: None Home Layout: One level      Bathroom Shower/Tub: Occupational psychologist: Standard     Home Equipment: Cane - single point;Walker - 2 wheels   Additional Comments: info above taken from previous chart review from 4 mos ago      Prior Functioning/Environment Level of Independence: Needs assistance  Gait / Transfers Assistance Needed: use of RW for  ambulation; history of falls     Comments: family member present in room and states that pt used RW and performed own self care prior to admission        OT Problem List: Decreased strength;Decreased activity tolerance;Impaired balance (sitting and/or standing);Cardiopulmonary status limiting activity;Decreased knowledge of use of DME or AE      OT Treatment/Interventions: Self-care/ADL training;Therapeutic exercise;DME and/or AE instruction;Balance training;Patient/family education;Therapeutic activities    OT Goals(Current goals can be found in the care plan section) Acute Rehab OT Goals Patient Stated Goal: to walk OT Goal Formulation: With patient Time For Goal Achievement: 02/08/18 Potential to Achieve Goals: Fair ADL Goals Pt Will Transfer to Toilet: with min guard assist;stand pivot transfer;bedside commode Additional ADL Goal #1: Pt will stand for 2 minutes for adls with min guard assistance  OT Frequency: Min 2X/week   Barriers to D/C:            Co-evaluation PT/OT/SLP Co-Evaluation/Treatment: Yes Reason for Co-Treatment: Complexity of the patient's impairments (multi-system involvement) PT goals addressed during session: Mobility/safety with mobility OT goals addressed during session: Strengthening/ROM      AM-PAC PT "6 Clicks" Daily Activity     Outcome Measure Help from another person eating meals?: A Little Help from another person taking care of personal grooming?: A Little Help from another person toileting, which includes using toliet, bedpan, or urinal?: A Lot Help from another person bathing (including washing, rinsing,  drying)?: A Lot Help from another person to put on and taking off regular upper body clothing?: A Lot Help from another person to put on and taking off regular lower body clothing?: Total 6 Click Score: 13   End of Session Nurse Communication: Mobility status  Activity Tolerance: Patient limited by fatigue Patient left: in chair;with call bell/phone within reach;with chair alarm set;with nursing/sitter in room  OT Visit Diagnosis: Unsteadiness on feet (R26.81);Muscle weakness (generalized) (M62.81)                Time: 3704-8889 OT Time Calculation (min): 26 min Charges:  OT General Charges $OT Visit: 1 Visit OT Evaluation $OT Eval Moderate Complexity: [place], OTR/L Acute Rehabilitation Services [phone] [facility] pager [phone] office 01/25/2018  [place] 01/25/2018, 11:16 AM

## 2018-01-25 NOTE — Progress Notes (Addendum)
[facility] Progress Note   Chief Complaint:  Anemia, GI bleed    SUBJECTIVE:    no specific complaints. She is lethargic, weak. Has been dyspneic.    ASSESSMENT AND PLAN:   1. 52 yo female with decompensated ETOH cirrhosis, high MELD.  DF < 32.  Presented with severe anemia, developed hematemesis this admission.  Severe esophagitis on EGD, underlying varices could not be excluded though no varices were seen on EGD in June of this year. DNR now. No taking much PO.  -Will d/c octreotide  -Continue with IV PPI per GI bleed protocol -Hemoglobin stable at 9.8 after total of 3 units of blood  -no evidence for SBP on diagnostic tap yesterday.  Given GI bleed with continue empiric Rocephin for total of 5 days ( last dose on Saturday) -Continue Xifaxan.  -Diuretics on hold given AKI. Check labs tomorrow -Clear liquids ordered -HCC:No focal liver lesions on CT scan mid July, AFP this admission is normal.   2. Dyspnea, mainly last night. Family concerned.  -Will check CXR. If negative then abdominal distention from ascites may be culprit.  -I explained to family that a paracentesis could be done for palliation but it would possibly cause worsening AKI.   3. Barrett's esophagus, due for surveillance EGD 2021.  Severe esophagitis on EGD yesterday. -Continue PPI -Continue antireflux measures. Advised to keep HOB up 45 degrees  4. DNR status.   OBJECTIVE:     Vital signs in last 24 hours: Temp:  [97 F (36.1 C)-98.7 F (37.1 C)] 97.1 F (36.2 C) (10/03 0752) Pulse Rate:  [81-122] 103 (10/03 0800) Resp:  [13-36] 36 (10/03 0800) BP: (87-137)/(51-79) 88/63 (10/03 0800) SpO2:  [90 %-100 %] 98 % (10/03 0800) Weight:  [53.2 kg] 53.2 kg (10/03 0216) Last BM Date: 01/23/18 General:   Lethargic. Alert, chronically ill appearing, frail female in NAD EENT:  Normal hearing,  icteric sclera  Heart:  Slight tachy, regular rhythm, No lower extremity edema Pulm: Normal  respiratory effort, lungs CTA bilaterally without wheezes or crackles. Abdomen:  Soft, nondistended, nontender.  Normal bowel sounds, no masses felt.     Neurologic: lethargic. Psych:  Pleasant, cooperative.  Normal mood and affect.   Intake/Output from previous day: 10/02 0701 - 10/03 0700 In: 439.9 [I.V.:339.9; IV Piggyback:100] Out: 210 [Urine:210] Intake/Output this shift: No intake/output data recorded.  Lab Results: Recent Labs    01/22/18 1403 01/23/18 0439  01/24/18 0806 01/24/18 1900 01/25/18 0729  WBC 9.5 7.8  --  6.2  --   --   HGB 4.7* 8.0*   < > 9.9* 8.9* 9.8*  HCT 13.3* 22.3*   < > 27.7* 25.2* 27.5*  PLT 164 106*  --  79*  --   --    < > = values in this interval not displayed.   BMET Recent Labs    01/22/18 1403 01/23/18 0439 01/24/18 0806  NA 127* 132* 138  K 2.9* 3.3* 3.9  CL 93* 103 109  CO2 20* 16* 16*  GLUCOSE 87 103* 93  BUN 27* 26* 25*  CREATININE 1.86* 1.39* 1.36*  CALCIUM 7.6* 6.7* 6.6*   LFT Recent Labs    01/22/18 1647  01/24/18 0806  PROT  --    < > 5.0*  ALBUMIN  --    < > 1.7*  AST  --    < > 53*  ALT  --    < > 21  ALKPHOS  --    < >  72  BILITOT 12.1*   < > 12.9*  BILIDIR 6.8*  --   --   IBILI 5.3*  --   --    < > = values in this interval not displayed.   PT/INR Recent Labs    01/23/18 0439  LABPROT 17.0*  INR 1.39     Principal Problem:   Anemia due to GI blood loss Active Problems:   ETOH abuse   Tobacco use disorder   Protein-calorie malnutrition, severe   Gastrointestinal hemorrhage with hematemesis   Hyperbilirubinemia   AKI (acute kidney injury) ([place])   Metabolic acidosis with increased anion gap and accumulation of organic acids   Pressure injury of skin   Encounter for palliative care   Goals of care, counseling/discussion   Erosive esophagitis   Portal hypertensive gastropathy ([place])   Alcoholic cirrhosis of liver with ascites ([place])     LOS: 3 days   Tye Savoy ,NP 01/25/2018, 10:24  AM    Attending physician's note   I have taken an interval history, reviewed the chart and examined the patient. I agree with the Advanced Practitioner's note, impression and recommendations.  Severe protein energy malnutrition, decompensated alcoholic cirrhosis with jaundice and ascites.  Meld sodium 29 based on labs this admission. Discussed with patient and family prognosis and irreversible nature of advanced decompensated liver disease Reviewed palliative care consult note.  DNR with no escalation of care EGD showed severe erosive esophagitis but cannot exclude underlying esophageal varices. Hemoglobin stable at 9.8.  Avoid transfusion unless hemoglobin is less than 7.  Dyspneic and hypotensive May consider paracentesis for palliation of symptoms, do not recommend removal of greater than 1 L of ascites fluid given hypotension and acute kidney injury Continue rifaximin PPI twice daily and antireflux measures   K. Denzil Magnuson , MD [phone]

## 2018-01-25 NOTE — Progress Notes (Signed)
[facility] Progress Note Patient Name: Debbie Grimes DOB: Jun 03, 1965 MRN: [id number]   Date of Service  01/25/2018  HPI/Events of Note  Hypotension - BP = 88/56   eICU Interventions  Will bolus with 0.9 NaCl 500 mL IV over 30 minutes now.      Intervention Category Major Interventions: Hypotension - evaluation and management  Sommer,Steven Eugene 01/25/2018, 6:09 AM

## 2018-01-25 NOTE — Progress Notes (Signed)
PMT no charge note  Received page from bedside RN that the patient's family was at bedside and requested information about the patient's condition.   I discussed with the patient's sister over the phone, patient's daughter [name] was also on the phone call.   I re introduced palliative care as follows: Palliative medicine is specialized medical care for people living with serious illness. It focuses on providing relief from the symptoms and stress of a serious illness. The goal is to improve quality of life for both the patient and the family.  I stated that, based on our initial meeting on 01-24-18, a time trial of current therapies was being attempted.   I recapped that I had discussed about Ms Siana with her [facility] MD earlier today, I discussed with family about GI input as well as PT notes, based on my chart review.   I conveyed frankly and compassionately that the patient appears to be more eligible for residential hospice on discharge, rather than being able to tolerate a SNF rehab attempt.   Family asked for prognosis, stating that the patient has 2 daughters, one lives in [place], one lives in [place]. They'd like to plan for when they would have to come back in town, based on the patient's current condition and life expectancy.   Discussed about continuing current mode of care for now, offered another family meeting for further goals of care discussions. One of the daughters is going back to [place] tomorrow.   Plan: A repeat family meeting for further goals of care discussions and appropriate disposition determination has been scheduled for 0930 on 01-26-18. Full note to follow.   Loistine Chance MD [facility] health palliative medicine team [phone]

## 2018-01-25 NOTE — Progress Notes (Signed)
PROGRESS NOTE    Debbie Grimes  ZOX:[id number] DOB: [birth year] DOA: 01/22/2018 PCP: Patient, No Pcp Per   Brief Narrative:  This is a 52 y.o., female admitted on 01/22/2018 for acute on chronic decompensated liver disease secondary to alcohol abuse, presented to the ED with a Hgb of 4 s/p transfusion and developed hematemesis X3 further during hospital stay. She was intubated to protect airway and subsequently extubated successfully when no further bleeding observed. She was initially admitted to critical care service and then transferred to [facility] service after hemodynamically stabilized.   GI consulted and performed EGD which showed grade D erosive esophagitis; can't exclude underlying esophageal varices; portal hypertensive gastropathy. Pt continued on PPI drip and octreotide drip for 72 hours. S/p 3 units PRBC and Hb elevated from 4.7 to 9.9. No evidence of SBP on diagnostic paracentesis. However given GIB, she's on empiric iv Rocephin. No diuretics due to AKI. She's also on Xifaxan for hepatic encephalopathy. Advanced to clear liquid diet. AFP is normal. Discriminant score<32.   Prognosis is guarded and GI/critical care recommended hospice. Palliative care consulted. She's DNR.     Assessment & Plan:   Principal Problem:   Anemia due to GI blood loss Active Problems:   ETOH abuse   Tobacco use disorder   Protein-calorie malnutrition, severe   Gastrointestinal hemorrhage with hematemesis   Hyperbilirubinemia   AKI (acute kidney injury) ([place])   Metabolic acidosis with increased anion gap and accumulation of organic acids   Pressure injury of skin   Encounter for palliative care   Goals of care, counseling/discussion   Erosive esophagitis   Portal hypertensive gastropathy (HCC)   Alcoholic cirrhosis of liver with ascites (HCC)   NSVT (nonsustained ventricular tachycardia) ([place])   Plan: Anemia due to hematemesis with EGD finding of severe erosive esophagitis and can't exclude  varices; portal hypertensive gastropathy.  -continue iv PPI but d/c Octreotide (received 72 hours octreotide treatment) -no further hematemesis -Hb stable around 9.8 post 3 units PRBC; continue to monitor H&H -advanced to clear liquid diet  Alcoholic cirrhosis with decompensated portal HTN and ascites -no diuretics given AKI -no evidence of SBP on diagnostic tap. However, given GIB, will continue total 5 days of iv empiric Rocephin until this Saturday -continue Xafaxin for hepatic encephalopathy -no focal liver lesions on CT scan mid July. AFP normal this admission  AKI, possible hepatorenal syndrome or due to NSAIDS -no diuretics -avoid other nephrotoxin such as contrast, NSAIDS or fleets enema  Dyspnea: -pending CXR; if no pathology of cardiopulmonary source to explain dyspnea, then her ascites can contribute -therapeutic paracentesis can potentially make AKI worse -she also has NSVT on telemetry, will order ECHO; not able to order beta blocker due to low BP; mag and k level normal.   Overall prognosis is very poor and appreciate palliative care consult. She may benefit from hospice care.    DVT prophylaxis: SCD Code Status: DNR Family Communication: spoke to family at bedside Disposition Plan:  Possible hospice care?   Consultants:   Critical care; GI; palliative care  Procedures: EGD  Antimicrobials: Rocephin  Subjective: Hb stable, no further hematemesis; lethargic, generalized weakness. Also SOB. Cr stable at 1.36. Ascites + but no abdominal pain. NSVT reported by tele  Objective: Vitals:   01/25/18 1134 01/25/18 1149 01/25/18 1200 01/25/18 1300  BP: 110/76  (!) 83/56 (!) 86/58  Pulse: (!) 104  94 94  Resp: 19  (!) 25 (!) 21  Temp:  (!) 97.1 F (36.2  C)    TempSrc:  Axillary    SpO2: 100%  100% 100%  Weight:      Height:        Intake/Output Summary (Last 24 hours) at 01/25/2018 1411 Last data filed at 01/25/2018 1200 Gross per 24 hour  Intake 233.12 ml    Output 145 ml  Net 88.12 ml   Filed Weights   01/22/18 1722 01/23/18 0346 01/25/18 0216  Weight: 44.5 kg 46.4 kg 53.2 kg    Examination:  General exam: NAD but chronic ill appearance; lethargic and frail; severely malnourished; jaundiced Respiratory system: Clear to auscultation. Respiratory effort normal. Cardiovascular system: S1 & S2 heard, mild tachycardia. No JVD, murmurs, rubs, gallops or clicks. No pedal edema. Gastrointestinal system: Abdomen is distended with ascites, mild umbilicus protrusion; soft and nontender.  Normal bowel sounds heard. Central nervous system: lethargic. No focal neurological deficits. Extremities: Symmetric 5 x 5 power. Skin: some bruises over the lower extremity Psychiatry: flat affect    Data Reviewed: I have personally reviewed following labs and imaging studies  CBC: Recent Labs  Lab 01/22/18 1403 01/23/18 0439 01/23/18 1857 01/24/18 0806 01/24/18 1900 01/25/18 0729  WBC 9.5 7.8  --  6.2  --   --   NEUTROABS 7.5  --   --  5.0  --   --   HGB 4.7* 8.0* 10.3* 9.9* 8.9* 9.8*  HCT 13.3* 22.3* 29.2* 27.7* 25.2* 27.5*  MCV 89.9 84.5  --  84.2  --   --   PLT 164 106*  --  79*  --   --    Basic Metabolic Panel: Recent Labs  Lab 01/22/18 1403 01/22/18 1647 01/23/18 0439 01/24/18 0806  NA 127*  --  132* 138  K 2.9*  --  3.3* 3.9  CL 93*  --  103 109  CO2 20*  --  16* 16*  GLUCOSE 87  --  103* 93  BUN 27*  --  26* 25*  CREATININE 1.86*  --  1.39* 1.36*  CALCIUM 7.6*  --  6.7* 6.6*  MG  --  1.5*  --  1.8  PHOS 2.6  --   --   --    GFR: Estimated Creatinine Clearance: 40.6 mL/min (A) (by C-G formula based on SCr of 1.36 mg/dL (H)). Liver Function Tests: Recent Labs  Lab 01/22/18 1403 01/22/18 1647 01/23/18 0439 01/24/18 0806  AST 46*  --  40 53*  ALT 18  --  17 21  ALKPHOS 96  --  75 72  BILITOT 12.2* 12.1* 12.1* 12.9*  PROT 5.4*  --  4.8* 5.0*  ALBUMIN 1.9*  --  1.7* 1.7*   Recent Labs  Lab 01/22/18 1403  LIPASE 31    Recent Labs  Lab 01/22/18 1902  AMMONIA 67*   Coagulation Profile: Recent Labs  Lab 01/23/18 0439  INR 1.39   Cardiac Enzymes: Recent Labs  Lab 01/22/18 1403  TROPONINI <0.03   BNP (last 3 results) No results for input(s): PROBNP in the last 8760 hours. HbA1C: No results for input(s): HGBA1C in the last 72 hours. CBG: No results for input(s): GLUCAP in the last 168 hours. Lipid Profile: No results for input(s): CHOL, HDL, LDLCALC, TRIG, CHOLHDL, LDLDIRECT in the last 72 hours. Thyroid Function Tests: No results for input(s): TSH, T4TOTAL, FREET4, T3FREE, THYROIDAB in the last 72 hours. Anemia Panel: Recent Labs    01/22/18 1646 01/22/18 1647  VITAMINB12 1,883*  --   FOLATE  --  8.7  FERRITIN 300  --   TIBC 116*  --   IRON 104  --   RETICCTPCT  --  2.9   Sepsis Labs: No results for input(s): PROCALCITON, LATICACIDVEN in the last 168 hours.  Recent Results (from the past 240 hour(s))  Culture, body fluid-bottle     Status: None (Preliminary result)   Collection Time: 01/23/18  1:02 PM  Result Value Ref Range Status   Specimen Description PERITONEAL  Final   Special Requests NONE  Final   Culture   Final    NO GROWTH 2 DAYS Performed at [facility], [address] [place], [place] [address]    Report Status PENDING  Incomplete  Gram stain     Status: None   Collection Time: 01/23/18  1:02 PM  Result Value Ref Range Status   Specimen Description PERITONEAL  Final   Special Requests NONE  Final   Gram Stain   Final    RARE WBC PRESENT, PREDOMINANTLY MONONUCLEAR NO ORGANISMS SEEN Performed at [facility], [address], [place], [place] [address]    Report Status 01/23/2018 FINAL  Final  MRSA PCR Screening     Status: None   Collection Time: 01/24/18 12:15 PM  Result Value Ref Range Status   MRSA by PCR NEGATIVE NEGATIVE Final    Comment:        The GeneXpert MRSA Assay (FDA approved for NASAL specimens only), is one component of  a comprehensive MRSA colonization surveillance program. It is not intended to diagnose MRSA infection nor to guide or monitor treatment for MRSA infections. Performed at [facility], [place] [address] [place],  [address]          Radiology Studies: No results found.      Scheduled Meds: . chlorhexidine gluconate (MEDLINE KIT)  15 mL Mouth Rinse BID  . feeding supplement  1 Container Oral BID BM  . mouth rinse  15 mL Mouth Rinse BID  . multivitamin  15 mL Oral Daily  . [START ON 01/26/2018] pantoprazole  40 mg Intravenous Q12H  . prednisoLONE acetate  1 drop Left Eye Daily  . rifaximin  550 mg Oral BID  . trimethoprim-polymyxin b  1 drop Both Eyes Q6H   Continuous Infusions: . 0.9 % NaCl with KCl 40 mEq / L 10 mL/hr (01/25/18 0318)  . cefTRIAXone (ROCEPHIN)  IV Stopped (01/24/18 1843)  . dexmedetomidine (PRECEDEX) IV infusion 0.4 mcg/kg/hr (01/23/18 1225)  . pantoprozole (PROTONIX) infusion 8 mg/hr (01/25/18 1301)     LOS: 3 days    Time spent: 35 min    Paticia Stack, MD [facility] Pager [phone]  If 7PM-7AM, please contact night-coverage [email or web address] Password [email or web address] 01/25/2018, 2:11 PM

## 2018-01-25 NOTE — Progress Notes (Signed)
Family at bedside requesting to speak w/ MD regarding prognosis and goals of care. MD made aware. RN instructed to inform family goals of care could be discussed tomorrow morning. Family continued to be adamant about discussing goals of care this evening. Paged palliative care per family request.

## 2018-01-26 ENCOUNTER — Inpatient Hospital Stay (HOSPITAL_COMMUNITY): Payer: Medicaid Other

## 2018-01-26 DIAGNOSIS — R9389 Abnormal findings on diagnostic imaging of other specified body structures: Secondary | ICD-10-CM | POA: Diagnosis present

## 2018-01-26 DIAGNOSIS — R109 Unspecified abdominal pain: Secondary | ICD-10-CM

## 2018-01-26 DIAGNOSIS — E8770 Fluid overload, unspecified: Secondary | ICD-10-CM | POA: Diagnosis present

## 2018-01-26 DIAGNOSIS — I952 Hypotension due to drugs: Secondary | ICD-10-CM

## 2018-01-26 DIAGNOSIS — E8771 Transfusion associated circulatory overload: Secondary | ICD-10-CM

## 2018-01-26 DIAGNOSIS — D500 Iron deficiency anemia secondary to blood loss (chronic): Secondary | ICD-10-CM

## 2018-01-26 DIAGNOSIS — R1011 Right upper quadrant pain: Secondary | ICD-10-CM

## 2018-01-26 DIAGNOSIS — I351 Nonrheumatic aortic (valve) insufficiency: Secondary | ICD-10-CM

## 2018-01-26 DIAGNOSIS — E872 Acidosis: Secondary | ICD-10-CM

## 2018-01-26 DIAGNOSIS — K7290 Hepatic failure, unspecified without coma: Secondary | ICD-10-CM

## 2018-01-26 LAB — CBC
HCT: 31.3 % — ABNORMAL LOW (ref 36.0–46.0)
Hemoglobin: 11 g/dL — ABNORMAL LOW (ref 12.0–15.0)
MCH: 29.7 pg (ref 26.0–34.0)
MCHC: 35.1 g/dL (ref 30.0–36.0)
MCV: 84.6 fL (ref 78.0–100.0)
PLATELETS: 73 10*3/uL — AB (ref 150–400)
RBC: 3.7 MIL/uL — ABNORMAL LOW (ref 3.87–5.11)
RDW: 18.9 % — ABNORMAL HIGH (ref 11.5–15.5)
WBC: 8.6 10*3/uL (ref 4.0–10.5)

## 2018-01-26 LAB — COMPREHENSIVE METABOLIC PANEL
ALT: 29 U/L (ref 0–44)
AST: 73 U/L — AB (ref 15–41)
Albumin: 1.8 g/dL — ABNORMAL LOW (ref 3.5–5.0)
Alkaline Phosphatase: 79 U/L (ref 38–126)
Anion gap: 12 (ref 5–15)
BILIRUBIN TOTAL: 12.9 mg/dL — AB (ref 0.3–1.2)
BUN: 16 mg/dL (ref 6–20)
CO2: 15 mmol/L — ABNORMAL LOW (ref 22–32)
CREATININE: 0.94 mg/dL (ref 0.44–1.00)
Calcium: 7 mg/dL — ABNORMAL LOW (ref 8.9–10.3)
Chloride: 109 mmol/L (ref 98–111)
Glucose, Bld: 132 mg/dL — ABNORMAL HIGH (ref 70–99)
POTASSIUM: 3.5 mmol/L (ref 3.5–5.1)
Sodium: 136 mmol/L (ref 135–145)
TOTAL PROTEIN: 5.4 g/dL — AB (ref 6.5–8.1)

## 2018-01-26 LAB — ECHOCARDIOGRAM COMPLETE
HEIGHTINCHES: 64 in
Weight: 1721.35 oz

## 2018-01-26 LAB — BRAIN NATRIURETIC PEPTIDE: B Natriuretic Peptide: 1336.7 pg/mL — ABNORMAL HIGH (ref 0.0–100.0)

## 2018-01-26 LAB — HEMOGLOBIN AND HEMATOCRIT, BLOOD
HCT: 31.3 % — ABNORMAL LOW (ref 36.0–46.0)
HEMATOCRIT: 28.3 % — AB (ref 36.0–46.0)
HEMOGLOBIN: 10.2 g/dL — AB (ref 12.0–15.0)
HEMOGLOBIN: 10.9 g/dL — AB (ref 12.0–15.0)

## 2018-01-26 MED ORDER — POTASSIUM CHLORIDE CRYS ER 20 MEQ PO TBCR
40.0000 meq | EXTENDED_RELEASE_TABLET | Freq: Once | ORAL | Status: AC
  Administered 2018-01-26: 40 meq via ORAL
  Filled 2018-01-26: qty 2

## 2018-01-26 MED ORDER — FUROSEMIDE 20 MG PO TABS
10.0000 mg | ORAL_TABLET | Freq: Every day | ORAL | Status: DC
  Administered 2018-01-26: 10 mg via ORAL
  Filled 2018-01-26 (×2): qty 1

## 2018-01-26 MED ORDER — DOXYCYCLINE HYCLATE 100 MG PO TABS
100.0000 mg | ORAL_TABLET | Freq: Two times a day (BID) | ORAL | Status: DC
  Administered 2018-01-26 (×2): 100 mg via ORAL
  Filled 2018-01-26 (×3): qty 1

## 2018-01-26 MED ORDER — SPIRONOLACTONE 25 MG PO TABS
25.0000 mg | ORAL_TABLET | Freq: Every day | ORAL | Status: DC
  Administered 2018-01-26: 25 mg via ORAL
  Filled 2018-01-26 (×2): qty 1

## 2018-01-26 MED ORDER — SODIUM BICARBONATE 650 MG PO TABS
1300.0000 mg | ORAL_TABLET | Freq: Two times a day (BID) | ORAL | Status: DC
  Administered 2018-01-26 (×2): 1300 mg via ORAL
  Filled 2018-01-26 (×3): qty 2

## 2018-01-26 NOTE — Care Management Note (Signed)
Case Management Note  Patient Details  Name: Debbie Grimes MRN: [id number] Date of Birth: [birth year]  Subjective/Objective:                  Family meeting to discuss goc this am.  Action/Plan: Will follow for progression of care and clinical status. Will follow for case management needs none present at this time.  Expected Discharge Date:  (unknown)               Expected Discharge Plan:  Home/Self Care  In-House Referral:     Discharge planning Services  CM Consult  Post Acute Care Choice:    Choice offered to:     DME Arranged:    DME Agency:     HH Arranged:    HH Agency:     Status of Service:  In process, will continue to follow  If discussed at Long Length of Stay Meetings, dates discussed:    Additional Comments:  Leeroy Cha, RN 01/26/2018, 9:26 AM

## 2018-01-26 NOTE — Progress Notes (Addendum)
Chaplain follow up around [place].  Daughter, Museum/gallery conservator, at bedside.    Pt not oriented and unable to complete HCPOA paperwork at this time.  Provided paperwork and education with daughter.  Please page notary resource in hospital if pt is oriented and able to complete paperwork.     Jon'S ADULT DAUGHTERS ARE HEALTH CARE POWER OF ATTORNEY.      Louise has long-term boyfriend.  Daughter expresses concern that this person would have capacity to make decisions for Debbie Grimes's healthcare, as daughter lives in [place].

## 2018-01-26 NOTE — Progress Notes (Signed)
PROGRESS NOTE    ALAIRA LEVEL  BLT:[id number] DOB: 1965-09-08 DOA: 01/22/2018 PCP: Patient, No Pcp Per   Brief Narrative:  This is a 52 y.o., female admitted on 01/22/2018 for acute on chronic decompensated liver disease secondary to alcohol abuse, presented to the ED with a Hgb of 4 s/p transfusion and developed hematemesis X3 further during hospital stay. She was intubated to protect airway and subsequently extubated successfully when no further bleeding observed. She was initially admitted to critical care service and then transferred to [facility] service after hemodynamically stabilized.   GI consulted and performed EGD which showed grade D erosive esophagitis; can't exclude underlying esophageal varices; portal hypertensive gastropathy. Pt continued on PPI drip and octreotide drip for 72 hours. S/p 3 units PRBC and Hb elevated from 4.7 to 9.9. No evidence of SBP on diagnostic paracentesis. However given GIB, she's on empiric iv Rocephin. No diuretics due to AKI. She's also on Xifaxan for hepatic encephalopathy. Advanced to clear liquid diet. AFP is normal. Discriminant score<32.   Pt developed SOB on day 3. Repeated CXR showed mild increased diffuse right lung opacity concerning for possible pneumonia or asymmetric edema. BNP is markedly elevated compared with admission level, suspecting volume overload from transfusion. Started on po lasix and aldactone which is also for her ascites. The limiting factor for diuresis is her hypotension with SBP in 80's.   Prognosis is guarded and GI/critical care recommended hospice. Palliative care consulted. She's DNR.     Assessment & Plan:   Principal Problem:   Anemia due to GI blood loss Active Problems:   ETOH abuse   Tobacco use disorder   Protein-calorie malnutrition, severe   Gastrointestinal hemorrhage with hematemesis   Bilirubinemia   Metabolic acidosis, normal anion gap (NAG)   Pressure injury of skin   Encounter for palliative care  Goals of care, counseling/discussion   Erosive esophagitis   Portal hypertensive gastropathy (HCC)   Alcoholic cirrhosis of liver with ascites (HCC)   NSVT (nonsustained ventricular tachycardia) (HCC)   Decompensated hepatic cirrhosis (HCC)   Abnormal CXR   Volume overload   Hypotension due to drugs   Plan: Anemia due to hematemesis with EGD finding of severe erosive esophagitis and can't exclude varices; portal hypertensive gastropathy.  -continue iv PPI but d/c'ed Octreotide (received 72 hours octreotide treatment) -no further hematemesis -Hb stable around 10.2 post 3 units PRBC; continue to monitor H&H -advanced to clear liquid diet  Alcoholic cirrhosis with decompensated portal HTN and ascites -AKI resolved and started on lowest dose of lasix and aldactone (10 and 25 mg respectively) and monitor renal function -no evidence of SBP on diagnostic tap. However, given GIB, will continue total 5 days of iv empiric Rocephin until this Saturday -continue Xafaxin for hepatic encephalopathy -no focal liver lesions on CT scan mid July. AFP normal this admission  AKI, possible hepatorenal syndrome or due to NSAIDS -resolved -avoid other nephrotoxin such as contrast, NSAIDS or fleets enema  Dyspnea: -per CXR suspecting asymmetric pulmonary edema vs pneumonia; given BNP significantly more elevated and recent 3 units PRBC transfusion, suspecting volume overload; ordered low dose diuretics; pending ECHO -she also has NSVT on telemetry, pending ECHO; not able to order beta blocker due to low BP; replete potassium -add doxycycline to iv Rocephin to cover possible pneumonia  Drug induced hypotension -she's alert and orientated, therefore as long as mentally clear and good urine output, SBP in 80's is good for cirrhosis patient.  Overall prognosis is very poor and  appreciate palliative care consult. She may benefit from hospice care. I have a long conversation with families at bedside as well.     DVT prophylaxis: SCD Code Status: DNR Family Communication: spoke to family at bedside Disposition Plan:  Possible hospice care?   Consultants:   Critical care; GI; palliative care  Procedures: EGD  Antimicrobials: Rocephin  Subjective: Hb stable, no further hematemesis; today she seems to be more alert and awake than yesterday, generalized weakness. Also SOB. Cr now normalized. Ascites + but no abdominal pain. NSVT reported by tele. CXR showed pulmonary edema vs pneumonia, BNP is markedly elevated  Objective: Vitals:   01/26/18 1000 01/26/18 1100 01/26/18 1200 01/26/18 1300  BP: 93/72 92/65 110/80 (!) 88/61  Pulse: 93 94 95 88  Resp:    (!) 36  Temp:   (!) 97.5 F (36.4 C)   TempSrc:   Oral   SpO2: 95% 98% 98% 99%  Weight:      Height:        Intake/Output Summary (Last 24 hours) at 01/26/2018 1356 Last data filed at 01/26/2018 1300 Gross per 24 hour  Intake 947.29 ml  Output -  Net 947.29 ml   Filed Weights   01/23/18 0346 01/25/18 0216 01/26/18 0353  Weight: 46.4 kg 53.2 kg 48.8 kg    Examination:  General exam: NAD but chronic ill appearance; jaundiced and frail; severely malnourished; jaundiced Respiratory system: Clear to auscultation. Respiratory effort normal. Cardiovascular system: S1 & S2 heard, mild tachycardia. +rales but no murmurs, rubs, gallops or clicks. No pedal edema. Gastrointestinal system: Abdomen is distended with ascites, mild umbilicus protrusion; soft and nontender.  Normal bowel sounds heard. Central nervous system: lethargic. No focal neurological deficits. Extremities: Symmetric 5 x 5 power. Skin: some bruises over the lower extremity Psychiatry: flat affect    Data Reviewed: I have personally reviewed following labs and imaging studies  CBC: Recent Labs  Lab 01/22/18 1403 01/23/18 0439  01/24/18 0806 01/24/18 1900 01/25/18 0729 01/25/18 1859 01/26/18 0341 01/26/18 0648  WBC 9.5 7.8  --  6.2  --   --   --  8.6  --    NEUTROABS 7.5  --   --  5.0  --   --   --   --   --   HGB 4.7* 8.0*   < > 9.9* 8.9* 9.8* 9.9* 11.0* 10.2*  HCT 13.3* 22.3*   < > 27.7* 25.2* 27.5* 27.6* 31.3* 28.3*  MCV 89.9 84.5  --  84.2  --   --   --  84.6  --   PLT 164 106*  --  79*  --   --   --  73*  --    < > = values in this interval not displayed.   Basic Metabolic Panel: Recent Labs  Lab 01/22/18 1403 01/22/18 1647 01/23/18 0439 01/24/18 0806 01/26/18 0341  NA 127*  --  132* 138 136  K 2.9*  --  3.3* 3.9 3.5  CL 93*  --  103 109 109  CO2 20*  --  16* 16* 15*  GLUCOSE 87  --  103* 93 132*  BUN 27*  --  26* 25* 16  CREATININE 1.86*  --  1.39* 1.36* 0.94  CALCIUM 7.6*  --  6.7* 6.6* 7.0*  MG  --  1.5*  --  1.8  --   PHOS 2.6  --   --   --   --    GFR: Estimated Creatinine  Clearance: 53.9 mL/min (by C-G formula based on SCr of 0.94 mg/dL). Liver Function Tests: Recent Labs  Lab 01/22/18 1403 01/22/18 1647 01/23/18 0439 01/24/18 0806 01/26/18 0341  AST 46*  --  40 53* 73*  ALT 18  --  _0 ALKPHOS 96  --  75 72 79  BILITOT 12.2* 12.1* 12.1* 12.9* 12.9*  PROT 5.4*  --  4.8* 5.0* 5.4*  ALBUMIN 1.9*  --  1.7* 1.7* 1.8*   Recent Labs  Lab 01/22/18 1403  LIPASE 31   Recent Labs  Lab 01/22/18 1902  AMMONIA 67*   Coagulation Profile: Recent Labs  Lab 01/23/18 0439  INR 1.39   Cardiac Enzymes: Recent Labs  Lab 01/22/18 1403  TROPONINI <0.03   BNP (last 3 results) No results for input(s): PROBNP in the last 8760 hours. HbA1C: No results for input(s): HGBA1C in the last 72 hours. CBG: No results for input(s): GLUCAP in the last 168 hours. Lipid Profile: No results for input(s): CHOL, HDL, LDLCALC, TRIG, CHOLHDL, LDLDIRECT in the last 72 hours. Thyroid Function Tests: No results for input(s): TSH, T4TOTAL, FREET4, T3FREE, THYROIDAB in the last 72 hours. Anemia Panel: No results for input(s): VITAMINB12, FOLATE, FERRITIN, TIBC, IRON, RETICCTPCT in the last 72 hours. Sepsis Labs: No  results for input(s): PROCALCITON, LATICACIDVEN in the last 168 hours.  Recent Results (from the past 240 hour(s))  Culture, body fluid-bottle     Status: None (Preliminary result)   Collection Time: 01/23/18  1:02 PM  Result Value Ref Range Status   Specimen Description PERITONEAL  Final   Special Requests NONE  Final   Culture   Final    NO GROWTH 3 DAYS Performed at [facility], [address], [place], [place] [address]    Report Status PENDING  Incomplete  Gram stain     Status: None   Collection Time: 01/23/18  1:02 PM  Result Value Ref Range Status   Specimen Description PERITONEAL  Final   Special Requests NONE  Final   Gram Stain   Final    RARE WBC PRESENT, PREDOMINANTLY MONONUCLEAR NO ORGANISMS SEEN Performed at [facility], [address] [place], [place] [address]    Report Status 01/23/2018 FINAL  Final  MRSA PCR Screening     Status: None   Collection Time: 01/24/18 12:15 PM  Result Value Ref Range Status   MRSA by PCR NEGATIVE NEGATIVE Final    Comment:        The GeneXpert MRSA Assay (FDA approved for NASAL specimens only), is one component of a comprehensive MRSA colonization surveillance program. It is not intended to diagnose MRSA infection nor to guide or monitor treatment for MRSA infections. Performed at [facility], [place] [address], [place], [place] [address]          Radiology Studies: Dg Chest Port 1 View  Result Date: 01/25/2018 CLINICAL DATA:  Dyspnea. EXAM: PORTABLE CHEST 1 VIEW COMPARISON:  Radiograph of January 23, 2018. FINDINGS: The heart size and mediastinal contours are within normal limits. Endotracheal tube has been removed. No pneumothorax is noted. Mildly increased diffuse right lung opacity is noted concerning for possible pneumonia or asymmetric edema, most prominently seen in right upper lobe. Minimal right pleural effusion is noted. The visualized skeletal structures are unremarkable.  IMPRESSION: Mildly increased diffuse right lung opacity is noted concerning for possible pneumonia or asymmetric edema, with minimal right pleural effusion. Electronically Signed   By:  Marijo Conception, M.D.   On: 01/25/2018 14:28        Scheduled Meds: . chlorhexidine gluconate (MEDLINE KIT)  15 mL Mouth Rinse BID  . doxycycline  100 mg Oral Q12H  . feeding supplement  1 Container Oral BID BM  . furosemide  10 mg Oral Daily  . mouth rinse  15 mL Mouth Rinse BID  . multivitamin  15 mL Oral Daily  . pantoprazole  40 mg Intravenous Q12H  . prednisoLONE acetate  1 drop Left Eye Daily  . rifaximin  550 mg Oral BID  . sodium bicarbonate  1,300 mg Oral BID  . spironolactone  25 mg Oral Daily  . trimethoprim-polymyxin b  1 drop Both Eyes Q6H   Continuous Infusions: . 0.9 % NaCl with KCl 40 mEq / L 10 mL/hr at 01/26/18 1300  . cefTRIAXone (ROCEPHIN)  IV Stopped (01/25/18 1813)  . dexmedetomidine (PRECEDEX) IV infusion 0.4 mcg/kg/hr (01/23/18 1225)     LOS: 4 days    Time spent: 35 min    Paticia Stack, MD [facility] Pager [phone]  If 7PM-7AM, please contact night-coverage [email or web address] Password [email or web address] 01/26/2018, 1:56 PM

## 2018-01-26 NOTE — Progress Notes (Signed)
Chaplain support due to spiritual care consult.    Pt had several visitors at bedside.  Stated that her sister has paperwork for [facility] and would like to discuss when her sister is back at hospital in about an hour.    Chaplain will follow up to expedite HCPOA per palliative request.

## 2018-01-26 NOTE — Progress Notes (Addendum)
[facility] Progress Note   Chief Complaint:   Cirrhosis , GI bleed   SUBJECTIVE:    No specific complaints other than tired and hard time breathing   ASSESSMENT AND PLAN:   1. 52 yo female with decompensatedETOHcirrhosis, high MELD. DF <32. Poor prognosis. Presented with severe anemia, developed hematemesis this admission.Severe esophagitis on EGD, underlying varices could not be excluded though no varices were seen on EGD in June of this year. -DNR now. No taking much PO.  -Continue with IV PPI per GI bleed protocol -no evidencefor SBP on diagnostic tap yesterday.Given GI bleed with continue empiric Rocephin for total of 5 days ( last dose on Saturday) -Continue Xifaxan.  -Diureticshave on hold given AKI which has resolved. She did just get a dose of IV lasix.  -Diet as tolerated -Family meeting again with Palliative Medicine team this am to discuss goals of care  2. Dyspnea -CXR concerning for PNA. Dyspnea may from PNA and /or ascites. Will let admitting team review CXR and treat if appropriate. If they feel findings don't warrant treatment then recommend therapeutic paracentesis.  -I explained to family that a paracentesis will likely help breathing but would possibly be detrimental to kidneys. Family asks about renal function again today. At some point I think comfort measures will be more important.    3. Upper GI bleed from severe ulcerative esophagitis / ABL anemia. Hgb up to 10.2 post transfusion.  -continue PPI, anti-reflux measures  3. l  OBJECTIVE:     Vital signs in last 24 hours: Temp:  [97.1 F (36.2 C)-98.6 F (37 C)] 97.6 F (36.4 C) (10/04 0353) Pulse Rate:  [92-109] 97 (10/04 0600) Resp:  [12-32] 31 (10/04 0600) BP: (81-110)/(47-76) 84/54 (10/04 0600) SpO2:  [90 %-100 %] 96 % (10/04 0600) Weight:  [48.8 kg] 48.8 kg (10/04 0353) Last BM Date: 01/23/18 General:   Alert, female  in NAD. More awake today EENT:  Normal  hearing, icteric sclera Heart:  Regular rate and rhythm, no lower extremity edema Pulm: labored breathing. Coarse breath sounds throughout.  Abdomen:  Soft, moderate-largely distended, nontender.  Normal bowel sounds.     Neurologic:  Alert and  oriented x4;  grossly normal neurologically. Psych:  Pleasant, cooperative.  Normal mood and affect.   Intake/Output from previous day: 10/03 0701 - 10/04 0700 In: 724.5 [I.V.:624.5; IV Piggyback:100] Out: -  Intake/Output this shift: No intake/output data recorded.  Lab Results: Recent Labs    01/24/18 0806  01/25/18 1859 01/26/18 0341 01/26/18 0648  WBC 6.2  --   --  8.6  --   HGB 9.9*   < > 9.9* 11.0* 10.2*  HCT 27.7*   < > 27.6* 31.3* 28.3*  PLT 79*  --   --  73*  --    < > = values in this interval not displayed.   BMET Recent Labs    01/24/18 0806 01/26/18 0341  NA 138 136  K 3.9 3.5  CL 109 109  CO2 16* 15*  GLUCOSE 93 132*  BUN 25* 16  CREATININE 1.36* 0.94  CALCIUM 6.6* 7.0*   LFT Recent Labs    01/26/18 0341  PROT 5.4*  ALBUMIN 1.8*  AST 73*  ALT 29  ALKPHOS 79  BILITOT 12.9*    Dg Chest Port 1 View  Result Date: 01/25/2018 CLINICAL DATA:  Dyspnea. EXAM: PORTABLE CHEST 1 VIEW COMPARISON:  Radiograph of January 23, 2018. FINDINGS: The heart size and mediastinal contours  are within normal limits. Endotracheal tube has been removed. No pneumothorax is noted. Mildly increased diffuse right lung opacity is noted concerning for possible pneumonia or asymmetric edema, most prominently seen in right upper lobe. Minimal right pleural effusion is noted. The visualized skeletal structures are unremarkable. IMPRESSION: Mildly increased diffuse right lung opacity is noted concerning for possible pneumonia or asymmetric edema, with minimal right pleural effusion. Electronically Signed   By: Marijo Conception, M.D.   On: 01/25/2018 14:28    Principal Problem:   Anemia due to GI blood loss Active Problems:   ETOH abuse    Tobacco use disorder   Protein-calorie malnutrition, severe   Gastrointestinal hemorrhage with hematemesis   Bilirubinemia   Metabolic acidosis, normal anion gap (NAG)   Pressure injury of skin   Encounter for palliative care   Goals of care, counseling/discussion   Erosive esophagitis   Portal hypertensive gastropathy (HCC)   Alcoholic cirrhosis of liver with ascites (HCC)   NSVT (nonsustained ventricular tachycardia) (HCC)   Decompensated hepatic cirrhosis ([place])   Abnormal CXR     LOS: 4 days   Tye Savoy ,NP 01/26/2018, 9:16 AM    Attending physician's note   I have taken an interval history, reviewed the chart and examined the patient. I agree with the Advanced Practitioner's note, impression and recommendations.  Overall poor prognosis with severe malnutrition and decompensated alcoholic cirrhosis Hemoglobin remained stable Dyspnea progressively worse.  Patient did not wish to pursue paracentesis at this point. Palliative care to meet with the patient and family today GI will sign off, we are available if needed.  Please call with any questions or concerns  K. Denzil Magnuson , MD [phone]

## 2018-01-26 NOTE — Progress Notes (Addendum)
Update:Discharge Plan  CSW received follow up call from [facility]- the Patient is ineligible for [facility].   CSW discussed other options with the patient daughter and sister.CSW provided them with a list of other residential facilities in [facility] and surrounding area.   Patient daughter states they will need time to make a decision- [facility] vs. Home w/ Hospice.  CSW initiated a referral to [facility].   Weekend CSW will follow up with the family.  CSW notified nurse staff of the plan.   Kathrin Greathouse, Marlinda Mike, MSW Clinical Social Worker  [phone] 01/26/2018  3:48 PM

## 2018-01-26 NOTE — Progress Notes (Signed)
  Echocardiogram 2D Echocardiogram has been performed.  Debbie Grimes 01/26/2018, 12:49 PM

## 2018-01-26 NOTE — Progress Notes (Signed)
Daily Progress Note   Patient Name: Debbie Grimes       Date: 01/26/2018 DOB: Aug 19, 1965  Age: 52 y.o. MRN#: [id number] Attending Physician: Paticia Stack, MD Primary Care Physician: Patient, No Pcp Per Admit Date: 01/22/2018  Reason for Consultation/Follow-up: Establishing goals of care  Subjective:  Patient appears restless, she states she doesn't have pain, but that she didn't sleep well overnight.   Patient's sister, significant other, and patient's daughter [name] are present at the bedside. Family meeting ensured, see below.   Length of Stay: 4  Current Medications: Scheduled Meds:  . chlorhexidine gluconate (MEDLINE KIT)  15 mL Mouth Rinse BID  . doxycycline  100 mg Oral Q12H  . feeding supplement  1 Container Oral BID BM  . furosemide  10 mg Oral Daily  . mouth rinse  15 mL Mouth Rinse BID  . multivitamin  15 mL Oral Daily  . pantoprazole  40 mg Intravenous Q12H  . prednisoLONE acetate  1 drop Left Eye Daily  . rifaximin  550 mg Oral BID  . sodium bicarbonate  1,300 mg Oral BID  . spironolactone  25 mg Oral Daily  . trimethoprim-polymyxin b  1 drop Both Eyes Q6H    Continuous Infusions: . 0.9 % NaCl with KCl 40 mEq / L 10 mL/hr at 01/26/18 0600  . cefTRIAXone (ROCEPHIN)  IV Stopped (01/25/18 1813)  . dexmedetomidine (PRECEDEX) IV infusion 0.4 mcg/kg/hr (01/23/18 1225)  . pantoprozole (PROTONIX) infusion 8 mg/hr (01/26/18 0854)    PRN Meds: calcium carbonate, guaiFENesin, ondansetron **OR** ondansetron (ZOFRAN) IV, phenol, polyethylene glycol  Physical Exam         Frail weak lady Awake alert She is able to participate in discussions Labored breathing, she has coarse breath sounds S 1 S 2 Abdomen is firm and distended, with generalized mild tenderness Mild  distress Trace edema  Vital Signs: BP (!) 84/54   Pulse 97   Temp 97.6 F (36.4 C) (Oral)   Resp (!) 31   Ht 5' 4"  (1.626 m)   Wt 48.8 kg   SpO2 96%   BMI 18.47 kg/m  SpO2: SpO2: 96 % O2 Device: O2 Device: Nasal Cannula O2 Flow Rate: O2 Flow Rate (L/min): 4 L/min  Intake/output summary:   Intake/Output Summary (Last 24 hours) at 01/26/2018 1007 Last  data filed at 01/26/2018 0600 Gross per 24 hour  Intake 724.51 ml  Output -  Net 724.51 ml   LBM: Last BM Date: 01/23/18 Baseline Weight: Weight: 44.5 kg Most recent weight: Weight: 48.8 kg       Palliative Assessment/Data:      Patient Active Problem List   Diagnosis Date Noted  . Abnormal CXR 01/26/2018  . Erosive esophagitis 01/25/2018  . Portal hypertensive gastropathy ([place]) 01/25/2018  . Alcoholic cirrhosis of liver with ascites ([place]) 01/25/2018  . NSVT (nonsustained ventricular tachycardia) ([place]) 01/25/2018  . Decompensated hepatic cirrhosis ([place])   . Encounter for palliative care   . Goals of care, counseling/discussion   . Pressure injury of skin 01/23/2018  . Gastrointestinal hemorrhage with hematemesis 01/22/2018  . Bilirubinemia 01/22/2018  . Metabolic acidosis, normal anion gap (NAG) 01/22/2018  . Heme positive stool   . Benign neoplasm of ascending colon   . Benign neoplasm of cecum   . Benign neoplasm of transverse colon   . Anemia due to blood loss 10/10/2017  . Malnutrition of moderate degree 10/10/2017  . Anemia due to GI blood loss 10/09/2017  . Weakness   . Chronic obstructive pulmonary disease ([place])   . 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 Hepatic steatosis 09/30/2015  . Anemia, iron deficiency   . Chronic hepatitis C without hepatic coma ([place])   . Alcoholic liver disease ([place])   . Protein-calorie malnutrition, severe 09/29/2015  . Alcohol withdrawal ([place]) 09/28/2015  . Normocytic anemia 07/12/2015  . Leg wound, left 07/10/2015  . ETOH  abuse 07/10/2015  . Tobacco use disorder 07/10/2015    Palliative Care Assessment & Plan   Patient Profile:    Assessment:  decompensated ETOH cirrhosis High MELD score Severe anemia GI bleed earlier in this hospitalization S/p short term VDRF Ascites Frailty Generalized deconditioning and ongoing decline Low Albumin Severe ulcerative esophagitis.  Possible PNA.   Recommendations/Plan:   Family meeting at the patient's bedside this morning, patient was awake alert and participated fully: We reviewed the above mentioned conditions, acute and underlying. Decline trajectory, from a worsening end stage liver disease trans point explained in detail. Signs and symptoms at end of life also reviewed.   Discussed with family about continuing current mode of care, possibly using antibiotics, possibly considering paracentesis.   Patient doesn't want any more procedures, she doesn't want paracentesis.   Alternatively, we discussed about full scope of comfort measures and then also discussed about the type of care that can be provided in a residential hospice setting, the patient and her family would elect for residential hospice, at this point.   Daughter wants to see a chaplain and complete HCPOA paperwork. This has been requested.  Continue current mode of care for now. Patient too frail and weak to participate in PT, appears to have a poor prognosis regardless of interventions employed.   All of the patient and family's questions and concerns addressed to the best of my ability.   Code Status:    Code Status Orders  (From admission, onward)         Start     Ordered   01/23/18 1745  Do not attempt resuscitation (DNR)  Continuous    Question Answer Comment  In the event of cardiac or respiratory ARREST Do not call a "code blue"   In the event of cardiac or respiratory ARREST Do not perform Intubation, CPR, defibrillation or ACLS  In the event of cardiac or respiratory ARREST  Use medication by any route, position, wound care, and other measures to relive pain and suffering. May use oxygen, suction and manual treatment of airway obstruction as needed for comfort.      01/23/18 1744        Code Status History    Date Active Date Inactive Code Status Order ID Comments User Context   01/22/2018 1846 01/23/2018 1744 Full Code [id number]  Mercy Riding, MD Inpatient   10/09/2017 1957 10/11/2017 2013 Full Code [id number]  Bufford Lope, DO ED   09/28/2015 1634 10/02/2015 1603 Full Code [id number]  Zada Finders, MD ED       Prognosis:   < 2 weeks  Discharge Planning:  Hospice facility  Care plan was discussed with  Patient, sister, daughter, Evelena Peat her significant other.   Thank you for allowing the Palliative Medicine Team to assist in the care of this patient.   Time In: 9.30 Time Out: 10.05 Total Time 35 Prolonged Time Billed  no       Greater than 50%  of this time was spent counseling and coordinating care related to the above assessment and plan.  Loistine Chance, MD [phone]  Please contact Palliative Medicine Team phone at [phone] for questions and concerns.

## 2018-01-26 NOTE — Clinical Social Work Note (Signed)
Clinical Social Work Assessment  Patient Details  Name: Debbie Grimes MRN: [id number] Date of Birth: 01-17-1966  Date of referral:  01/26/18               Reason for consult:  Discharge Planning, End of Life/Hospice                Permission sought to share information with:  Facility Sport and exercise psychologist, Family Supports Permission granted to share information::     Name::        Agency::  [facility]  Relationship::  Daughter/Sister  Contact Information:     Housing/Transportation Living arrangements for the past 2 months:  [place] of Information:  Patient Patient Interpreter Needed:  None Criminal Activity/Legal Involvement Pertinent to Current Situation/Hospitalization:  No - Comment as needed Significant Relationships:  Warehouse manager Lives with:  Significant Other Do you feel safe going back to the place where you live?  Yes Need for family participation in patient care:  Yes(terminally ill)  Care giving concerns:   Residential Hospice  Social Worker assessment / plan:  CSW met with the patient daughter and sister and bedside to discuss discharge plan to residential hospice. Patient sitting up in bed attentive to conversation but allowed her daughter to direct the conversation. Patient daughter is hoping that [facility] will accept due to the convenience it will be for other family members.  CSW explain process.  CSW reached out to [place] with [facility] to initiate the referral process.   Plan: Residential Hospice  Employment status:  Disabled (Comment on whether or not currently receiving Disability) Insurance information:  Medicare PT Recommendations:  [place] / Referral to community resources:     Patient/Family's Response to care:  Agreeable and Responding well to care.   Patient/Family's Understanding of and Emotional Response to Diagnosis, Current Treatment, and Prognosis: Patient daughter express  concerns about the patient having privacy and feeling comfortable at [facility].   Emotional Assessment Appearance:  Appears stated age Attitude/Demeanor/Rapport:    Affect (typically observed):  Unable to Assess Orientation:  Oriented to Self Alcohol / Substance use:  Alcohol Use, Tobacco Use, Illicit Drugs Psych involvement (Current and /or in the community):  No (Comment)  Discharge Needs  Concerns to be addressed:  Discharge Planning Concerns Readmission within the last 30 days:  No Current discharge risk:  Terminally ill Barriers to Discharge:  No Barriers Identified   Lia Hopping, LCSW 01/26/2018, 2:05 PM

## 2018-01-26 NOTE — Progress Notes (Signed)
CSW consult- Residential Hospice "[facility]"  CSW actively working to assist with discharge plan to residential hospice.    Kathrin Greathouse, Marlinda Mike, MSW Clinical Social Worker  [phone] 01/26/2018  11:39 AM

## 2018-01-27 LAB — COMPREHENSIVE METABOLIC PANEL
ALBUMIN: 1.8 g/dL — AB (ref 3.5–5.0)
ALT: 34 U/L (ref 0–44)
AST: 75 U/L — AB (ref 15–41)
Alkaline Phosphatase: 77 U/L (ref 38–126)
Anion gap: 10 (ref 5–15)
BUN: 17 mg/dL (ref 6–20)
CO2: 17 mmol/L — AB (ref 22–32)
CREATININE: 1.22 mg/dL — AB (ref 0.44–1.00)
Calcium: 7.6 mg/dL — ABNORMAL LOW (ref 8.9–10.3)
Chloride: 112 mmol/L — ABNORMAL HIGH (ref 98–111)
GFR calc Af Amer: 58 mL/min — ABNORMAL LOW (ref >60)
GFR calc non Af Amer: 50 mL/min — ABNORMAL LOW (ref >60)
GLUCOSE: 128 mg/dL — AB (ref 70–99)
Potassium: 4.4 mmol/L (ref 3.5–5.1)
SODIUM: 139 mmol/L (ref 135–145)
Total Bilirubin: 13.3 mg/dL — ABNORMAL HIGH (ref 0.3–1.2)
Total Protein: 5.3 g/dL — ABNORMAL LOW (ref 6.5–8.1)

## 2018-01-27 LAB — CBC
HCT: 29 % — ABNORMAL LOW (ref 36.0–46.0)
Hemoglobin: 10.2 g/dL — ABNORMAL LOW (ref 12.0–15.0)
MCH: 29.3 pg (ref 26.0–34.0)
MCHC: 35.2 g/dL (ref 30.0–36.0)
MCV: 83.3 fL (ref 78.0–100.0)
PLATELETS: 116 10*3/uL — AB (ref 150–400)
RBC: 3.48 MIL/uL — ABNORMAL LOW (ref 3.87–5.11)
RDW: 19.1 % — ABNORMAL HIGH (ref 11.5–15.5)
WBC: 12.7 10*3/uL — ABNORMAL HIGH (ref 4.0–10.5)

## 2018-01-27 LAB — MAGNESIUM: MAGNESIUM: 1.3 mg/dL — AB (ref 1.7–2.4)

## 2018-01-27 MED ORDER — MORPHINE SULFATE (CONCENTRATE) 10 MG/0.5ML PO SOLN: 10.0000 mg | ORAL | Status: DC | PRN

## 2018-01-27 MED ORDER — MORPHINE SULFATE (CONCENTRATE) 10 MG/0.5ML PO SOLN
10.0000 mg | ORAL | Status: DC
  Administered 2018-01-27 – 2018-01-28 (×5): 10 mg via SUBLINGUAL
  Filled 2018-01-27 (×5): qty 0.5

## 2018-01-27 MED ORDER — HYDROMORPHONE HCL 1 MG/ML IJ SOLN
0.5000 mg | INTRAMUSCULAR | Status: DC | PRN
  Administered 2018-01-27: 0.5 mg via INTRAVENOUS
  Filled 2018-01-27: qty 0.5

## 2018-01-27 NOTE — Progress Notes (Deleted)
Patient will not keep nasal canula on at this time, writer made multiple attempts and educated patient on importance. Will continue to monitor.

## 2018-01-27 NOTE — Progress Notes (Signed)
Daily Progress Note   Patient Name: Debbie Grimes       Date: 01/27/2018 DOB: [birth year]  Age: 52 y.o. MRN#: [id number] Attending Physician: Georgette Shell, MD Primary Care Physician: Patient, No Pcp Per Admit Date: 01/22/2018  Reason for Consultation/Follow-up: Establishing goals of care  Subjective:  Patient appears restless, she states she doesn't have pain, but that she didn't sleep well overnight.   Patient's sister, significant other, and patient's daughter Debbie Grimes are present at the bedside. Family meeting ensured, see below.   Length of Stay: 5  Current Medications: Scheduled Meds:  . chlorhexidine gluconate (MEDLINE KIT)  15 mL Mouth Rinse BID  . doxycycline  100 mg Oral Q12H  . feeding supplement  1 Container Oral BID BM  . furosemide  10 mg Oral Daily  . mouth rinse  15 mL Mouth Rinse BID  . multivitamin  15 mL Oral Daily  . pantoprazole  40 mg Intravenous Q12H  . prednisoLONE acetate  1 drop Left Eye Daily  . rifaximin  550 mg Oral BID  . sodium bicarbonate  1,300 mg Oral BID  . spironolactone  25 mg Oral Daily  . trimethoprim-polymyxin b  1 drop Both Eyes Q6H    Continuous Infusions: . 0.9 % NaCl with KCl 40 mEq / L 10 mL/hr at 01/26/18 2000  . cefTRIAXone (ROCEPHIN)  IV Stopped (01/26/18 1906)  . dexmedetomidine (PRECEDEX) IV infusion 0.4 mcg/kg/hr (01/23/18 1225)    PRN Meds: calcium carbonate, guaiFENesin, HYDROmorphone (DILAUDID) injection, morphine CONCENTRATE, ondansetron **OR** ondansetron (ZOFRAN) IV, phenol, polyethylene glycol  Physical Exam         Frail weak lady She is not alert, not able to participate in discussions Labored breathing, she has coarse breath sounds S 1 S 2 Abdomen is firm and distended, with generalized mild  tenderness Mild distress Trace edema  Vital Signs: BP (!) 124/112 (BP Location: Left Arm)   Pulse (!) 105   Temp 98.5 F (36.9 C) (Oral)   Resp (!) 22   Ht _0  (1.702 m)   Wt 50.4 kg   SpO2 93%   BMI 17.40 kg/m  SpO2: SpO2: 93 % O2 Device: O2 Device: Nasal Cannula O2 Flow Rate: O2 Flow Rate (L/min): 4 L/min  Intake/output summary:   Intake/Output Summary (Last 24 hours) at 01/27/2018  1316 Last data filed at 01/27/2018 0600 Gross per 24 hour  Intake 294.68 ml  Output -  Net 294.68 ml   LBM: Last BM Date: 01/26/18 Baseline Weight: Weight: 44.5 kg Most recent weight: Weight: 50.4 kg       Palliative Assessment/Data:      Patient Active Problem List   Diagnosis Date Noted  . Abnormal CXR 01/26/2018  . Volume overload 01/26/2018  . Hypotension due to drugs 01/26/2018  . Abdominal pain   . Erosive esophagitis 01/25/2018  . Portal hypertensive gastropathy ([place]) 01/25/2018  . Alcoholic cirrhosis of liver with ascites ([place]) 01/25/2018  . NSVT (nonsustained ventricular tachycardia) ([place]) 01/25/2018  . Decompensated hepatic cirrhosis ([place])   . Encounter for palliative care   . Goals of care, counseling/discussion   . Pressure injury of skin 01/23/2018  . Gastrointestinal hemorrhage with hematemesis 01/22/2018  . Hyperbilirubinemia 01/22/2018  . Metabolic acidosis, normal anion gap (NAG) 01/22/2018  . Heme positive stool   . Benign neoplasm of ascending colon   . Benign neoplasm of cecum   . Benign neoplasm of transverse colon   . Anemia due to blood loss 10/10/2017  . Malnutrition of moderate degree 10/10/2017  . Anemia due to GI blood loss 10/09/2017  . Weakness   . Chronic obstructive pulmonary disease ([place])   . 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 Hepatic steatosis 09/30/2015  . Anemia, iron deficiency   . Chronic hepatitis C without hepatic coma ([place])   . Alcoholic liver disease ([place])   . Protein-calorie  malnutrition, severe 09/29/2015  . Alcohol withdrawal ([place]) 09/28/2015  . Normocytic anemia 07/12/2015  . Leg wound, left 07/10/2015  . ETOH abuse 07/10/2015  . Tobacco use disorder 07/10/2015    Palliative Care Assessment & Plan   Patient Profile:    Assessment:  decompensated ETOH cirrhosis High MELD score Severe anemia GI bleed earlier in this hospitalization S/p short term VDRF Ascites Frailty Generalized deconditioning and ongoing decline Low Albumin Severe ulcerative esophagitis.  Possible PNA.   Recommendations/Plan:   Family meeting at the patient's bedside this morning,patient unfortunately is not as awake, she is not alert. She opens her eyes and appears to be moaning restlessly.  We discussed about appropriate symptom management. Will add IV Dilaudid on an as-needed basis, will add low-dose morphine to be used sublingually on a scheduled basis and as-needed basis.  Patient continues to show sequelae of end-stage liver disease.  Prognosis appears to be few days to less than 2 weeks at this point. Continue to focus on full scope of comfort measures Discussed with [facility] liaison about the patient transitioning to [facility] place towards the end of this hospitalization. Appreciate [facility] liaison input and assistance.       All of the patient and family's questions and concerns addressed to the best of my ability.   Code Status:    Code Status Orders  (From admission, onward)         Start     Ordered   01/23/18 1745  Do not attempt resuscitation (DNR)  Continuous    Question Answer Comment  In the event of cardiac or respiratory ARREST Do not call a "code blue"   In the event of cardiac or respiratory ARREST Do not perform Intubation, CPR, defibrillation or ACLS   In the event of cardiac or respiratory ARREST Use medication by any route, position, wound care, and other measures to relive pain and suffering.  May use oxygen, suction and manual treatment of airway  obstruction as needed for comfort.      01/23/18 1744        Code Status History    Date Active Date Inactive Code Status Order ID Comments User Context   01/22/2018 1846 01/23/2018 1744 Full Code [id number]  Mercy Riding, MD Inpatient   10/09/2017 1957 10/11/2017 2013 Full Code [id number]  Bufford Lope, DO ED   09/28/2015 1634 10/02/2015 1603 Full Code [id number]  Zada Finders, MD ED       Prognosis:  Few days to  < 2 weeks  Discharge Planning:  Hospice facility  Care plan was discussed with  Patient, sister, daughter, Evelena Peat her significant other.   Thank you for allowing the Palliative Medicine Team to assist in the care of this patient.   Time In: 12.30 Time Out: 1305 Total Time 35 Prolonged Time Billed  no       Greater than 50%  of this time was spent counseling and coordinating care related to the above assessment and plan.  Loistine Chance, MD [phone]  Please contact Palliative Medicine Team phone at [phone] for questions and concerns.

## 2018-01-27 NOTE — Clinical Social Work Note (Addendum)
CSW received phone call from [place] from [place], who said family had met with her and toured the hospice home, but they would like [facility].  CSW was informed that [facility] was not going to accept patient, and family was asking to speak with [facility] again to see if they have changed their mind.  CSW attempted to call Bevely Palmer from [facility] at [phone] to ask if she will speak with the family again.  CSW spoke to Bevely Palmer at [facility] she asked facility to review patient's information due to a change in symptoms.  Bevely Palmer stated she will call CSW back with a decision by [facility].  1:15pm CSW received phone call from Audrea Muscat at [facility], they have approved patient to come to [facility] either later this afternoon or tomorrow morning.  Audrea Muscat will contact CSW to confirm when patient will be able to transfer.  2:45pm  CSW received phone call from Audrea Muscat from [facility], they have agreed to accept patient, however they are not able to take her till tomorrow morning.  CSW updated physician, and bedside nurse.  Jones Broom. [place], MSW, [place]  01/27/2018 12:20 PM

## 2018-01-27 NOTE — Progress Notes (Signed)
[facility]) hospital liaison note.  Received request from Kathrin Greathouse, [place] for family interest in [facility] with request for transfer as soon as possible. Chart reviewed and eligibility confirmed. Met with family to confirm interest and explain services. Family agreeable to transfer tomorrow morning 01/28/2018. Evette Cristal, CSW aware.  Registration paper work completed. Dr. Orpah Melter to assume care per family request.  Please fax discharge summary to [phone]. RN please call report to [phone]. Please arrange transport for patient to arrive at 10:00 AM or after.   Please call with any hospice related questions.  Thank you,  Farrel Gordon, RN, [facility] Liaison  [place] are on AMION

## 2018-01-27 NOTE — Consult Note (Signed)
Faith: Met with pt daughter and sister at 10:30am to discuss hospice services and criteria for inpatient vs homecare admission. Family are very concerned about moving her to [facility], asking if [facility] would provide daytime caregivers. Made aware that we do not but she would receive ATC care at hospice home if she meets criteria.  They need time to think about discussion and options discussed today. Likely that pt meets GIP level of care at [facility] and made aware that bed available if they decide to transfer. Phone number to reach liaison directly provided to family. Will continue to follow. Thank you for the opportunity to serve this family. Please call with any questions. Doroteo Glassman, RN [facility] [phone].

## 2018-01-27 NOTE — Progress Notes (Signed)
PMT no charge note:   Chart reviewed, briefly discussed with [facility] liaison Colleague Ms Alford Highland.   At this time, will request repeat evaluation for residential hospice services, suspect ongoing decline, suspect prognosis likely less than 2 weeks.   [place] [facility] Team [phone]

## 2018-01-27 NOTE — Discharge Summary (Addendum)
Physician Discharge Summary  Debbie [place] YTK:[id number] DOB: 03-30-1966 DOA: 01/22/2018  PCP: Patient, No Pcp Per  Admit date: 01/22/2018 Discharge date: 01/28/2018 Admitted From: Home Disposition: [place]: None Equipment/Devices none Discharge Condition: Hospice CODE STATUS DO NOT RESUSCITATE Diet recommendation: Comfort care Brief/Interim Summary:52 y.o., femaleadmitted on 9/30/2019for acute on chronic decompensated liver disease secondary to alcohol abuse, presented to the ED with a Hgb of 4 s/p transfusion and developed hematemesis X3 further during hospital stay. She was intubated to protect airway and subsequently extubated successfully when no further bleeding observed. She was initially admitted to critical care service and then transferred to [facility] service after hemodynamically stabilized.   GI consulted and performed EGD which showed grade D erosive esophagitis; can't exclude underlying esophageal varices; portal hypertensive gastropathy. Pt continued on PPI drip and octreotide drip for 72 hours. S/p 3 units PRBC and Hb elevated from 4.7 to 9.9. No evidence of SBP on diagnostic paracentesis. However given GIB, she's on empiric iv Rocephin. No diuretics due to AKI. She's also on Xifaxan for hepatic encephalopathy. Advanced to clear liquid diet. AFP is normal. Discriminant score<32.   Pt developed SOB on day 3. Repeated CXR showed mild increased diffuse right lung opacity concerning for possible pneumonia or asymmetric edema. BNP is markedly elevated compared with admission level, suspecting volume overload from transfusion. Started on po lasix and aldactone which is also for her ascites. The limiting factor for diuresis is her hypotension with SBP in 80's.   Prognosis is guarded and GI/critical care recommended hospice. Palliative care consulted. She's DNR.     Discharge Diagnoses:  Principal Problem:   Anemia due to GI blood loss Active Problems:   ETOH abuse    Tobacco use disorder   Protein-calorie malnutrition, severe   Gastrointestinal hemorrhage with hematemesis   Hyperbilirubinemia   Metabolic acidosis, normal anion gap (NAG)   Pressure injury of skin   Encounter for palliative care   Goals of care, counseling/discussion   Erosive esophagitis   Portal hypertensive gastropathy (HCC)   Alcoholic cirrhosis of liver with ascites (HCC)   NSVT (nonsustained ventricular tachycardia) (HCC)   Decompensated hepatic cirrhosis (HCC)   Abnormal CXR   Volume overload   Hypotension due to drugs   Abdominal pain 1] decompensated alcoholic cirrhosis with portal hypertension and ascites and anemia due to hematemesis-EGD findings consistent with severe erosive esophagitis and portal hypertensive gastropathy.  She received multiple units of blood transfusion.  She is status post ventilator dependent respiratory failure.  Patient is very frail with ongoing active decline with no p.o. intake and very low albumin.  Patient's family have decided patient to be comfort care which is appropriate in this situation.  Her prognosis is guarded and possibly less than 2 weeks.  She will be discharged to [facility].   Discharge Instructions  Discharge Instructions    Diet - low sodium heart healthy   Complete by:  As directed    Increase activity slowly   Complete by:  As directed      Allergies as of 01/27/2018   No Known Allergies     Medication List    STOP taking these medications   AMBULATORY NON FORMULARY MEDICATION   folic acid 1 MG tablet Commonly known as:  FOLVITE   ibuprofen 200 MG tablet Commonly known as:  ADVIL,MOTRIN   pantoprazole 40 MG tablet Commonly known as:  PROTONIX   Potassium Chloride ER 20 MEQ Tbcr   prednisoLONE acetate 1 %  ophthalmic suspension Commonly known as:  PRED FORTE   spironolactone 25 MG tablet Commonly known as:  ALDACTONE   STIOLTO RESPIMAT 2.5-2.5 MCG/ACT Aers Generic drug:  Tiotropium  Bromide-Olodaterol   trimethoprim-polymyxin b ophthalmic solution Commonly known as:  POLYTRIM       No Known Allergies  Consultations:  Palliative care, critical care, GI   Procedures/Studies: Dg Abd 1 View  Result Date: 01/23/2018 CLINICAL DATA:  Hematemesis, anemia EXAM: ABDOMEN - 1 VIEW COMPARISON:  CT abdomen/pelvis dated 11/09/2017 FINDINGS: Nonobstructive bowel gas pattern. Mild degenerative changes of the lower thoracic spine. IMPRESSION: Unremarkable abdominal radiograph. Electronically Signed   By: Julian Hy M.D.   On: 01/23/2018 01:09   US Abdomen Complete  Result Date: 01/22/2018 CLINICAL DATA:  Bilirubinemia. Alcoholism, polysubstance abuse. Hepatitis-C EXAM: ABDOMEN ULTRASOUND COMPLETE COMPARISON:  CT abdomen pelvis 11/09/2017 FINDINGS: Gallbladder: Gallbladder sludge with small gallstones, 4 mm. Gallbladder wall mildly thickened 3.1 mm likely due to ascites. Negative sonographic Murphy sign. Common bile duct: Diameter: 4 mm Liver: Diffusely hyperechoic liver parenchyma without focal lesion. Portal vein is patent on color Doppler imaging with normal direction of blood flow towards the liver. IVC: No abnormality visualized. Pancreas: Visualized portion unremarkable. Spleen: Size and appearance within normal limits. Right Kidney: Length: 10.6 cm. Echogenicity within normal limits. No mass or hydronephrosis visualized. Left Kidney: Length: 10.4 cm. Echogenicity within normal limits. No mass or hydronephrosis visualized. Abdominal aorta: No aneurysm visualized. Other findings: Moderate ascites. IMPRESSION: Gallbladder sludge and small gallstones.  No biliary dilatation. Moderate ascites Electronically Signed   By: Franchot Gallo M.D.   On: 01/22/2018 18:30   US Paracentesis  Result Date: 01/23/2018 INDICATION: Patient with alcoholic cirrhosis, ascites. Request is made for diagnostic paracentesis with 500 mL maximum to be withdrawn from abdomen today. EXAM: ULTRASOUND  GUIDED DIAGNOSTIC PARACENTESIS MEDICATIONS: 10 mL 1% lidocaine COMPLICATIONS: None immediate. PROCEDURE: Informed written consent was obtained from the patient after a discussion of the risks, benefits and alternatives to treatment. A timeout was performed prior to the initiation of the procedure. Initial ultrasound scanning demonstrates a moderate amount of ascites within the right lower abdominal quadrant. The right lower abdomen was prepped and draped in the usual sterile fashion. 1% lidocaine was used for local anesthesia. Following this, a 19 gauge, 7-cm, Yueh catheter was introduced. An ultrasound image was saved for documentation purposes. The paracentesis was performed. The catheter was removed and a dressing was applied. The patient tolerated the procedure well without immediate post procedural complication. FINDINGS: A total of approximately 500 mL of yellow fluid was removed. Samples were sent to the laboratory as requested by the clinical team. IMPRESSION: Successful ultrasound-guided paracentesis yielding 500 mL of peritoneal fluid. Read by: Brynda Greathouse PA-C Electronically Signed   By: Marybelle Killings M.D.   On: 01/23/2018 13:39   US Paracentesis  Result Date: 12/29/2017 INDICATION: Patient with history of hepatitis-C, early cirrhosis by imaging, ascites. Request made for diagnostic and therapeutic paracentesis. EXAM: ULTRASOUND GUIDED DIAGNOSTIC AND THERAPEUTIC PARACENTESIS MEDICATIONS: None COMPLICATIONS: None immediate. PROCEDURE: Informed written consent was obtained from the patient after a discussion of the risks, benefits and alternatives to treatment. A timeout was performed prior to the initiation of the procedure. Initial ultrasound scanning demonstrates a small to moderate amount of ascites within the left mid to lower abdominal quadrant. The left mid to lower abdomen was prepped and draped in the usual sterile fashion. 1% lidocaine was used for local anesthesia. Following this, a 19  gauge, 7-cm, Teressa Lower  catheter was introduced. An ultrasound image was saved for documentation purposes. The paracentesis was performed. The catheter was removed and a dressing was applied. The patient tolerated the procedure well without immediate post procedural complication. FINDINGS: A total of approximately 1.7 liters of clear, yellow fluid was removed. Samples were sent to the laboratory as requested by the clinical team. IMPRESSION: Successful ultrasound-guided diagnostic and therapeutic paracentesis yielding 1.7 liters of peritoneal fluid. Read by: Rowe Robert, PA-C Electronically Signed   By: Corrie Mckusick D.O.   On: 12/29/2017 11:36   Dg Chest Port 1 View  Result Date: 01/25/2018 CLINICAL DATA:  Dyspnea. EXAM: PORTABLE CHEST 1 VIEW COMPARISON:  Radiograph of January 23, 2018. FINDINGS: The heart size and mediastinal contours are within normal limits. Endotracheal tube has been removed. No pneumothorax is noted. Mildly increased diffuse right lung opacity is noted concerning for possible pneumonia or asymmetric edema, most prominently seen in right upper lobe. Minimal right pleural effusion is noted. The visualized skeletal structures are unremarkable. IMPRESSION: Mildly increased diffuse right lung opacity is noted concerning for possible pneumonia or asymmetric edema, with minimal right pleural effusion. Electronically Signed   By: Marijo Conception, M.D.   On: 01/25/2018 14:28   Dg Chest Port 1 View  Result Date: 01/23/2018 CLINICAL DATA:  Endotracheal tube placement. EXAM: PORTABLE CHEST 1 VIEW COMPARISON:  10/09/2017. FINDINGS: Endotracheal tube noted with tip 4.0 cm above the carina. Heart size normal. Low lung volumes with mild bibasilar atelectasis/infiltrates. No pleural effusion or pneumothorax. IMPRESSION: 1.  Endotracheal tube noted with tip 4.0 cm above the carina. 2.  Low lung volumes with mild bibasilar atelectasis/infiltrates. Electronically Signed   By: Marcello Moores  Register   On: 01/23/2018  12:49    (Echo, Carotid, EGD, Colonoscopy, ERCP)    Subjective:   Discharge Exam: Vitals:   01/27/18 0555 01/27/18 0845  BP: (!) 84/55 (!) 124/112  Pulse: 89 (!) 105  Resp:    Temp:    SpO2: 95% 93%   Vitals:   01/26/18 2000 01/26/18 2125 01/27/18 0555 01/27/18 0845  BP: 95/78 91/73 (!) 84/55 (!) 124/112  Pulse: 100 (!) 106 89 (!) 105  Resp: (!) 33 (!) 22    Temp: 99.7 F (37.6 C) 98.5 F (36.9 C)    TempSrc: Oral Oral    SpO2: 93% 95% 95% 93%  Weight:  50.4 kg    Height:  5\' 7"  (1.702 m)      General: Pt is alert, awake, not in acute distress Cardiovascular: RRR, S1/S2 +, no rubs, no gallops Respiratory: CTA bilaterally, no wheezing, no rhonchi Abdominal: Soft, NT, ND, bowel sounds + Extremities: no edema, no cyanosis    The results of significant diagnostics from this hospitalization (including imaging, microbiology, ancillary and laboratory) are listed below for reference.     Microbiology: Recent Results (from the past 240 hour(s))  Culture, body fluid-bottle     Status: None (Preliminary result)   Collection Time: 01/23/18  1:02 PM  Result Value Ref Range Status   Specimen Description PERITONEAL  Final   Special Requests NONE  Final   Culture   Final    NO GROWTH 3 DAYS Performed at [facility], [address], [place], [place] [address]    Report Status PENDING  Incomplete  Gram stain     Status: None   Collection Time: 01/23/18  1:02 PM  Result Value Ref Range Status   Specimen Description PERITONEAL  Final   Special Requests  NONE  Final   Gram Stain   Final    RARE WBC PRESENT, PREDOMINANTLY MONONUCLEAR NO ORGANISMS SEEN Performed at [facility], [address], [place], [place] [address]    Report Status 01/23/2018 FINAL  Final  MRSA PCR Screening     Status: None   Collection Time: 01/24/18 12:15 PM  Result Value Ref Range Status   MRSA by PCR NEGATIVE NEGATIVE Final    Comment:        The GeneXpert MRSA Assay  (FDA approved for NASAL specimens only), is one component of a comprehensive MRSA colonization surveillance program. It is not intended to diagnose MRSA infection nor to guide or monitor treatment for MRSA infections. Performed at [facility], [place] [address], [place],  [address]      Labs: BNP (last 3 results) Recent Labs    01/22/18 1403 01/26/18 0642  BNP 75.2 9,390.3*   Basic Metabolic Panel: Recent Labs  Lab 01/22/18 1403 01/22/18 1647 01/23/18 0439 01/24/18 0806 01/26/18 0341 01/27/18 1040  NA 127*  --  132* 138 136 139  K 2.9*  --  3.3* 3.9 3.5 4.4  CL 93*  --  103 109 109 112*  CO2 20*  --  16* 16* 15* 17*  GLUCOSE 87  --  103* 93 132* 128*  BUN 27*  --  26* 25* 16 17  CREATININE 1.86*  --  1.39* 1.36* 0.94 1.22*  CALCIUM 7.6*  --  6.7* 6.6* 7.0* 7.6*  MG  --  1.5*  --  1.8  --  1.3*  PHOS 2.6  --   --   --   --   --    Liver Function Tests: Recent Labs  Lab 01/22/18 1403 01/22/18 1647 01/23/18 0439 01/24/18 0806 01/26/18 0341 01/27/18 1040  AST 46*  --  40 53* 73* 75*  ALT 18  --  17 21 29  34  ALKPHOS 96  --  75 72 79 77  BILITOT 12.2* 12.1* 12.1* 12.9* 12.9* 13.3*  PROT 5.4*  --  4.8* 5.0* 5.4* 5.3*  ALBUMIN 1.9*  --  1.7* 1.7* 1.8* 1.8*   Recent Labs  Lab 01/22/18 1403  LIPASE 31   Recent Labs  Lab 01/22/18 1902  AMMONIA 67*   CBC: Recent Labs  Lab 01/22/18 1403 01/23/18 0439  01/24/18 0806  01/25/18 1859 01/26/18 0341 01/26/18 0648 01/26/18 1844 01/27/18 1040  WBC 9.5 7.8  --  6.2  --   --  8.6  --   --  12.7*  NEUTROABS 7.5  --   --  5.0  --   --   --   --   --   --   HGB 4.7* 8.0*   < > 9.9*   < > 9.9* 11.0* 10.2* 10.9* 10.2*  HCT 13.3* 22.3*   < > 27.7*   < > 27.6* 31.3* 28.3* 31.3* 29.0*  MCV 89.9 84.5  --  84.2  --   --  84.6  --   --  83.3  PLT 164 106*  --  79*  --   --  73*  --   --  116*   < > = values in this interval not displayed.   Cardiac Enzymes: Recent Labs  Lab 01/22/18 1403   TROPONINI <0.03   BNP: Invalid input(s): POCBNP CBG: No results for input(s): GLUCAP in the last 168 hours. D-Dimer No results for input(s): DDIMER in the last 72  hours. Hgb A1c No results for input(s): HGBA1C in the last 72 hours. Lipid Profile No results for input(s): CHOL, HDL, LDLCALC, TRIG, CHOLHDL, LDLDIRECT in the last 72 hours. Thyroid function studies No results for input(s): TSH, T4TOTAL, T3FREE, THYROIDAB in the last 72 hours.  Invalid input(s): FREET3 Anemia work up No results for input(s): VITAMINB12, FOLATE, FERRITIN, TIBC, IRON, RETICCTPCT in the last 72 hours. Urinalysis    Component Value Date/Time   COLORURINE AMBER (A) 11/09/2017 0050   APPEARANCEUR CLOUDY (A) 11/09/2017 0050   LABSPEC 1.019 11/09/2017 0050   PHURINE 5.0 11/09/2017 0050   GLUCOSEU NEGATIVE 11/09/2017 0050   HGBUR NEGATIVE 11/09/2017 0050   BILIRUBINUR NEGATIVE 11/09/2017 0050   KETONESUR NEGATIVE 11/09/2017 0050   PROTEINUR 30 (A) 11/09/2017 0050   UROBILINOGEN 0.2 01/27/2014 2000   NITRITE POSITIVE (A) 11/09/2017 0050   LEUKOCYTESUR TRACE (A) 11/09/2017 0050   Sepsis Labs Invalid input(s): PROCALCITONIN,  WBC,  LACTICIDVEN Microbiology Recent Results (from the past 240 hour(s))  Culture, body fluid-bottle     Status: None (Preliminary result)   Collection Time: 01/23/18  1:02 PM  Result Value Ref Range Status   Specimen Description PERITONEAL  Final   Special Requests NONE  Final   Culture   Final    NO GROWTH 3 DAYS Performed at [facility], [address], [place], [place] [address]    Report Status PENDING  Incomplete  Gram stain     Status: None   Collection Time: 01/23/18  1:02 PM  Result Value Ref Range Status   Specimen Description PERITONEAL  Final   Special Requests NONE  Final   Gram Stain   Final    RARE WBC PRESENT, PREDOMINANTLY MONONUCLEAR NO ORGANISMS SEEN Performed at [facility], [address], [place], [place] [address]    Report Status  01/23/2018 FINAL  Final  MRSA PCR Screening     Status: None   Collection Time: 01/24/18 12:15 PM  Result Value Ref Range Status   MRSA by PCR NEGATIVE NEGATIVE Final    Comment:        The GeneXpert MRSA Assay (FDA approved for NASAL specimens only), is one component of a comprehensive MRSA colonization surveillance program. It is not intended to diagnose MRSA infection nor to guide or monitor treatment for MRSA infections. Performed at [facility], [place] [address], [place], [place] [address]      Time coordinating discharge:  35 minutes  SIGNED:   Georgette Shell, MD  [facility] 01/27/2018, 1:26 PM Pager   If 7PM-7AM, please contact night-coverage [email or web address] Password [email or web address]

## 2018-01-28 LAB — CULTURE, BODY FLUID W GRAM STAIN -BOTTLE: Culture: NO GROWTH

## 2018-01-28 LAB — CULTURE, BODY FLUID-BOTTLE

## 2018-01-28 NOTE — Progress Notes (Signed)
Report given to Shirlee More RN [phone] at [facility]. Patient currently  stable and being transferred by EMS.

## 2018-01-28 NOTE — Progress Notes (Signed)
Discharge summary faxed to [facility], patient may transport after 10am. [facility] called and informed of patient readiness after 10am. Patient's packet will be in chart.    RN please call report to [phone].   Stephanie Acre, [place] Social Worker [phone]

## 2018-02-01 ENCOUNTER — Ambulatory Visit: Payer: Self-pay | Admitting: Gastroenterology

## 2018-02-23 DEATH — deceased

## 2019-01-05 IMAGING — CT CT ABD-PELV W/ CM
2 of 5 series · 17 of 46 positions shown, 19 images · IV contrast (iopamidol)
Comparison: Ultrasound 09/30/2015, CT 05/31/2015

CLINICAL DATA: Left-sided abdominal pain for 3 months

EXAM:
CT ABDOMEN AND PELVIS WITH CONTRAST
TECHNIQUE: Multidetector CT imaging of the abdomen and pelvis was performed
using the standard protocol following bolus administration of
intravenous contrast.
CONTRAST:  100mL [id number] IOPAMIDOL (RD8JOD-JGG) INJECTION 61%

[Series 2: abd/pel with · axial · 0.67mm/px · z∈[+1142,+1492]mm · 14 of 80 slices shown, 16 images]
[im 5/80  soft-tissue]
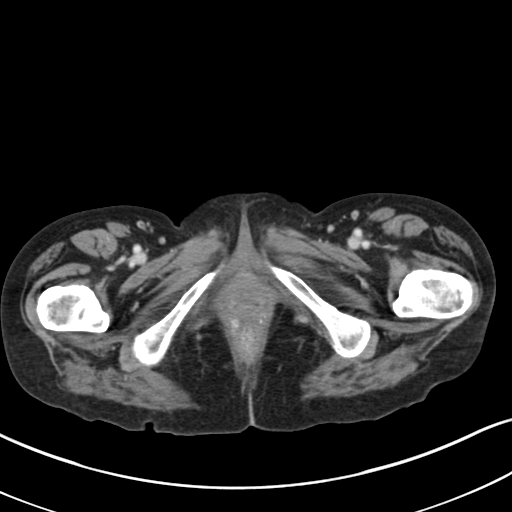
[im 5/80  bone]
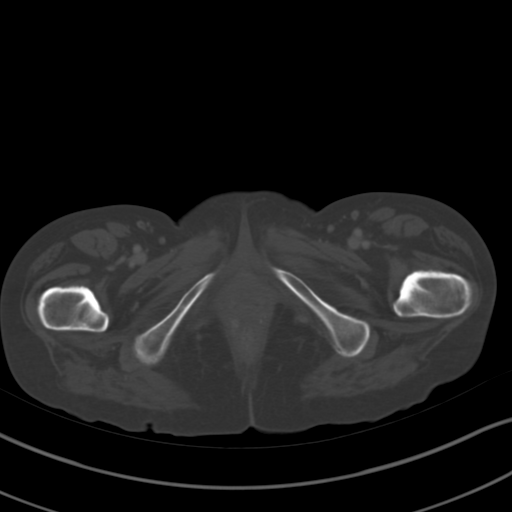
[im 10/80  soft-tissue]
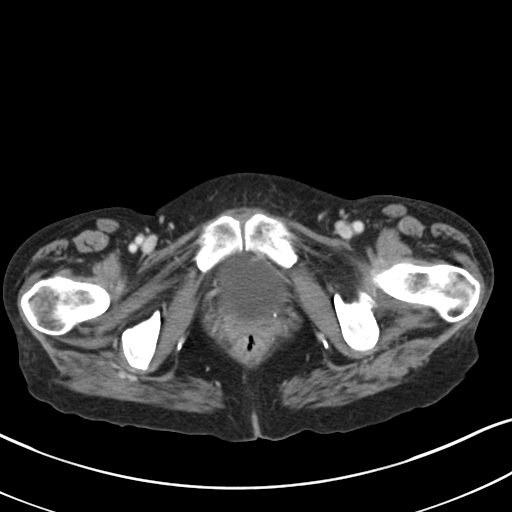
[im 15/80  soft-tissue]
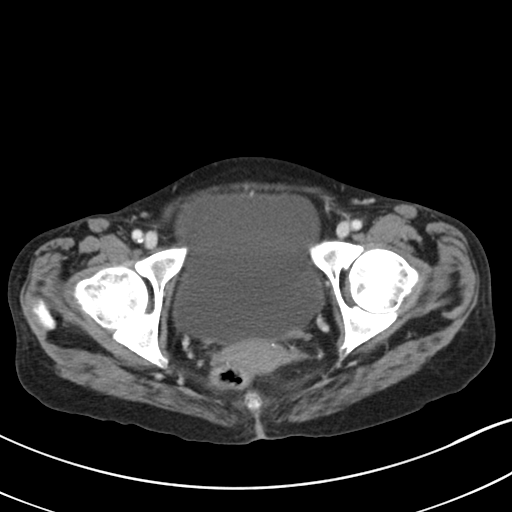
[im 20/80  soft-tissue]
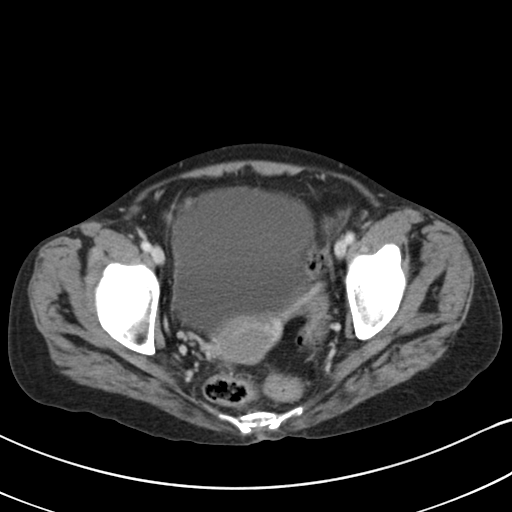
[im 25/80  soft-tissue]
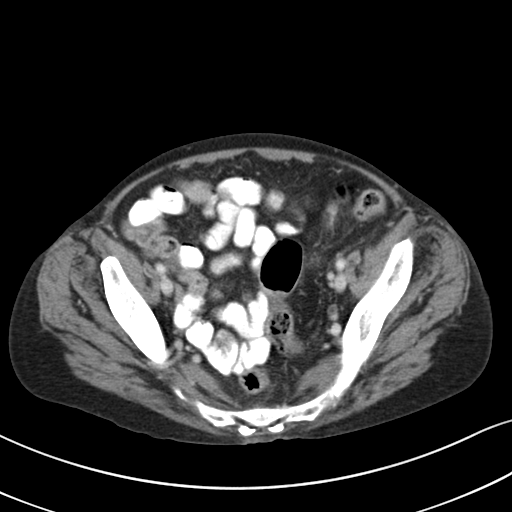
[im 30/80  soft-tissue]
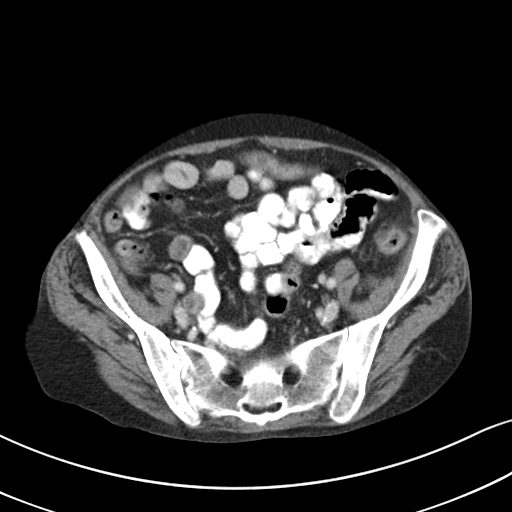
[im 35/80  soft-tissue]
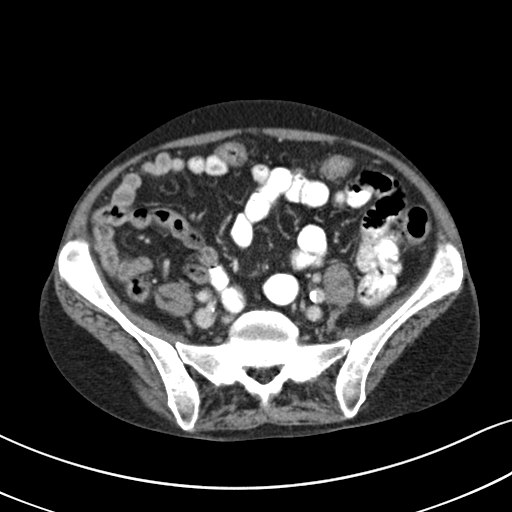
[im 45/80  soft-tissue]
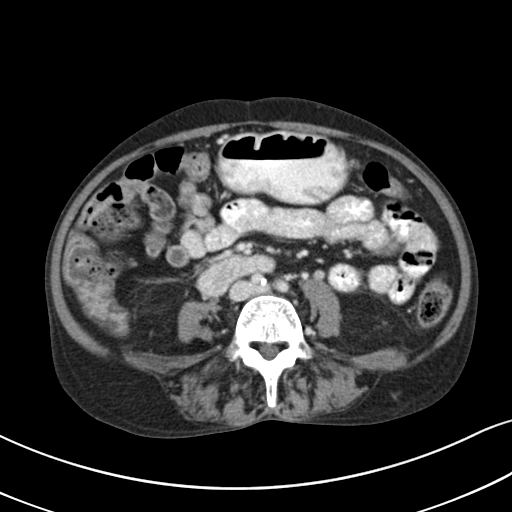
[im 50/80  soft-tissue]
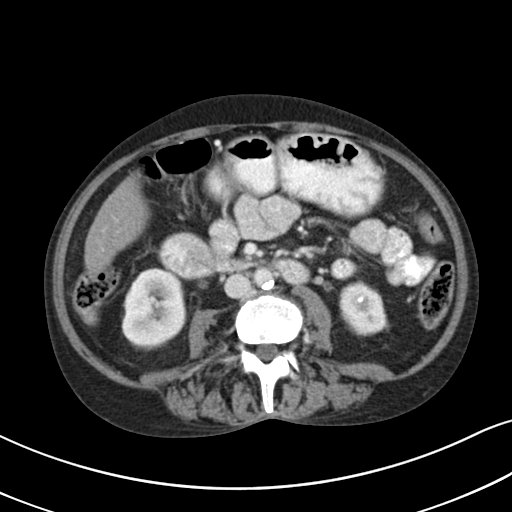
[im 50/80  bone]
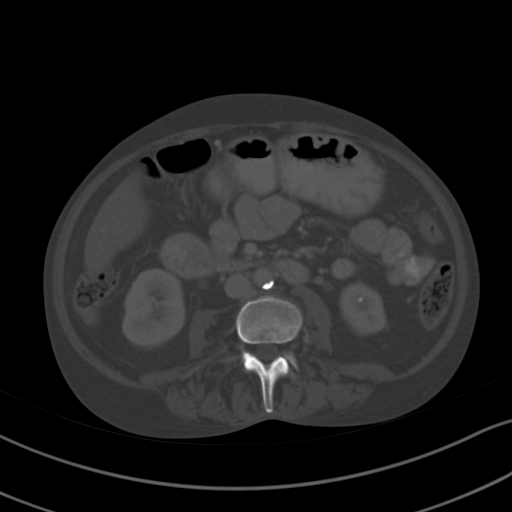
[im 55/80  soft-tissue]
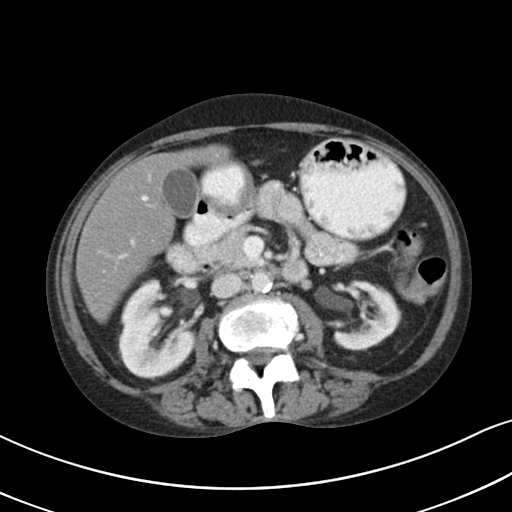
[im 60/80  soft-tissue]
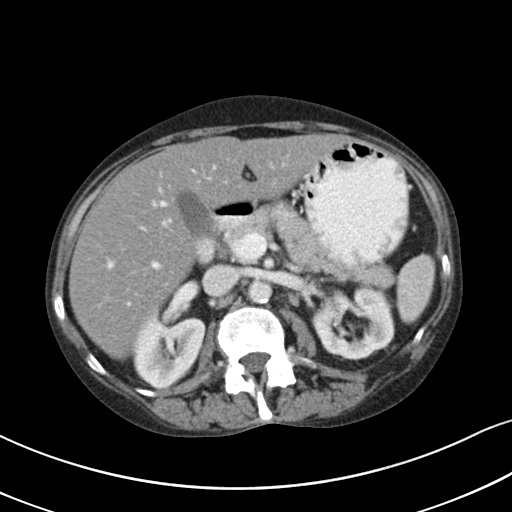
[im 65/80  soft-tissue]
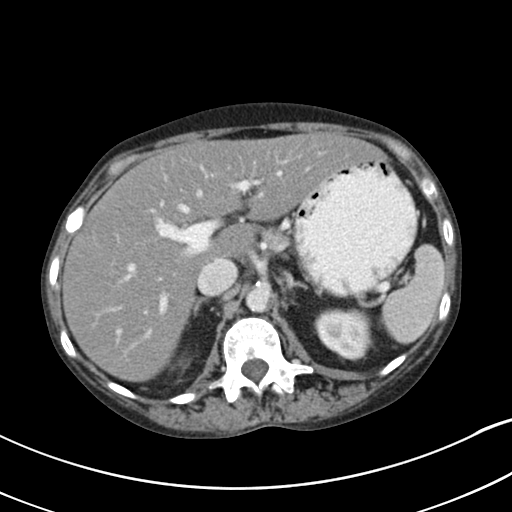
[im 70/80  soft-tissue]
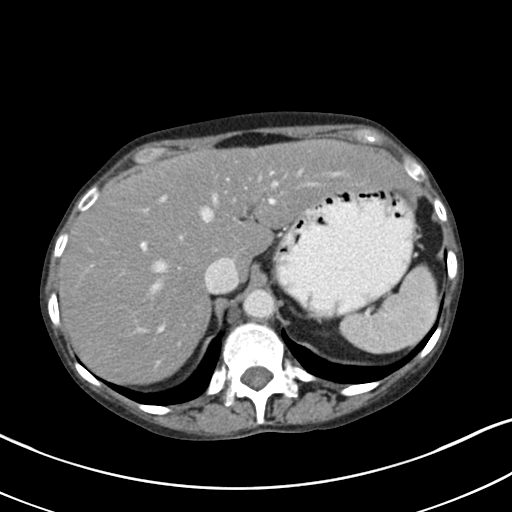
[im 75/80  soft-tissue]
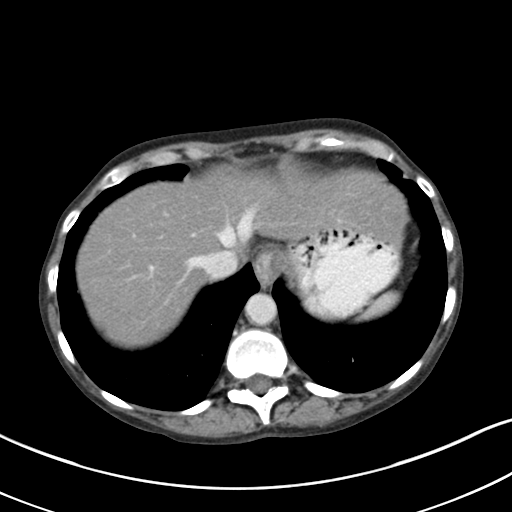

[Series 5: coronal a/|p · coronal · 0.75mm/px · 3 of 123 slices shown]
[im 41/123  soft-tissue]
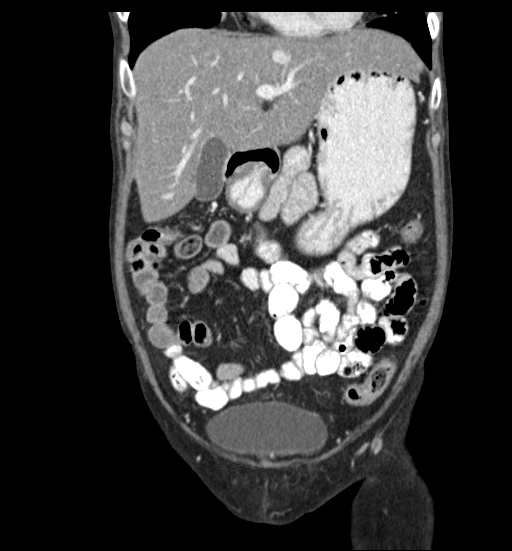
[im 55/123  soft-tissue]
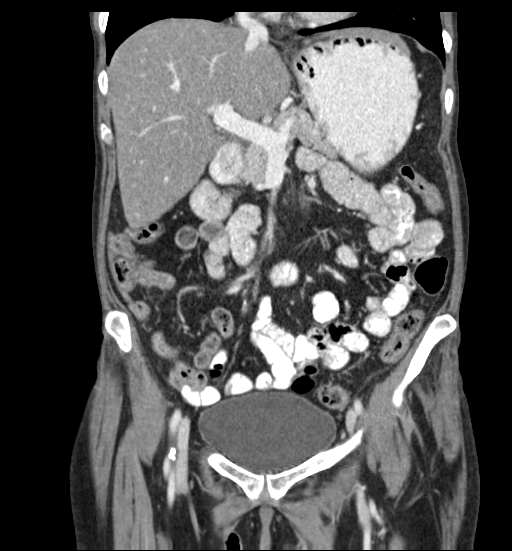
[im 68/123  soft-tissue]
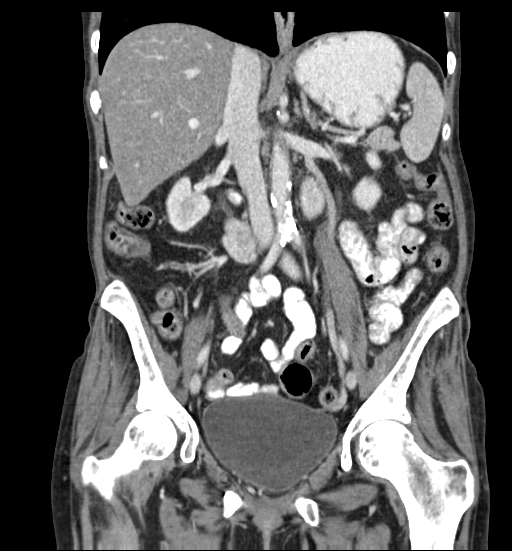

[17 of 46 positions shown; findings below may reference images not displayed]

FINDINGS: Lower chest: Small focus of atelectasis small focus of atelectasis
in the lingula. Normal heart size.

Hepatobiliary: No focal liver abnormality is seen. No gallstones,
gallbladder wall thickening, or biliary dilatation.

Pancreas: Unremarkable. No pancreatic ductal dilatation or
surrounding inflammatory changes.

Spleen: Normal in size without focal abnormality.

Adrenals/Urinary Tract: Adrenal glands are within normal limits.
Punctate 3 mm stone lower pole left kidney. Mild cortical thinning
and scarring lower pole left kidney as before. Prominent renal
pelvises and ureters but no ureteral stone is seen. Bladder is
dilated.

Stomach/Bowel: Stomach is within normal limits. Appendix appears
normal. No evidence of bowel wall thickening, distention, or
inflammatory changes.

Vascular/Lymphatic: Aortic atherosclerosis. No enlarged abdominal or
pelvic lymph nodes.

Reproductive: Uterus and bilateral adnexa are unremarkable.

Other: Negative for free air or free fluid.

Musculoskeletal: Degenerative changes. No acute or suspicious bone
lesion.
IMPRESSION: 1. No CT evidence for acute intra-abdominal or pelvic pathology
2. Nonobstructing stone in the lower left kidney
